# Patient Record
Sex: Male | Born: 1950
Health system: Southern US, Community
[De-identification: ages and names within clinical notes are randomized; demographics above are authoritative.]

## PROBLEM LIST (undated history)

## (undated) DIAGNOSIS — F329 Major depressive disorder, single episode, unspecified: Secondary | ICD-10-CM

## (undated) DIAGNOSIS — F32A Depression, unspecified: Secondary | ICD-10-CM

## (undated) DIAGNOSIS — E785 Hyperlipidemia, unspecified: Secondary | ICD-10-CM

## (undated) DIAGNOSIS — I10 Essential (primary) hypertension: Secondary | ICD-10-CM

## (undated) DIAGNOSIS — C801 Malignant (primary) neoplasm, unspecified: Secondary | ICD-10-CM

## (undated) DIAGNOSIS — E119 Type 2 diabetes mellitus without complications: Secondary | ICD-10-CM

## (undated) HISTORY — DX: Type 2 diabetes mellitus without complications: E11.9

## (undated) HISTORY — DX: Hyperlipidemia, unspecified: E78.5

## (undated) HISTORY — DX: Essential (primary) hypertension: I10

## (undated) HISTORY — DX: Major depressive disorder, single episode, unspecified: F32.9

## (undated) HISTORY — DX: Depression, unspecified: F32.A

## (undated) HISTORY — PX: PROSTATE SURGERY: SHX751

## (undated) HISTORY — DX: Malignant (primary) neoplasm, unspecified: C80.1

---
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
## 2005-05-30 ENCOUNTER — Encounter: Admission: RE | Admit: 2005-05-30 | Discharge: 2005-05-30 | Payer: Self-pay | Admitting: *Deleted

## 2008-01-02 LAB — HM COLONOSCOPY: HM Colonoscopy: NORMAL

## 2008-09-16 ENCOUNTER — Ambulatory Visit: Admission: RE | Admit: 2008-09-16 | Discharge: 2008-11-20 | Payer: Self-pay | Admitting: Radiation Oncology

## 2008-12-11 ENCOUNTER — Encounter (INDEPENDENT_AMBULATORY_CARE_PROVIDER_SITE_OTHER): Payer: Self-pay | Admitting: Urology

## 2008-12-11 ENCOUNTER — Inpatient Hospital Stay (HOSPITAL_COMMUNITY): Admission: RE | Admit: 2008-12-11 | Discharge: 2008-12-12 | Payer: Self-pay | Admitting: Urology

## 2010-01-12 ENCOUNTER — Ambulatory Visit: Payer: Self-pay | Admitting: Internal Medicine

## 2010-01-12 DIAGNOSIS — F329 Major depressive disorder, single episode, unspecified: Secondary | ICD-10-CM

## 2010-01-12 DIAGNOSIS — Z8546 Personal history of malignant neoplasm of prostate: Secondary | ICD-10-CM | POA: Insufficient documentation

## 2010-01-12 DIAGNOSIS — E785 Hyperlipidemia, unspecified: Secondary | ICD-10-CM

## 2010-01-12 DIAGNOSIS — I10 Essential (primary) hypertension: Secondary | ICD-10-CM

## 2010-01-12 DIAGNOSIS — F3289 Other specified depressive episodes: Secondary | ICD-10-CM | POA: Insufficient documentation

## 2010-01-12 LAB — CONVERTED CEMR LAB
Albumin: 3.8 g/dL (ref 3.5–5.2)
BUN: 19 mg/dL (ref 6–23)
Basophils Absolute: 0 10*3/uL (ref 0.0–0.1)
Basophils Relative: 0.6 % (ref 0.0–3.0)
Bilirubin, Direct: 0.1 mg/dL (ref 0.0–0.3)
Calcium: 9.7 mg/dL (ref 8.4–10.5)
Cholesterol: 241 mg/dL — ABNORMAL HIGH (ref 0–200)
Creatinine, Ser: 1 mg/dL (ref 0.4–1.5)
Eosinophils Relative: 2.4 % (ref 0.0–5.0)
GFR calc non Af Amer: 83.13 mL/min (ref >60)
Glucose, Bld: 103 mg/dL — ABNORMAL HIGH (ref 70–99)
HCT: 48.5 % (ref 39.0–52.0)
Lymphocytes Relative: 23.7 % (ref 12.0–46.0)
MCHC: 35.1 g/dL (ref 30.0–36.0)
Neutrophils Relative %: 67.5 % (ref 43.0–77.0)
Platelets: 191 10*3/uL (ref 150.0–400.0)
Total Bilirubin: 1.1 mg/dL (ref 0.3–1.2)
Total CHOL/HDL Ratio: 5
Total Protein: 6.8 g/dL (ref 6.0–8.3)
VLDL: 37.8 mg/dL (ref 0.0–40.0)

## 2010-01-13 ENCOUNTER — Encounter: Payer: Self-pay | Admitting: Internal Medicine

## 2010-04-13 NOTE — Letter (Signed)
Summary: Results Follow-up Letter  [facility]  [address] [place], [place] [address]   Phone: [phone]  Fax: [phone]    01/13/2010  [address] [place], [place]  [address]  Dear Mr. RAN,   The following are the results of your recent test(s):  Test     Result     Blood sugar     very slightly elevated Liver/kidney   normal CBC       normal Thyroid     normal   _________________________________________________________  Please call for an appointment as directed _________________________________________________________ _________________________________________________________ _________________________________________________________  Sincerely,  Sanda Linger MD [facility]

## 2010-04-13 NOTE — Assessment & Plan Note (Signed)
Summary: New / [employer] [facility] / # / cd   Vital Signs:  Patient profile:   60 year old male Height:      70 inches Weight:      202.50 pounds BMI:     29.16 O2 Sat:      97 % on Room air Temp:     98.2 degrees F oral Pulse rate:   80 / minute Pulse rhythm:   regular Resp:     16 per minute BP sitting:   116 / 78  (left arm) Cuff size:   large  Vitals Entered By: Rock Nephew CMA (January 12, 2010 9:17 AM)  Nutrition Counseling: Patient's BMI is greater than 25 and therefore counseled on weight management options.  O2 Flow:  Room air CC: Patient here to establish a PCP, f/up on Htn Is Patient Diabetic? No Pain Assessment Patient in pain? no       Does patient need assistance? Functional Status Self care Ambulation Normal   Primary Care Ilene Witcher:  Etta Grandchild MD  CC:  Patient here to establish a PCP and f/up on Htn.  History of Present Illness:  Follow-Up Visit      This is a 60 year old man who presents for Follow-up visit.  The patient denies chest pain, palpitations, dizziness, syncope, edema, SOB, DOE, PND, and orthopnea.  Since the last visit the patient notes no new problems or concerns.  The patient reports taking meds as prescribed, monitoring BP, and dietary noncompliance.  When questioned about possible medication side effects, the patient notes none.    Preventive Screening-Counseling & Management  Alcohol-Tobacco     Alcohol drinks/day: <1     Alcohol type: beer     >5/day in last 3 mos: no     Alcohol Counseling: not indicated; use of alcohol is not excessive or problematic     Feels need to cut down: no     Feels annoyed by complaints: no     Feels guilty re: drinking: no     Needs 'eye opener' in am: no     Smoking Status: never     Tobacco Counseling: not indicated; no tobacco use  Caffeine-Diet-Exercise     Does Patient Exercise: no     Exercise Counseling: to improve exercise regimen  Hep-HIV-STD-Contraception     Hepatitis Risk: no risk  noted     HIV Risk: no risk noted     STD Risk: no risk noted     Dental Visit-last 6 months yes     Dental Care Counseling: not indicated; dental care within six months     TSE monthly: yes     Testicular SE Education/Counseling to perform regular STE     Sun Exposure-Excessive: no  Safety-Violence-Falls     Seat Belt Use: yes     Helmet Use: n/a     Firearms in the Home: no firearms in the home     Smoke Detectors: yes     Violence in the Home: no risk noted     Sexual Abuse: no      Sexual History:  currently monogamous.        Drug Use:  no.        Blood Transfusions:  no.    Medications Prior to Update: 1)  None  Current Medications (verified): 1)  Diovan Hct 80-12.5 Mg Tabs (Valsartan-Hydrochlorothiazide) .... Take 1 Tablet By Mouth Once A Day  Allergies (verified): No Known Drug Allergies  Past History:  Past Medical History: Prostate cancer, hx of- he saw Dr. Laverle Patter one month ago he says that his exam and PSA were normal. 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  The patient denies anorexia, weight loss, chest pain, syncope, dyspnea on exertion, peripheral edema, prolonged cough, headaches, hemoptysis, abdominal pain, melena, hematochezia, severe indigestion/heartburn, hematuria, suspicious skin lesions, depression, and  testicular masses.    Physical Exam  General:  alert, well-developed, well-nourished, well-hydrated, appropriate dress, normal appearance, healthy-appearing, cooperative to examination, good hygiene, and overweight-appearing.   Head:  normocephalic, atraumatic, no abnormalities observed, no abnormalities palpated, and no alopecia.   Eyes:  vision grossly intact, pupils equal, pupils round, and pupils reactive to light.   Ears:  R ear normal and L ear normal.   Mouth:  Oral mucosa and oropharynx without lesions or exudates.  Teeth in good repair. Neck:  supple, full ROM, no masses, no thyromegaly, no JVD, normal carotid upstroke, no carotid bruits, no cervical lymphadenopathy, and no neck tenderness.   Lungs:  normal respiratory effort, no intercostal retractions, no accessory muscle use, normal breath sounds, no dullness, no fremitus, no crackles, and no wheezes.   Heart:  normal rate, regular rhythm, no murmur, no gallop, and no rub.   Abdomen:  soft, non-tender, normal bowel sounds, no distention, no masses, no guarding, no rigidity, no rebound tenderness, no abdominal hernia, no inguinal hernia, no hepatomegaly, and no splenomegaly.   Rectal:  No external abnormalities noted. Normal sphincter tone. No rectal masses or tenderness. Msk:  No deformity or scoliosis noted of thoracic or lumbar spine.   Pulses:  R and L carotid,radial,femoral,dorsalis pedis and posterior tibial pulses are full and equal bilaterally Extremities:  No clubbing, cyanosis, edema, or deformity noted with normal full range of motion of all joints.   Neurologic:  No cranial nerve deficits noted. Station and gait are normal. Plantar reflexes are down-going bilaterally. DTRs are symmetrical throughout. Sensory, motor and coordinative functions appear intact. Skin:  Intact without suspicious lesions or rashes Cervical Nodes:  no anterior cervical adenopathy and no posterior cervical adenopathy.   Axillary Nodes:  no R axillary  adenopathy and no L axillary adenopathy.   Inguinal Nodes:  no R inguinal adenopathy and no L inguinal adenopathy.   Psych:  Cognition and judgment appear intact. Alert and cooperative with normal attention span and concentration. No apparent delusions, illusions, hallucinations   Impression & Recommendations:  Problem # 1:  HYPERTENSION (ICD-401.9) Assessment Improved  His updated medication list for this problem includes:    Diovan Hct 80-12.5 Mg Tabs (Valsartan-hydrochlorothiazide) .Marland Kitchen... Take 1 tablet by mouth once a day  Orders: Venipuncture ([address]235) TLB-Lipid Panel (80061-LIPID) TLB-BMP (Basic Metabolic Panel-BMET) (80048-METABOL) TLB-CBC Platelet - w/Differential (85025-CBCD) TLB-Hepatic/Liver Function Pnl (80076-HEPATIC) TLB-TSH (Thyroid Stimulating Hormone) (84443-TSH)  BP today: 116/78  Problem # 2:  HYPERLIPIDEMIA (ICD-272.4)  Assessment: Unchanged  Orders: Venipuncture ([address]) TLB-Lipid Panel (80061-LIPID) TLB-BMP (Basic Metabolic Panel-BMET) (80048-METABOL) TLB-CBC Platelet - w/Differential (85025-CBCD) TLB-Hepatic/Liver Function Pnl (80076-HEPATIC) TLB-TSH (Thyroid Stimulating Hormone) (84443-TSH)  Complete Medication List: 1)  Diovan Hct 80-12.5 Mg Tabs (Valsartan-hydrochlorothiazide) .... Take 1 tablet by mouth once a day  Other Orders: Tdap => 74yrs IM (60454) Admin 1st Vaccine (09811)  Colorectal Screening:  Current Recommendations:    Hemoccult: NEG X 1 today  Colonoscopy Results:    Date of Exam: 01/02/2008    Results: Normal  PSA Screening:    Reviewed PSA screening recommendations: patient defers PSA but will reconsider in the future  Immunization & Chemoprophylaxis:    Tetanus vaccine: Tdap  (01/12/2010)  Patient Instructions: 1)  Please schedule a follow-up appointment in 4 months. 2)  It is important that you exercise regularly at least 20 minutes 5 times a week. If you develop chest pain, have severe difficulty breathing, or feel very  tired , stop exercising immediately and seek medical attention. 3)  You need to lose weight. Consider a lower calorie diet and regular exercise.  4)  Check your Blood Pressure regularly. If it is above 140/90: you should make an appointment. Prescriptions: DIOVAN HCT 80-12.5 MG TABS (VALSARTAN-HYDROCHLOROTHIAZIDE) Take 1 tablet by mouth once a day  #90 x 3   Entered and Authorized by:   Etta Grandchild MD   Signed by:   Etta Grandchild MD on 01/12/2010   Method used:   Electronically to        [facility]  [address]  [phone]* (retail)       [address] [place], [place]  [address]       Ph: [phone] or [phone]       Fax: [phone]   RxID:   [id number]    Orders Added: 1)  Venipuncture [38756] 2)  TLB-Lipid Panel [80061-LIPID] 3)  TLB-BMP (Basic Metabolic Panel-BMET) [80048-METABOL] 4)  TLB-CBC Platelet - w/Differential [85025-CBCD] 5)  TLB-Hepatic/Liver Function Pnl [80076-HEPATIC] 6)  TLB-TSH (Thyroid Stimulating Hormone) [84443-TSH] 7)  Tdap => 59yrs IM [90715] 8)  Admin 1st Vaccine [90471] 9)  New Patient Level IV [43329]   Immunizations Administered:  Tetanus Vaccine:    Vaccine Type: Tdap    Site: right deltoid    Mfr: GlaxoSmithKline    Dose: 0.5 ml    Route: IM    Given by: Rock Nephew CMA    Exp. Date: 01/01/2012    Lot #: [id number]    VIS given: 01/30/08 version given January 12, 2010.   Immunizations Administered:  Tetanus Vaccine:    Vaccine Type: Tdap    Site: right deltoid    Mfr: GlaxoSmithKline    Dose: 0.5 ml    Route: IM    Given by: Rock Nephew CMA    Exp. Date: 01/01/2012    Lot #: [id number]    VIS given: 01/30/08 version given January 12, 2010.

## 2010-04-13 NOTE — Letter (Signed)
Summary: Lipid Letter  [facility]  [address] [place], [place] [address]   Phone: [phone]  Fax: [phone]    01/13/2010  [facility] [address] [place], [place]  [address]  Dear Clifford Brown:  We have carefully reviewed your last lipid profile from  and the results are noted below with a summary of recommendations for lipid management.    Cholesterol:       241     Goal: <200   HDL "good" Cholesterol:   32.44     Goal: >40   LDL "bad" Cholesterol:   163     Goal: <130   Triglycerides:       189.0     Goal: <150        TLC Diet (Therapeutic Lifestyle Change): Saturated Fats & Transfatty acids should be kept < 7% of total calories ***Reduce Saturated Fats Polyunstaurated Fat can be up to 10% of total calories Monounsaturated Fat Fat can be up to 20% of total calories Total Fat should be no greater than 25-35% of total calories Carbohydrates should be 50-60% of total calories Protein should be approximately 15% of total calories Fiber should be at least 20-30 grams a day ***Increased fiber may help lower LDL Total Cholesterol should be < 200mg /day Consider adding plant stanol/sterols to diet (example: Benacol spread) ***A higher intake of unsaturated fat may reduce Triglycerides and Increase HDL    Adjunctive Measures (may lower LIPIDS and reduce risk of Heart Attack) include: Aerobic Exercise (20-30 minutes 3-4 times a week) Limit Alcohol Consumption Weight Reduction Aspirin 75-81 mg a day by mouth (if not allergic or contraindicated) Dietary Fiber 20-30 grams a day by mouth     Current Medications: 1)    Diovan Hct 80-12.5 Mg Tabs (Valsartan-hydrochlorothiazide) .... Take 1 tablet by mouth once a day  If you have any questions, please call. We appreciate being able to work with you.   Sincerely,    [facility] Etta Grandchild MD

## 2010-06-17 LAB — HEMOGLOBIN AND HEMATOCRIT, BLOOD
HCT: 35 % — ABNORMAL LOW (ref 39.0–52.0)
Hemoglobin: 11.9 g/dL — ABNORMAL LOW (ref 13.0–17.0)
Hemoglobin: 12.2 g/dL — ABNORMAL LOW (ref 13.0–17.0)

## 2010-06-17 LAB — GLUCOSE, CAPILLARY
Glucose-Capillary: 124 mg/dL — ABNORMAL HIGH (ref 70–99)
Glucose-Capillary: 144 mg/dL — ABNORMAL HIGH (ref 70–99)
Glucose-Capillary: 150 mg/dL — ABNORMAL HIGH (ref 70–99)
Glucose-Capillary: 178 mg/dL — ABNORMAL HIGH (ref 70–99)

## 2010-06-18 LAB — TYPE AND SCREEN
ABO/RH(D): O POS
Antibody Screen: NEGATIVE

## 2010-06-18 LAB — GLUCOSE, CAPILLARY: Glucose-Capillary: 117 mg/dL — ABNORMAL HIGH (ref 70–99)

## 2010-06-18 LAB — CBC
HCT: 44.4 % (ref 39.0–52.0)
MCHC: 34.2 g/dL (ref 30.0–36.0)
RDW: 12.6 % (ref 11.5–15.5)

## 2010-06-18 LAB — BASIC METABOLIC PANEL
CO2: 28 mEq/L (ref 19–32)
Chloride: 104 mEq/L (ref 96–112)
GFR calc Af Amer: >60 mL/min (ref >60)
Glucose, Bld: 119 mg/dL — ABNORMAL HIGH (ref 70–99)
Sodium: 140 mEq/L (ref 135–145)

## 2010-06-18 LAB — ABO/RH: ABO/RH(D): O POS

## 2011-01-25 ENCOUNTER — Other Ambulatory Visit: Payer: Self-pay | Admitting: Internal Medicine

## 2011-01-26 ENCOUNTER — Telehealth: Payer: Self-pay

## 2011-01-26 MED ORDER — VALSARTAN-HYDROCHLOROTHIAZIDE 80-12.5 MG PO TABS: 1.0000 | ORAL_TABLET | Freq: Every day | ORAL | Status: DC

## 2011-01-26 NOTE — Telephone Encounter (Signed)
Refill request

## 2011-01-31 ENCOUNTER — Ambulatory Visit (INDEPENDENT_AMBULATORY_CARE_PROVIDER_SITE_OTHER): Payer: 59 | Admitting: Internal Medicine

## 2011-01-31 ENCOUNTER — Encounter: Payer: Self-pay | Admitting: Internal Medicine

## 2011-01-31 ENCOUNTER — Other Ambulatory Visit (INDEPENDENT_AMBULATORY_CARE_PROVIDER_SITE_OTHER): Payer: 59

## 2011-01-31 ENCOUNTER — Other Ambulatory Visit: Payer: Self-pay | Admitting: Internal Medicine

## 2011-01-31 VITALS — BP 118/80 | HR 77 | Temp 97.7°F | Resp 16 | Ht 70.0 in | Wt 207.4 lb

## 2011-01-31 DIAGNOSIS — E785 Hyperlipidemia, unspecified: Secondary | ICD-10-CM

## 2011-01-31 DIAGNOSIS — Z0000 Encounter for general adult medical examination without abnormal findings: Secondary | ICD-10-CM

## 2011-01-31 DIAGNOSIS — I10 Essential (primary) hypertension: Secondary | ICD-10-CM

## 2011-01-31 LAB — PSA: PSA: 0.01 ng/mL — ABNORMAL LOW (ref 0.10–4.00)

## 2011-01-31 LAB — URINALYSIS, ROUTINE W REFLEX MICROSCOPIC
Bilirubin Urine: NEGATIVE
Ketones, ur: NEGATIVE
Leukocytes, UA: NEGATIVE
Specific Gravity, Urine: 1.02 (ref 1.000–1.030)
Total Protein, Urine: NEGATIVE
Urine Glucose: NEGATIVE
pH: 6.5 (ref 5.0–8.0)

## 2011-01-31 LAB — LDL CHOLESTEROL, DIRECT: Direct LDL: 183.6 mg/dL

## 2011-01-31 LAB — CBC WITH DIFFERENTIAL/PLATELET
Basophils Relative: 0.6 % (ref 0.0–3.0)
Eosinophils Absolute: 0.1 10*3/uL (ref 0.0–0.7)
Eosinophils Relative: 2.6 % (ref 0.0–5.0)
Lymphocytes Relative: 30.1 % (ref 12.0–46.0)
MCHC: 34.9 g/dL (ref 30.0–36.0)
MCV: 89.5 fl (ref 78.0–100.0)
Monocytes Absolute: 0.3 10*3/uL (ref 0.1–1.0)
Monocytes Relative: 5.4 % (ref 3.0–12.0)
Platelets: 187 10*3/uL (ref 150.0–400.0)
RDW: 13.5 % (ref 11.5–14.6)
WBC: 5.2 10*3/uL (ref 4.5–10.5)

## 2011-01-31 LAB — COMPREHENSIVE METABOLIC PANEL
Albumin: 3.9 g/dL (ref 3.5–5.2)
Glucose, Bld: 112 mg/dL — ABNORMAL HIGH (ref 70–99)
Sodium: 140 mEq/L (ref 135–145)

## 2011-01-31 LAB — LIPID PANEL
Total CHOL/HDL Ratio: 5
Triglycerides: 147 mg/dL (ref 0.0–149.0)

## 2011-01-31 LAB — TSH: TSH: 3.16 u[IU]/mL (ref 0.35–5.50)

## 2011-01-31 NOTE — Assessment & Plan Note (Signed)
Exam done, labs ordered, pt ed material was given 

## 2011-01-31 NOTE — Progress Notes (Signed)
  Subjective:    Patient ID: Clifford Brown, male    DOB: 22-Dec-1950, 60 y.o.   MRN: [id number]  HPI He returns for a complete physical and he offers no complaints.   Review of Systems  Constitutional: Negative.   HENT: Negative.   Eyes: Negative.   Respiratory: Negative.   Cardiovascular: Negative.   Gastrointestinal: Negative.   Genitourinary: Negative.   Musculoskeletal: Negative.   Skin: Negative.   Neurological: Negative.   Hematological: Negative.   Psychiatric/Behavioral: Negative.        Objective:   Physical Exam  Vitals reviewed. Constitutional: He is oriented to person, place, and time. He appears well-developed and well-nourished. No distress.  HENT:  Head: Normocephalic and atraumatic.  Mouth/Throat: Oropharynx is clear and moist. No oropharyngeal exudate.  Eyes: Conjunctivae are normal. Right eye exhibits no discharge. Left eye exhibits no discharge. No scleral icterus.  Neck: Normal range of motion. Neck supple. No JVD present. No tracheal deviation present. No thyromegaly present.  Cardiovascular: Normal rate, regular rhythm, normal heart sounds and intact distal pulses.  Exam reveals no gallop and no friction rub.   Pulmonary/Chest: Effort normal and breath sounds normal. No stridor. No respiratory distress. He has no wheezes. He has no rales. He exhibits no tenderness.  Abdominal: Soft. Bowel sounds are normal. He exhibits no distension and no mass. There is no tenderness. There is no rebound and no guarding. Hernia confirmed negative in the right inguinal area and confirmed negative in the left inguinal area.  Genitourinary: Rectum normal, prostate normal, testes normal and penis normal. Rectal exam shows no external hemorrhoid, no internal hemorrhoid, no fissure, no mass, no tenderness and anal tone normal. Guaiac negative stool. Prostate is not enlarged and not tender. Right testis shows no mass, no swelling and no tenderness. Right testis is descended. Left  testis shows no mass, no swelling and no tenderness. Left testis is descended. Uncircumcised. No phimosis, paraphimosis, hypospadias, penile erythema or penile tenderness. No discharge found.  Musculoskeletal: Normal range of motion. He exhibits no edema and no tenderness.  Lymphadenopathy:    He has no cervical adenopathy.       Right: No inguinal adenopathy present.       Left: No inguinal adenopathy present.  Neurological: He is oriented to person, place, and time.  Skin: Skin is warm and dry. No rash noted. He is not diaphoretic. No erythema. No pallor.  Psychiatric: He has a normal mood and affect. His behavior is normal. Judgment and thought content normal.          Assessment & Plan:

## 2011-01-31 NOTE — Patient Instructions (Signed)
Health Maintenance, Males A healthy lifestyle and preventative care can promote health and wellness.  Maintain regular health, dental, and eye exams.   Eat a healthy diet. Foods like vegetables, fruits, whole grains, low-fat dairy products, and lean protein foods contain the nutrients you need without too many calories. Decrease your intake of foods high in solid fats, added sugars, and salt. Get information about a proper diet from your caregiver, if necessary.   Regular physical exercise is one of the most important things you can do for your health. Most adults should get at least 150 minutes of moderate-intensity exercise (any activity that increases your heart rate and causes you to sweat) each week. In addition, most adults need muscle-strengthening exercises on 2 or more days a week.    Maintain a healthy weight. The body mass index (BMI) is a screening tool to identify possible weight problems. It provides an estimate of body fat based on height and weight. Your caregiver can help determine your BMI, and can help you achieve or maintain a healthy weight. For adults 20 years and older:   A BMI below 18.5 is considered underweight.   A BMI of 18.5 to 24.9 is normal.   A BMI of 25 to 29.9 is considered overweight.   A BMI of 30 and above is considered obese.   Maintain normal blood lipids and cholesterol by exercising and minimizing your intake of saturated fat. Eat a balanced diet with plenty of fruits and vegetables. Blood tests for lipids and cholesterol should begin at age 20 and be repeated every 5 years. If your lipid or cholesterol levels are high, you are over 50, or you are a high risk for heart disease, you may need your cholesterol levels checked more frequently.Ongoing high lipid and cholesterol levels should be treated with medicines, if diet and exercise are not effective.   If you smoke, find out from your caregiver how to quit. If you do not use tobacco, do not start.    If you choose to drink alcohol, do not exceed 2 drinks per day. One drink is considered to be 12 ounces (355 mL) of beer, 5 ounces (148 mL) of wine, or 1.5 ounces (44 mL) of liquor.   Avoid use of street drugs. Do not share needles with anyone. Ask for help if you need support or instructions about stopping the use of drugs.   High blood pressure causes heart disease and increases the risk of stroke. Blood pressure should be checked at least every 1 to 2 years. Ongoing high blood pressure should be treated with medicines if weight loss and exercise are not effective.   If you are 45 to 60 years old, ask your caregiver if you should take aspirin to prevent heart disease.   Diabetes screening involves taking a blood sample to check your fasting blood sugar level. This should be done once every 3 years, after age 45, if you are within normal weight and without risk factors for diabetes. Testing should be considered at a younger age or be carried out more frequently if you are overweight and have at least 1 risk factor for diabetes.   Colorectal cancer can be detected and often prevented. Most routine colorectal cancer screening begins at the age of 50 and continues through age 75. However, your caregiver may recommend screening at an earlier age if you have risk factors for colon cancer. On a yearly basis, your caregiver may provide home test kits to check for hidden   blood in the stool. Use of a small camera at the end of a tube, to directly examine the colon (sigmoidoscopy or colonoscopy), can detect the earliest forms of colorectal cancer. Talk to your caregiver about this at age 50, when routine screening begins. Direct examination of the colon should be repeated every 5 to 10 years through age 75, unless early forms of pre-cancerous polyps or small growths are found.   Healthy men should no longer receive prostate-specific antigen (PSA) blood tests as part of routine cancer screening. Consult with  your caregiver about prostate cancer screening.   Practice safe sex. Use condoms and avoid high-risk sexual practices to reduce the spread of sexually transmitted infections (STIs).   Use sunscreen with a sun protection factor (SPF) of 30 or greater. Apply sunscreen liberally and repeatedly throughout the day. You should seek shade when your shadow is shorter than you. Protect yourself by wearing long sleeves, pants, a wide-brimmed hat, and sunglasses year round, whenever you are outdoors.   Notify your caregiver of new moles or changes in moles, especially if there is a change in shape or color. Also notify your caregiver if a mole is larger than the size of a pencil eraser.   A one-time screening for abdominal aortic aneurysm (AAA) and surgical repair of large AAAs by sound wave imaging (ultrasonography) is recommended for ages 65 to 75 years who are current or former smokers.   Stay current with your immunizations.  Document Released: 08/27/2007 Document Revised: 11/10/2010 Document Reviewed: 07/26/2010 [employer] Patient Information 2012 [employer]. 

## 2011-02-01 ENCOUNTER — Encounter: Payer: Self-pay | Admitting: Internal Medicine

## 2011-12-29 ENCOUNTER — Other Ambulatory Visit: Payer: Self-pay | Admitting: Internal Medicine

## 2012-01-26 LAB — HM DIABETES EYE EXAM: HM Diabetic Eye Exam: NORMAL

## 2012-01-26 LAB — HM MAMMOGRAPHY

## 2012-02-02 ENCOUNTER — Other Ambulatory Visit (INDEPENDENT_AMBULATORY_CARE_PROVIDER_SITE_OTHER): Payer: 59

## 2012-02-02 ENCOUNTER — Encounter: Payer: Self-pay | Admitting: Internal Medicine

## 2012-02-02 ENCOUNTER — Ambulatory Visit (INDEPENDENT_AMBULATORY_CARE_PROVIDER_SITE_OTHER): Payer: 59 | Admitting: Internal Medicine

## 2012-02-02 VITALS — BP 104/62 | HR 86 | Temp 97.5°F | Resp 16 | Wt 195.8 lb

## 2012-02-02 DIAGNOSIS — R7309 Other abnormal glucose: Secondary | ICD-10-CM

## 2012-02-02 DIAGNOSIS — E785 Hyperlipidemia, unspecified: Secondary | ICD-10-CM

## 2012-02-02 DIAGNOSIS — E118 Type 2 diabetes mellitus with unspecified complications: Secondary | ICD-10-CM | POA: Insufficient documentation

## 2012-02-02 DIAGNOSIS — Z0000 Encounter for general adult medical examination without abnormal findings: Secondary | ICD-10-CM

## 2012-02-02 DIAGNOSIS — A09 Infectious gastroenteritis and colitis, unspecified: Secondary | ICD-10-CM

## 2012-02-02 DIAGNOSIS — I10 Essential (primary) hypertension: Secondary | ICD-10-CM

## 2012-02-02 DIAGNOSIS — Z8546 Personal history of malignant neoplasm of prostate: Secondary | ICD-10-CM

## 2012-02-02 LAB — CBC WITH DIFFERENTIAL/PLATELET
Eosinophils Absolute: 0.1 10*3/uL (ref 0.0–0.7)
HCT: 46.8 % (ref 39.0–52.0)
Lymphs Abs: 1.3 10*3/uL (ref 0.7–4.0)
MCHC: 33.8 g/dL (ref 30.0–36.0)
MCV: 89.7 fl (ref 78.0–100.0)
Monocytes Absolute: 0.3 10*3/uL (ref 0.1–1.0)
Neutrophils Relative %: 58.8 % (ref 43.0–77.0)
Platelets: 176 10*3/uL (ref 150.0–400.0)

## 2012-02-02 LAB — URINALYSIS, ROUTINE W REFLEX MICROSCOPIC
Bilirubin Urine: NEGATIVE
Hgb urine dipstick: NEGATIVE
Leukocytes, UA: NEGATIVE
Nitrite: NEGATIVE
Urobilinogen, UA: 0.2 (ref 0.0–1.0)
pH: 5.5 (ref 5.0–8.0)

## 2012-02-02 LAB — COMPREHENSIVE METABOLIC PANEL
ALT: 54 U/L — ABNORMAL HIGH (ref 0–53)
AST: 29 U/L (ref 0–37)
Albumin: 3.6 g/dL (ref 3.5–5.2)
Alkaline Phosphatase: 127 U/L — ABNORMAL HIGH (ref 39–117)
Potassium: 4 mEq/L (ref 3.5–5.1)
Sodium: 135 mEq/L (ref 135–145)
Total Bilirubin: 0.6 mg/dL (ref 0.3–1.2)
Total Protein: 6.6 g/dL (ref 6.0–8.3)

## 2012-02-02 LAB — LIPID PANEL
Cholesterol: 184 mg/dL (ref 0–200)
LDL Cholesterol: 119 mg/dL — ABNORMAL HIGH (ref 0–99)
Total CHOL/HDL Ratio: 5

## 2012-02-02 LAB — TSH: TSH: 5.36 u[IU]/mL (ref 0.35–5.50)

## 2012-02-02 LAB — HEMOGLOBIN A1C: Hgb A1c MFr Bld: 9.5 % — ABNORMAL HIGH (ref 4.6–6.5)

## 2012-02-02 MED ORDER — METRONIDAZOLE 500 MG PO TABS: 500.0000 mg | ORAL_TABLET | Freq: Three times a day (TID) | ORAL | Status: DC

## 2012-02-02 NOTE — Assessment & Plan Note (Signed)
Exam done Vaccines were reviewed Labs ordered Pt ed material was given 

## 2012-02-02 NOTE — Assessment & Plan Note (Signed)
He does not look toxic or dehydrated, I will check his CBC today to see if the WBC is elevated, will try flagyl to treat bacterial causes and c diff

## 2012-02-02 NOTE — Patient Instructions (Signed)
Health Maintenance, Males A healthy lifestyle and preventative care can promote health and wellness.  Maintain regular health, dental, and eye exams.  Eat a healthy diet. Foods like vegetables, fruits, whole grains, low-fat dairy products, and lean protein foods contain the nutrients you need without too many calories. Decrease your intake of foods high in solid fats, added sugars, and salt. Get information about a proper diet from your caregiver, if necessary.  Regular physical exercise is one of the most important things you can do for your health. Most adults should get at least 150 minutes of moderate-intensity exercise (any activity that increases your heart rate and causes you to sweat) each week. In addition, most adults need muscle-strengthening exercises on 2 or more days a week.   Maintain a healthy weight. The body mass index (BMI) is a screening tool to identify possible weight problems. It provides an estimate of body fat based on height and weight. Your caregiver can help determine your BMI, and can help you achieve or maintain a healthy weight. For adults 20 years and older:  A BMI below 18.5 is considered underweight.  A BMI of 18.5 to 24.9 is normal.  A BMI of 25 to 29.9 is considered overweight.  A BMI of 30 and above is considered obese.  Maintain normal blood lipids and cholesterol by exercising and minimizing your intake of saturated fat. Eat a balanced diet with plenty of fruits and vegetables. Blood tests for lipids and cholesterol should begin at age 20 and be repeated every 5 years. If your lipid or cholesterol levels are high, you are over 50, or you are a high risk for heart disease, you may need your cholesterol levels checked more frequently.Ongoing high lipid and cholesterol levels should be treated with medicines, if diet and exercise are not effective.  If you smoke, find out from your caregiver how to quit. If you do not use tobacco, do not start.  If you  choose to drink alcohol, do not exceed 2 drinks per day. One drink is considered to be 12 ounces (355 mL) of beer, 5 ounces (148 mL) of wine, or 1.5 ounces (44 mL) of liquor.  Avoid use of street drugs. Do not share needles with anyone. Ask for help if you need support or instructions about stopping the use of drugs.  High blood pressure causes heart disease and increases the risk of stroke. Blood pressure should be checked at least every 1 to 2 years. Ongoing high blood pressure should be treated with medicines if weight loss and exercise are not effective.  If you are 45 to 61 years old, ask your caregiver if you should take aspirin to prevent heart disease.  Diabetes screening involves taking a blood sample to check your fasting blood sugar level. This should be done once every 3 years, after age 45, if you are within normal weight and without risk factors for diabetes. Testing should be considered at a younger age or be carried out more frequently if you are overweight and have at least 1 risk factor for diabetes.  Colorectal cancer can be detected and often prevented. Most routine colorectal cancer screening begins at the age of 50 and continues through age 75. However, your caregiver may recommend screening at an earlier age if you have risk factors for colon cancer. On a yearly basis, your caregiver may provide home test kits to check for hidden blood in the stool. Use of a small camera at the end of a tube,   to directly examine the colon (sigmoidoscopy or colonoscopy), can detect the earliest forms of colorectal cancer. Talk to your caregiver about this at age 7, when routine screening begins. Direct examination of the colon should be repeated every 5 to 10 years through age 60, unless early forms of pre-cancerous polyps or small growths are found.  Hepatitis C blood testing is recommended for all people born from [birth year] through 1965 and any individual with known risks for hepatitis C.  Healthy  men should no longer receive prostate-specific antigen (PSA) blood tests as part of routine cancer screening. Consult with your caregiver about prostate cancer screening.  Testicular cancer screening is not recommended for adolescents or adult males who have no symptoms. Screening includes self-exam, caregiver exam, and other screening tests. Consult with your caregiver about any symptoms you have or any concerns you have about testicular cancer.  Practice safe sex. Use condoms and avoid high-risk sexual practices to reduce the spread of sexually transmitted infections (STIs).  Use sunscreen with a sun protection factor (SPF) of 30 or greater. Apply sunscreen liberally and repeatedly throughout the day. You should seek shade when your shadow is shorter than you. Protect yourself by wearing long sleeves, pants, a wide-brimmed hat, and sunglasses year round, whenever you are outdoors.  Notify your caregiver of new moles or changes in moles, especially if there is a change in shape or color. Also notify your caregiver if a mole is larger than the size of a pencil eraser.  A one-time screening for abdominal aortic aneurysm (AAA) and surgical repair of large AAAs by sound wave imaging (ultrasonography) is recommended for ages 18 to 18 years who are current or former smokers.  Stay current with your immunizations. Document Released: 08/27/2007 Document Revised: 05/23/2011 Document Reviewed: 07/26/2010 [facility] Patient Information 2013 [place], [place]. Diarrhea Infections caused by germs (bacterial) or a virus commonly cause diarrhea. Your caregiver has determined that with time, rest and fluids, the diarrhea should improve. In general, eat normally while drinking more water than usual. Although water may prevent dehydration, it does not contain salt and minerals (electrolytes). Broths, weak tea without caffeine and oral rehydration solutions (ORS) replace fluids and electrolytes. Small amounts of fluids  should be taken frequently. Large amounts at one time may not be tolerated. Plain water may be harmful in infants and the elderly. Oral rehydrating solutions (ORS) are available at pharmacies and grocery stores. ORS replace water and important electrolytes in proper proportions. Sports drinks are not as effective as ORS and may be harmful due to sugars worsening diarrhea.  ORS is especially recommended for use in children with diarrhea. As a general guideline for children, replace any new fluid losses from diarrhea and/or vomiting with ORS as follows:  If your child weighs 22 pounds or under (10 kg or less), give 60-120 mL ( -  cup or 2 - 4 ounces) of ORS for each episode of diarrheal stool or vomiting episode.  If your child weighs more than 22 pounds (more than 10 kgs), give 120-240 mL ( - 1 cup or 4 - 8 ounces) of ORS for each diarrheal stool or episode of vomiting.  While correcting for dehydration, children should eat normally. However, foods high in sugar should be avoided because this may worsen diarrhea. Large amounts of carbonated soft drinks, juice, gelatin desserts and other highly sugared drinks should be avoided.  After correction of dehydration, other liquids that are appealing to the child may be added. Children should drink small amounts  of fluids frequently and fluids should be increased as tolerated. Children should drink enough fluids to keep urine clear or pale yellow.  Adults should eat normally while drinking more fluids than usual. Drink small amounts of fluids frequently and increase as tolerated. Drink enough fluids to keep urine clear or pale yellow. Broths, weak decaffeinated tea, lemon lime soft drinks (allowed to go flat) and ORS replace fluids and electrolytes.  Avoid:  Carbonated drinks.  Juice.  Extremely hot or cold fluids.  Caffeine drinks.  Fatty, greasy foods.  Alcohol.  Tobacco.  Too much intake of anything at one time.  Gelatin  desserts.  Probiotics are active cultures of beneficial bacteria. They may lessen the amount and number of diarrheal stools in adults. Probiotics can be found in yogurt with active cultures and in supplements.  Wash hands well to avoid spreading bacteria and virus.  Anti-diarrheal medications are not recommended for infants and children.  Only take over-the-counter or prescription medicines for pain, discomfort or fever as directed by your caregiver. Do not give aspirin to children because it may cause Reye's Syndrome.  For adults, ask your caregiver if you should continue all prescribed and over-the-counter medicines.  If your caregiver has given you a follow-up appointment, it is very important to keep that appointment. Not keeping the appointment could result in a chronic or permanent injury, and disability. If there is any problem keeping the appointment, you must call back to this facility for assistance. SEEK IMMEDIATE MEDICAL CARE IF:   You or your child is unable to keep fluids down or other symptoms or problems become worse in spite of treatment.  Vomiting or diarrhea develops and becomes persistent.  There is vomiting of blood or bile (green material).  There is blood in the stool or the stools are black and tarry.  There is no urine output in 6-8 hours or there is only a small amount of very dark urine.  Abdominal pain develops, increases or localizes.  You have a fever.  Your baby is older than 3 months with a rectal temperature of 102 F (38.9 C) or higher.  Your baby is 38 months old or younger with a rectal temperature of 100.4 F (38 C) or higher.  You or your child develops excessive weakness, dizziness, fainting or extreme thirst.  You or your child develops a rash, stiff neck, severe headache or become irritable or sleepy and difficult to awaken. MAKE SURE YOU:   Understand these instructions.  Will watch your condition.  Will get help right away if you  are not doing well or get worse. Document Released: 02/18/2002 Document Revised: 05/23/2011 Document Reviewed: 01/05/2009 [facility] Patient Information 2013 [address], [place].

## 2012-02-02 NOTE — Progress Notes (Signed)
Subjective:    Patient ID: Clifford Brown, male    DOB: Feb 08, 1951, 60 y.o.   MRN: [id number]  Diarrhea  This is a new problem. The current episode started in the past 7 days. The problem occurs 2 to 4 times per day. The problem has been unchanged. The stool consistency is described as blood tinged, mucous and watery. Associated symptoms include chills and increased flatus. Pertinent negatives include no abdominal pain, arthralgias, bloating, coughing, fever, headaches, myalgias, sweats, URI, vomiting or weight loss. Nothing aggravates the symptoms. He has tried nothing for the symptoms.      Review of Systems  Constitutional: Positive for chills. Negative for fever, weight loss, diaphoresis, activity change, appetite change, fatigue and unexpected weight change.  HENT: Negative.   Eyes: Negative.   Respiratory: Negative for apnea, cough, choking, chest tightness, shortness of breath, wheezing and stridor.   Cardiovascular: Negative for chest pain, palpitations and leg swelling.  Gastrointestinal: Positive for nausea, diarrhea and flatus. Negative for vomiting, abdominal pain, constipation, blood in stool, abdominal distention, anal bleeding, rectal pain and bloating.  Genitourinary: Negative.   Musculoskeletal: Negative for myalgias, back pain, joint swelling, arthralgias and gait problem.  Skin: Negative for color change, pallor, rash and wound.  Neurological: Negative for dizziness, tremors, seizures, syncope, facial asymmetry, speech difficulty, weakness, light-headedness, numbness and headaches.  Hematological: Negative for adenopathy. Does not bruise/bleed easily.  Psychiatric/Behavioral: Negative.        Objective:   Physical Exam  Vitals reviewed. Constitutional: He is oriented to person, place, and time. He appears well-developed and well-nourished.  Non-toxic appearance. He does not have a sickly appearance. He does not appear ill. No distress.  HENT:  Head: Normocephalic and  atraumatic.  Mouth/Throat: Oropharynx is clear and moist. No oropharyngeal exudate.  Eyes: Conjunctivae normal are normal. Right eye exhibits no discharge. Left eye exhibits no discharge. No scleral icterus.  Neck: Normal range of motion. Neck supple. No JVD present. No tracheal deviation present. No thyromegaly present.  Cardiovascular: Normal rate, regular rhythm, normal heart sounds and intact distal pulses.  Exam reveals no gallop and no friction rub.   No murmur heard. Pulmonary/Chest: Effort normal and breath sounds normal. No stridor. No respiratory distress. He has no wheezes. He has no rales. He exhibits no tenderness.  Abdominal: Soft. Bowel sounds are normal. He exhibits no distension and no mass. There is no tenderness. There is no rebound and no guarding.  Musculoskeletal: Normal range of motion. He exhibits no edema and no tenderness.  Lymphadenopathy:    He has no cervical adenopathy.  Neurological: He is oriented to person, place, and time.  Skin: Skin is warm and dry. No rash noted. He is not diaphoretic. No erythema. No pallor.  Psychiatric: He has a normal mood and affect. His behavior is normal. Judgment and thought content normal.     Lab Results  Component Value Date   WBC 5.2 01/31/2011   HGB 16.4 01/31/2011   HCT 47.2 01/31/2011   PLT 187.0 01/31/2011   GLUCOSE 112* 01/31/2011   CHOL 245* 01/31/2011   TRIG 147.0 01/31/2011   HDL 51.70 01/31/2011   LDLDIRECT 183.6 01/31/2011   ALT 47 01/31/2011   AST 23 01/31/2011   NA 140 01/31/2011   K 4.2 01/31/2011   CL 104 01/31/2011   CREATININE 1.1 01/31/2011   BUN 12 01/31/2011   CO2 27 01/31/2011   TSH 3.16 01/31/2011   PSA 0.01* 01/31/2011       Assessment &  Plan:

## 2012-02-02 NOTE — Assessment & Plan Note (Signed)
FLP today 

## 2012-02-02 NOTE — Assessment & Plan Note (Signed)
His BP is well controlled, I will check his lytes and renal function 

## 2012-02-02 NOTE — Assessment & Plan Note (Signed)
I will check his a1c to see if he has developed DM II 

## 2012-02-02 NOTE — Assessment & Plan Note (Signed)
I will check his PSA today 

## 2012-02-03 ENCOUNTER — Encounter: Payer: Self-pay | Admitting: Internal Medicine

## 2012-02-03 ENCOUNTER — Ambulatory Visit (INDEPENDENT_AMBULATORY_CARE_PROVIDER_SITE_OTHER): Payer: 59 | Admitting: Internal Medicine

## 2012-02-03 ENCOUNTER — Other Ambulatory Visit (INDEPENDENT_AMBULATORY_CARE_PROVIDER_SITE_OTHER): Payer: 59

## 2012-02-03 VITALS — BP 100/64 | HR 85 | Temp 97.6°F | Resp 16 | Wt 196.0 lb

## 2012-02-03 DIAGNOSIS — I10 Essential (primary) hypertension: Secondary | ICD-10-CM

## 2012-02-03 DIAGNOSIS — IMO0001 Reserved for inherently not codable concepts without codable children: Secondary | ICD-10-CM

## 2012-02-03 DIAGNOSIS — E785 Hyperlipidemia, unspecified: Secondary | ICD-10-CM

## 2012-02-03 DIAGNOSIS — A09 Infectious gastroenteritis and colitis, unspecified: Secondary | ICD-10-CM

## 2012-02-03 LAB — BASIC METABOLIC PANEL
Calcium: 9.3 mg/dL (ref 8.4–10.5)
Creatinine, Ser: 1 mg/dL (ref 0.4–1.5)
GFR: 78.83 mL/min (ref >60.00)
Sodium: 135 mEq/L (ref 135–145)

## 2012-02-03 MED ORDER — ROSUVASTATIN CALCIUM 10 MG PO TABS: 10.0000 mg | ORAL_TABLET | Freq: Every day | ORAL | Status: DC

## 2012-02-03 MED ORDER — SITAGLIP PHOS-METFORMIN HCL ER 100-1000 MG PO TB24: 1.0000 | ORAL_TABLET | Freq: Every day | ORAL | Status: DC

## 2012-02-03 NOTE — Assessment & Plan Note (Signed)
Start Crestor ?

## 2012-02-03 NOTE — Assessment & Plan Note (Signed)
This has resolved.

## 2012-02-03 NOTE — Assessment & Plan Note (Signed)
His BP is well controlled 

## 2012-02-03 NOTE — Assessment & Plan Note (Signed)
Start Janumet-XR Check a c-peptide today to see if he produces insulin (ie. Does he need to be treated with insulin) Refer for diabetic education

## 2012-02-03 NOTE — Progress Notes (Signed)
Subjective:    Patient ID: Clifford Brown, male    DOB: 1950-12-05, 61 y.o.   MRN: [id number]  Diabetes He presents for his initial diabetic visit. He has type 2 diabetes mellitus. His disease course has been worsening. There are no hypoglycemic associated symptoms. Pertinent negatives for hypoglycemia include no dizziness. Associated symptoms include polydipsia, polyphagia and polyuria. Pertinent negatives for diabetes include no blurred vision, no chest pain, no fatigue, no foot paresthesias, no foot ulcerations, no visual change, no weakness and no weight loss. There are no hypoglycemic complications. Symptoms are worsening. Symptoms have been present for 3 months. There are no diabetic complications. When asked about current treatments, none were reported. He is compliant with treatment none of the time. His weight is stable. He is following a generally unhealthy diet. When asked about meal planning, he reported none. He has not had a previous visit with a dietician. He participates in exercise intermittently. His home blood glucose trend is increasing steadily. An ACE inhibitor/angiotensin II receptor blocker is being taken. He does not see a podiatrist.Eye exam is current.  Hyperlipidemia This is a new problem. This is a new diagnosis. The problem is uncontrolled. Recent lipid tests were reviewed and are variable. Exacerbating diseases include diabetes and obesity. He has no history of chronic renal disease, hypothyroidism, liver disease or nephrotic syndrome. Factors aggravating his hyperlipidemia include thiazides and fatty foods. Pertinent negatives include no chest pain, focal sensory loss, focal weakness, leg pain, myalgias or shortness of breath. He is currently on no antihyperlipidemic treatment. The current treatment provides no improvement of lipids. Compliance problems include adherence to diet and adherence to exercise.  Risk factors for coronary artery disease include diabetes mellitus,  hypertension, male sex, obesity and stress.      Review of Systems  Constitutional: Negative for fever, chills, weight loss, diaphoresis, activity change, appetite change, fatigue and unexpected weight change.  HENT: Negative.   Eyes: Negative.  Negative for blurred vision.  Respiratory: Negative for cough, chest tightness, shortness of breath, wheezing and stridor.   Cardiovascular: Negative for chest pain, palpitations and leg swelling.  Gastrointestinal: Negative for nausea, vomiting, abdominal pain, diarrhea and constipation.  Genitourinary: Positive for polyuria.  Musculoskeletal: Negative.  Negative for myalgias.  Skin: Negative.   Neurological: Negative.  Negative for dizziness, focal weakness, syncope, weakness and light-headedness.  Hematological: Positive for polydipsia and polyphagia. Negative for adenopathy. Does not bruise/bleed easily.  Psychiatric/Behavioral: Negative.        Objective:   Physical Exam  Vitals reviewed. Constitutional: He is oriented to person, place, and time. He appears well-developed and well-nourished.  Non-toxic appearance. He does not have a sickly appearance. He does not appear ill. No distress.  HENT:  Head: Normocephalic and atraumatic.  Mouth/Throat: Oropharynx is clear and moist. No oropharyngeal exudate.  Eyes: Conjunctivae normal are normal. Right eye exhibits no discharge. Left eye exhibits no discharge. No scleral icterus.  Neck: Normal range of motion. Neck supple. No JVD present. No tracheal deviation present. No thyromegaly present.  Cardiovascular: Normal rate, regular rhythm, normal heart sounds and intact distal pulses.  Exam reveals no gallop and no friction rub.   No murmur heard. Pulmonary/Chest: Effort normal and breath sounds normal. No stridor. No respiratory distress. He has no wheezes. He has no rales. He exhibits no tenderness.  Abdominal: Soft. Bowel sounds are normal. He exhibits no distension and no mass. There is no  tenderness. There is no rebound and no guarding.  Musculoskeletal: Normal range of  motion. He exhibits no edema and no tenderness.  Lymphadenopathy:    He has no cervical adenopathy.  Neurological: He is oriented to person, place, and time.  Skin: Skin is warm and dry. No rash noted. He is not diaphoretic. No erythema. No pallor.  Psychiatric: He has a normal mood and affect. His behavior is normal. Judgment and thought content normal.      Lab Results  Component Value Date   WBC 4.1* 02/02/2012   HGB 15.8 02/02/2012   HCT 46.8 02/02/2012   PLT 176.0 02/02/2012   GLUCOSE 257* 02/02/2012   CHOL 184 02/02/2012   TRIG 130.0 02/02/2012   HDL 39.30 02/02/2012   LDLDIRECT 183.6 01/31/2011   LDLCALC 119* 02/02/2012   ALT 54* 02/02/2012   AST 29 02/02/2012   NA 135 02/02/2012   K 4.0 02/02/2012   CL 100 02/02/2012   CREATININE 1.0 02/02/2012   BUN 17 02/02/2012   CO2 27 02/02/2012   TSH 5.36 02/02/2012   PSA 0.01* 02/02/2012   HGBA1C 9.5* 02/02/2012      Assessment & Plan:

## 2012-02-03 NOTE — Patient Instructions (Signed)

## 2012-02-05 ENCOUNTER — Encounter: Payer: Self-pay | Admitting: Internal Medicine

## 2012-02-06 ENCOUNTER — Telehealth: Payer: Self-pay

## 2012-02-06 NOTE — Telephone Encounter (Signed)
ok 

## 2012-02-06 NOTE — Telephone Encounter (Signed)
Patient called Clifford Brown stating that he needs date for back to work letter corrected. Original date states 02/13/12 but request that this is changed to 02/15/12. Please advise if ok Thanks

## 2012-02-06 NOTE — Telephone Encounter (Signed)
Letter updated , pt notified and will pick up

## 2012-02-07 ENCOUNTER — Ambulatory Visit: Payer: 59 | Admitting: Internal Medicine

## 2012-02-07 ENCOUNTER — Telehealth: Payer: Self-pay

## 2012-02-07 NOTE — Telephone Encounter (Signed)
Patient called back stating that he is on a rotating swing shift and misread his calender for back to work. He now states that he is to return to work 02/16/12.

## 2012-02-10 ENCOUNTER — Other Ambulatory Visit: Payer: Self-pay | Admitting: Internal Medicine

## 2012-02-12 ENCOUNTER — Other Ambulatory Visit: Payer: Self-pay | Admitting: Internal Medicine

## 2012-03-01 ENCOUNTER — Encounter: Payer: Self-pay | Admitting: *Deleted

## 2012-03-01 ENCOUNTER — Encounter: Payer: 59 | Attending: Internal Medicine | Admitting: *Deleted

## 2012-03-01 VITALS — Ht 70.0 in | Wt 191.9 lb

## 2012-03-01 DIAGNOSIS — Z713 Dietary counseling and surveillance: Secondary | ICD-10-CM | POA: Insufficient documentation

## 2012-03-01 DIAGNOSIS — E119 Type 2 diabetes mellitus without complications: Secondary | ICD-10-CM | POA: Insufficient documentation

## 2012-03-01 NOTE — Progress Notes (Signed)
  Medical Nutrition Therapy:  Appt start time: 0730 end time:  0830.  Assessment:  Primary concerns today: newly diagnosed with DM 2 about 2 weeks ago. He lives with his wife and her 61 yo son, she does any of the meal prep  Although they eat out often. He enjoys walking his dogs and enjoys traveling. He works 12 hour swing shifts at [employer], which makes a regular schedule impossible. He has started checking his BG twice daily with range of 89-244, current average is now in the low 100's. MEDICATIONS: see list   DIETARY INTAKE:  Usual eating pattern includes 3 meals and 0-2 snacks per day.  Everyday foods include fair variety of all food groups.  Avoided foods include regular sodas and sweet tea.    24-hr recall:  B ( AM): yogurt at work when working days, OR oatmeal with butter and Splenda or brown sugar, 8 oz  whole milk  Snk ( AM): none  L ( PM): bring from home; left overs OR sandwich or soup, flavored water Snk ( PM): none D ( PM): eat out often- pizza OR chinese OR casseroles OR meat and starches Snk ( PM): occasionally cheese and crackers after work, then no dinner Beverages: flavored water, whole milk  Usual physical activity: has been walking dogs more lately, takes steps quite a bit at home too  Estimated energy needs: 1600 calories 180 g carbohydrates 120 g protein 44 g fat  Progress Towards Goal(s):  In progress.   Nutritional Diagnosis:  NB-1.1 Food and nutrition-related knowledge deficit As related to new diagnosis of diabetes.  As evidenced by A1c of 9.5%.    Intervention:  Nutrition counseling and diabetes education initiated. Discussed basic physiology of diabetes, SMBG and rationale of checking BG at alternate times of day, A1c, Carb Counting and reading food labels, and benefits of increased activity.   Plan: Aim for 3-4 Carbs per meal (45-60 grams)  Aim for 0-2 Carbs per snack if hungry Consider reading food labels for nutrition resource Continue  with walking dog regularly Continue checking BG daily as directed by MD, continue checking at alternate times of day  Handouts given during visit include: Living Well with Diabetes Carb Counting and Food Label handouts Meal Plan Card  Diabetes Medication List  Monitoring/Evaluation:  Dietary intake, exercise, reading food labels, and body weight prn.

## 2012-03-01 NOTE — Patient Instructions (Addendum)
Plan: Aim for 3-4 Carbs per meal (45-60 grams)  Aim for 0-2 Carbs per snack if hungry Consider reading food labels for nutrition resource Continue with walking dog regularly Continue checking BG daily as directed by MD, continue checking at alternate times of day

## 2012-03-14 ENCOUNTER — Other Ambulatory Visit: Payer: Self-pay | Admitting: Internal Medicine

## 2012-03-23 ENCOUNTER — Telehealth: Payer: Self-pay | Admitting: Internal Medicine

## 2012-03-23 NOTE — Telephone Encounter (Signed)
Patient left message on triage stating he needs a letter written by the doctor explaining his diagnosis, treatment plan, and why he was taken out of work for the illness, please Fax to [phone]

## 2012-03-23 NOTE — Telephone Encounter (Signed)
I wrote him a work note because he asked for one

## 2012-03-26 NOTE — Telephone Encounter (Signed)
Pt informed, scheduled appointment for follow up with Dr. Yetta Barre

## 2012-03-30 ENCOUNTER — Encounter: Payer: Self-pay | Admitting: Internal Medicine

## 2012-03-30 ENCOUNTER — Ambulatory Visit (INDEPENDENT_AMBULATORY_CARE_PROVIDER_SITE_OTHER): Payer: 59 | Admitting: Internal Medicine

## 2012-03-30 VITALS — BP 96/60 | HR 87 | Temp 97.5°F | Resp 16 | Wt 189.0 lb

## 2012-03-30 DIAGNOSIS — M533 Sacrococcygeal disorders, not elsewhere classified: Secondary | ICD-10-CM

## 2012-03-30 DIAGNOSIS — IMO0001 Reserved for inherently not codable concepts without codable children: Secondary | ICD-10-CM

## 2012-03-30 DIAGNOSIS — M545 Low back pain, unspecified: Secondary | ICD-10-CM

## 2012-03-30 DIAGNOSIS — I10 Essential (primary) hypertension: Secondary | ICD-10-CM

## 2012-03-30 DIAGNOSIS — E785 Hyperlipidemia, unspecified: Secondary | ICD-10-CM

## 2012-03-30 NOTE — Progress Notes (Signed)
Subjective:    Patient ID: Clifford Brown, male    DOB: 10-Jun-1950, 62 y.o.   MRN: [id number]  Back Pain This is a new problem. The current episode started 1 to 4 weeks ago. The problem occurs intermittently. The problem has been gradually improving since onset. The pain is present in the lumbar spine. The quality of the pain is described as aching ("tightness"). The pain does not radiate. The pain is at a severity of 2/10. The pain is mild. The pain is worse during the day. The symptoms are aggravated by position and bending. Stiffness is present in the morning. Pertinent negatives include no abdominal pain, bladder incontinence, bowel incontinence, chest pain, dysuria, fever, headaches, leg pain, numbness, paresis, paresthesias, pelvic pain, perianal numbness, tingling, weakness or weight loss. Risk factors include obesity. He has tried NSAIDs for the symptoms. The treatment provided moderate relief.  Hypertension This is a chronic problem. The current episode started more than 1 year ago. The problem has been rapidly improving since onset. The problem is controlled. Pertinent negatives include no anxiety, blurred vision, chest pain, headaches, malaise/fatigue, neck pain, orthopnea, palpitations, peripheral edema, PND, shortness of breath or sweats. Agents associated with hypertension include NSAIDs. Past treatments include angiotensin blockers and diuretics. Compliance problems include exercise and diet.       Review of Systems  Constitutional: Negative for fever, chills, weight loss, malaise/fatigue, diaphoresis, activity change, appetite change, fatigue and unexpected weight change.  HENT: Negative.  Negative for neck pain.   Eyes: Negative.  Negative for blurred vision.  Respiratory: Negative for choking, chest tightness, shortness of breath, wheezing and stridor.   Cardiovascular: Negative for chest pain, palpitations, orthopnea, leg swelling and PND.  Gastrointestinal: Negative for nausea,  vomiting, abdominal pain, diarrhea, constipation and bowel incontinence.  Genitourinary: Negative.  Negative for bladder incontinence, dysuria and pelvic pain.  Musculoskeletal: Positive for back pain. Negative for myalgias, joint swelling, arthralgias and gait problem.  Skin: Negative.   Neurological: Positive for dizziness and light-headedness. Negative for tingling, tremors, seizures, syncope, facial asymmetry, speech difficulty, weakness, numbness, headaches and paresthesias.  Hematological: Negative for adenopathy. Does not bruise/bleed easily.  Psychiatric/Behavioral: Negative.        Objective:   Physical Exam  Vitals reviewed. Constitutional: He is oriented to person, place, and time. He appears well-developed and well-nourished. No distress.  HENT:  Head: Normocephalic and atraumatic.  Mouth/Throat: Oropharynx is clear and moist. No oropharyngeal exudate.  Eyes: Conjunctivae normal are normal. Right eye exhibits no discharge. Left eye exhibits no discharge. No scleral icterus.  Neck: Normal range of motion. Neck supple. No JVD present. No tracheal deviation present. No thyromegaly present.  Cardiovascular: Normal rate, regular rhythm, normal heart sounds and intact distal pulses.  Exam reveals no gallop and no friction rub.   No murmur heard. Pulmonary/Chest: Effort normal and breath sounds normal. No stridor. No respiratory distress. He has no wheezes. He has no rales. He exhibits no tenderness.  Abdominal: Soft. Bowel sounds are normal. He exhibits no distension and no mass. There is no tenderness. There is no rebound and no guarding.  Musculoskeletal: Normal range of motion. He exhibits no edema and no tenderness.  Lymphadenopathy:    He has no cervical adenopathy.  Neurological: He is oriented to person, place, and time.  Skin: Skin is warm and dry. No rash noted. He is not diaphoretic. No erythema. No pallor.  Psychiatric: He has a normal mood and affect. His behavior is  normal. Judgment and thought content  normal.     Lab Results  Component Value Date   WBC 4.1* 02/02/2012   HGB 15.8 02/02/2012   HCT 46.8 02/02/2012   PLT 176.0 02/02/2012   GLUCOSE 213* 02/03/2012   CHOL 184 02/02/2012   TRIG 130.0 02/02/2012   HDL 39.30 02/02/2012   LDLDIRECT 183.6 01/31/2011   LDLCALC 119* 02/02/2012   ALT 54* 02/02/2012   AST 29 02/02/2012   NA 135 02/03/2012   K 3.7 02/03/2012   CL 100 02/03/2012   CREATININE 1.0 02/03/2012   BUN 17 02/03/2012   CO2 28 02/03/2012   TSH 5.36 02/02/2012   PSA 0.01* 02/02/2012   HGBA1C 9.5* 02/02/2012       Assessment & Plan:

## 2012-03-30 NOTE — Patient Instructions (Signed)
Back Pain, Adult Low back pain is very common. About 1 in 5 people have back pain.The cause of low back pain is rarely dangerous. The pain often gets better over time.About half of people with a sudden onset of back pain feel better in just 2 weeks. About 8 in 10 people feel better by 6 weeks.  CAUSES Some common causes of back pain include:  Strain of the muscles or ligaments supporting the spine.  Wear and tear (degeneration) of the spinal discs.  Arthritis.  Direct injury to the back. DIAGNOSIS Most of the time, the direct cause of low back pain is not known.However, back pain can be treated effectively even when the exact cause of the pain is unknown.Answering your caregiver's questions about your overall health and symptoms is one of the most accurate ways to make sure the cause of your pain is not dangerous. If your caregiver needs more information, he or she may order lab work or imaging tests (X-rays or MRIs).However, even if imaging tests show changes in your back, this usually does not require surgery. HOME CARE INSTRUCTIONS For many people, back pain returns.Since low back pain is rarely dangerous, it is often a condition that people can learn to manageon their own.   Remain active. It is stressful on the back to sit or stand in one place. Do not sit, drive, or stand in one place for more than 30 minutes at a time. Take short walks on level surfaces as soon as pain allows.Try to increase the length of time you walk each day.  Do not stay in bed.Resting more than 1 or 2 days can delay your recovery.  Do not avoid exercise or work.Your body is made to move.It is not dangerous to be active, even though your back may hurt.Your back will likely heal faster if you return to being active before your pain is gone.  Pay attention to your body when you bend and lift. Many people have less discomfortwhen lifting if they bend their knees, keep the load close to their bodies,and  avoid twisting. Often, the most comfortable positions are those that put less stress on your recovering back.  Find a comfortable position to sleep. Use a firm mattress and lie on your side with your knees slightly bent. If you lie on your back, put a pillow under your knees.  Only take over-the-counter or prescription medicines as directed by your caregiver. Over-the-counter medicines to reduce pain and inflammation are often the most helpful.Your caregiver may prescribe muscle relaxant drugs.These medicines help dull your pain so you can more quickly return to your normal activities and healthy exercise.  Put ice on the injured area.  Put ice in a plastic bag.  Place a towel between your skin and the bag.  Leave the ice on for 15 to 20 minutes, 3 to 4 times a day for the first 2 to 3 days. After that, ice and heat may be alternated to reduce pain and spasms.  Ask your caregiver about trying back exercises and gentle massage. This may be of some benefit.  Avoid feeling anxious or stressed.Stress increases muscle tension and can worsen back pain.It is important to recognize when you are anxious or stressed and learn ways to manage it.Exercise is a great option. SEEK MEDICAL CARE IF:  You have pain that is not relieved with rest or medicine.  You have pain that does not improve in 1 week.  You have new symptoms.  You are generally   not feeling well. SEEK IMMEDIATE MEDICAL CARE IF:   You have pain that radiates from your back into your legs.  You develop new bowel or bladder control problems.  You have unusual weakness or numbness in your arms or legs.  You develop nausea or vomiting.  You develop abdominal pain.  You feel faint. Document Released: 02/28/2005 Document Revised: 08/30/2011 Document Reviewed: 07/19/2010 [employer] Patient Information 2013 [employer].  

## 2012-04-01 ENCOUNTER — Encounter: Payer: Self-pay | Admitting: Internal Medicine

## 2012-04-01 DIAGNOSIS — M545 Low back pain: Secondary | ICD-10-CM | POA: Insufficient documentation

## 2012-04-01 NOTE — Assessment & Plan Note (Signed)
He has no warning s/s and is getting better on nsaids No further eval or treatment is needed at this time

## 2012-04-01 NOTE — Assessment & Plan Note (Signed)
He is doing well on crestor 

## 2012-04-01 NOTE — Assessment & Plan Note (Signed)
He is doing well on the 2 meds

## 2012-04-01 NOTE — Assessment & Plan Note (Signed)
His BP is too low and he is having symptoms so I have asked him to stop the diovan-hct for now

## 2012-04-11 ENCOUNTER — Ambulatory Visit: Payer: 59 | Admitting: Internal Medicine

## 2012-05-02 ENCOUNTER — Other Ambulatory Visit: Payer: Self-pay | Admitting: Internal Medicine

## 2012-06-22 ENCOUNTER — Ambulatory Visit (INDEPENDENT_AMBULATORY_CARE_PROVIDER_SITE_OTHER): Payer: 59 | Admitting: Internal Medicine

## 2012-06-22 ENCOUNTER — Encounter: Payer: Self-pay | Admitting: Internal Medicine

## 2012-06-22 ENCOUNTER — Other Ambulatory Visit (INDEPENDENT_AMBULATORY_CARE_PROVIDER_SITE_OTHER): Payer: 59

## 2012-06-22 VITALS — BP 128/84 | HR 70 | Temp 97.8°F | Resp 16 | Ht 70.0 in | Wt 189.8 lb

## 2012-06-22 DIAGNOSIS — IMO0001 Reserved for inherently not codable concepts without codable children: Secondary | ICD-10-CM

## 2012-06-22 DIAGNOSIS — E785 Hyperlipidemia, unspecified: Secondary | ICD-10-CM

## 2012-06-22 DIAGNOSIS — I10 Essential (primary) hypertension: Secondary | ICD-10-CM

## 2012-06-22 LAB — LIPID PANEL
HDL: 57.8 mg/dL (ref >39.00)
Total CHOL/HDL Ratio: 3
VLDL: 13.4 mg/dL (ref 0.0–40.0)

## 2012-06-22 LAB — HEMOGLOBIN A1C: Hgb A1c MFr Bld: 6.5 % (ref 4.6–6.5)

## 2012-06-22 LAB — BASIC METABOLIC PANEL
Calcium: 9.4 mg/dL (ref 8.4–10.5)
Creatinine, Ser: 1 mg/dL (ref 0.4–1.5)
GFR: 79.63 mL/min (ref >60.00)
Sodium: 136 mEq/L (ref 135–145)

## 2012-06-22 NOTE — Progress Notes (Signed)
Subjective:    Patient ID: Clifford Brown, male    DOB: [birth year], 62 y.o.   MRN: [id number]  Diabetes He presents for his follow-up diabetic visit. He has type 2 diabetes mellitus. His disease course has been stable. There are no hypoglycemic associated symptoms. Pertinent negatives for hypoglycemia include no dizziness, pallor or tremors. Pertinent negatives for diabetes include no blurred vision, no chest pain, no fatigue, no foot paresthesias, no foot ulcerations, no polydipsia, no polyphagia, no polyuria, no visual change, no weakness and no weight loss. There are no hypoglycemic complications. There are no diabetic complications. Current diabetic treatment includes oral agent (dual therapy). He is compliant with treatment all of the time. His weight is stable. He is following a generally unhealthy diet. When asked about meal planning, he reported none. He participates in exercise intermittently. His home blood glucose trend is decreasing steadily. His breakfast blood glucose range is generally 110-130 mg/dl. His lunch blood glucose range is generally 130-140 mg/dl. His dinner blood glucose range is generally 130-140 mg/dl. His highest blood glucose is 140-180 mg/dl. His overall blood glucose range is 130-140 mg/dl. An ACE inhibitor/angiotensin II receptor blocker is not being taken. He does not see a podiatrist.Eye exam is current.      Review of Systems  Constitutional: Negative.  Negative for fever, chills, weight loss, diaphoresis, activity change, appetite change, fatigue and unexpected weight change.  HENT: Negative.   Eyes: Negative.  Negative for blurred vision.  Respiratory: Negative.  Negative for cough, chest tightness, shortness of breath, wheezing and stridor.   Cardiovascular: Negative.  Negative for chest pain, palpitations and leg swelling.  Gastrointestinal: Negative.  Negative for nausea, vomiting, abdominal pain, diarrhea, constipation and blood in stool.  Endocrine:  Negative.  Negative for polydipsia, polyphagia and polyuria.  Genitourinary: Negative.   Musculoskeletal: Negative.  Negative for myalgias, back pain, joint swelling, arthralgias and gait problem.  Skin: Negative.  Negative for color change, pallor, rash and wound.  Allergic/Immunologic: Negative.   Neurological: Negative.  Negative for dizziness, tremors, weakness and light-headedness.  Hematological: Negative.  Negative for adenopathy. Does not bruise/bleed easily.  Psychiatric/Behavioral: Negative.        Objective:   Physical Exam  Vitals reviewed. Constitutional: He is oriented to person, place, and time. He appears well-developed and well-nourished. No distress.  HENT:  Head: Normocephalic and atraumatic.  Mouth/Throat: Oropharynx is clear and moist. No oropharyngeal exudate.  Eyes: Conjunctivae are normal. Right eye exhibits no discharge. Left eye exhibits no discharge. No scleral icterus.  Neck: Normal range of motion. Neck supple. No JVD present. No tracheal deviation present. No thyromegaly present.  Cardiovascular: Normal rate, regular rhythm, normal heart sounds and intact distal pulses.  Exam reveals no gallop and no friction rub.   No murmur heard. Pulmonary/Chest: Effort normal and breath sounds normal. No stridor. No respiratory distress. He has no wheezes. He has no rales. He exhibits no tenderness.  Abdominal: Soft. Bowel sounds are normal. He exhibits no distension and no mass. There is no tenderness. There is no rebound and no guarding.  Musculoskeletal: Normal range of motion. He exhibits no edema and no tenderness.  Lymphadenopathy:    He has no cervical adenopathy.  Neurological: He is oriented to person, place, and time.  Skin: Skin is warm and dry. No rash noted. He is not diaphoretic. No erythema. No pallor.  Psychiatric: He has a normal mood and affect. His behavior is normal. Judgment and thought content normal.  Lab Results  Component Value Date    WBC 4.1* 02/02/2012   HGB 15.8 02/02/2012   HCT 46.8 02/02/2012   PLT 176.0 02/02/2012   GLUCOSE 213* 02/03/2012   CHOL 184 02/02/2012   TRIG 130.0 02/02/2012   HDL 39.30 02/02/2012   LDLDIRECT 183.6 01/31/2011   LDLCALC 119* 02/02/2012   ALT 54* 02/02/2012   AST 29 02/02/2012   NA 135 02/03/2012   K 3.7 02/03/2012   CL 100 02/03/2012   CREATININE 1.0 02/03/2012   BUN 17 02/03/2012   CO2 28 02/03/2012   TSH 5.36 02/02/2012   PSA 0.01* 02/02/2012   HGBA1C 9.5* 02/02/2012      Assessment & Plan:

## 2012-06-22 NOTE — Patient Instructions (Signed)

## 2012-06-22 NOTE — Assessment & Plan Note (Signed)
His BP is well controlled 

## 2012-06-22 NOTE — Assessment & Plan Note (Signed)
I will recheck his A1C and will adjust his meds if needed I will also monitor his renal function

## 2012-06-22 NOTE — Assessment & Plan Note (Addendum)
He is doing well on crestor I will recheck his FLP today and will address if needed

## 2012-07-13 ENCOUNTER — Other Ambulatory Visit: Payer: Self-pay | Admitting: Internal Medicine

## 2012-11-07 ENCOUNTER — Other Ambulatory Visit: Payer: Self-pay | Admitting: Internal Medicine

## 2012-12-18 ENCOUNTER — Other Ambulatory Visit: Payer: Self-pay | Admitting: Internal Medicine

## 2013-01-02 ENCOUNTER — Other Ambulatory Visit: Payer: Self-pay | Admitting: Internal Medicine

## 2013-01-08 ENCOUNTER — Encounter: Payer: Self-pay | Admitting: Internal Medicine

## 2013-01-08 ENCOUNTER — Other Ambulatory Visit (INDEPENDENT_AMBULATORY_CARE_PROVIDER_SITE_OTHER): Payer: 59

## 2013-01-08 ENCOUNTER — Ambulatory Visit (INDEPENDENT_AMBULATORY_CARE_PROVIDER_SITE_OTHER): Payer: 59 | Admitting: Internal Medicine

## 2013-01-08 VITALS — BP 126/82 | HR 78 | Temp 97.8°F | Resp 16 | Ht 70.0 in | Wt 187.0 lb

## 2013-01-08 DIAGNOSIS — Z23 Encounter for immunization: Secondary | ICD-10-CM

## 2013-01-08 DIAGNOSIS — I10 Essential (primary) hypertension: Secondary | ICD-10-CM

## 2013-01-08 DIAGNOSIS — IMO0001 Reserved for inherently not codable concepts without codable children: Secondary | ICD-10-CM

## 2013-01-08 DIAGNOSIS — E785 Hyperlipidemia, unspecified: Secondary | ICD-10-CM

## 2013-01-08 LAB — MICROALBUMIN / CREATININE URINE RATIO
Creatinine,U: 120.8 mg/dL
Microalb Creat Ratio: 1.3 mg/g (ref 0.0–30.0)
Microalb, Ur: 1.6 mg/dL (ref 0.0–1.9)

## 2013-01-08 LAB — URINALYSIS, ROUTINE W REFLEX MICROSCOPIC
Bilirubin Urine: NEGATIVE
Ketones, ur: NEGATIVE
Leukocytes, UA: NEGATIVE
Urine Glucose: NEGATIVE
Urobilinogen, UA: 0.2 (ref 0.0–1.0)

## 2013-01-08 LAB — BASIC METABOLIC PANEL
BUN: 21 mg/dL (ref 6–23)
Chloride: 106 mEq/L (ref 96–112)
GFR: 87.43 mL/min (ref >60.00)
Glucose, Bld: 114 mg/dL — ABNORMAL HIGH (ref 70–99)
Potassium: 4.3 mEq/L (ref 3.5–5.1)
Sodium: 139 mEq/L (ref 135–145)

## 2013-01-08 MED ORDER — SITAGLIP PHOS-METFORMIN HCL ER 100-1000 MG PO TB24: 1.0000 | ORAL_TABLET | Freq: Every day | ORAL | Status: DC

## 2013-01-08 MED ORDER — ROSUVASTATIN CALCIUM 10 MG PO TABS: ORAL_TABLET | ORAL | Status: DC

## 2013-01-08 NOTE — Assessment & Plan Note (Signed)
He is doing well on crestor 

## 2013-01-08 NOTE — Progress Notes (Signed)
Subjective:    Patient ID: Clifford Brown, male    DOB: 10-29-1950, 62 y.o.   MRN: [id number]  Diabetes He presents for his follow-up diabetic visit. He has type 2 diabetes mellitus. His disease course has been stable. There are no hypoglycemic associated symptoms. Pertinent negatives for hypoglycemia include no dizziness. Pertinent negatives for diabetes include no blurred vision, no chest pain, no fatigue, no foot paresthesias, no foot ulcerations, no polydipsia, no polyphagia, no polyuria, no visual change, no weakness and no weight loss. There are no hypoglycemic complications. There are no diabetic complications. Current diabetic treatment includes oral agent (dual therapy). He is compliant with treatment all of the time. His weight is stable. He is following a generally healthy diet. Meal planning includes avoidance of concentrated sweets. He has not had a previous visit with a dietician. He never participates in exercise. There is no change in his home blood glucose trend. An ACE inhibitor/angiotensin II receptor blocker is not being taken. He does not see a podiatrist.Eye exam is current.      Review of Systems  Constitutional: Negative.  Negative for fever, chills, weight loss, diaphoresis, appetite change and fatigue.  HENT: Negative.   Eyes: Negative.  Negative for blurred vision.  Respiratory: Negative.  Negative for cough, chest tightness, shortness of breath, wheezing and stridor.   Cardiovascular: Negative.  Negative for chest pain, palpitations and leg swelling.  Gastrointestinal: Negative.  Negative for nausea, vomiting, abdominal pain, diarrhea, constipation and blood in stool.  Endocrine: Negative.  Negative for polydipsia, polyphagia and polyuria.  Genitourinary: Negative.   Musculoskeletal: Negative.  Negative for arthralgias, back pain, gait problem, joint swelling, myalgias, neck pain and neck stiffness.  Skin: Negative.   Allergic/Immunologic: Negative.   Neurological:  Negative.  Negative for dizziness, weakness and light-headedness.  Hematological: Negative.  Negative for adenopathy. Does not bruise/bleed easily.  Psychiatric/Behavioral: Negative.        Objective:   Physical Exam  Vitals reviewed. Constitutional: He is oriented to person, place, and time. He appears well-developed and well-nourished. No distress.  HENT:  Head: Normocephalic and atraumatic.  Mouth/Throat: Oropharynx is clear and moist. No oropharyngeal exudate.  Eyes: Conjunctivae are normal. Right eye exhibits no discharge. Left eye exhibits no discharge. No scleral icterus.  Neck: Normal range of motion. Neck supple. No JVD present. No tracheal deviation present. No thyromegaly present.  Cardiovascular: Normal rate, regular rhythm, normal heart sounds and intact distal pulses.  Exam reveals no gallop and no friction rub.   No murmur heard. Pulmonary/Chest: Effort normal and breath sounds normal. No stridor. No respiratory distress. He has no wheezes. He has no rales. He exhibits no tenderness.  Abdominal: Soft. Bowel sounds are normal. He exhibits no distension and no mass. There is no tenderness. There is no rebound and no guarding.  Musculoskeletal: Normal range of motion. He exhibits no edema and no tenderness.  Lymphadenopathy:    He has no cervical adenopathy.  Neurological: He is oriented to person, place, and time.  Skin: Skin is warm and dry. No rash noted. He is not diaphoretic. No erythema. No pallor.  Psychiatric: He has a normal mood and affect. His behavior is normal. Judgment and thought content normal.     Lab Results  Component Value Date   WBC 4.1* 02/02/2012   HGB 15.8 02/02/2012   HCT 46.8 02/02/2012   PLT 176.0 02/02/2012   GLUCOSE 96 06/22/2012   CHOL 158 06/22/2012   TRIG 67.0 06/22/2012   HDL 57.80  06/22/2012   LDLDIRECT 183.6 01/31/2011   LDLCALC 87 06/22/2012   ALT 54* 02/02/2012   AST 29 02/02/2012   NA 136 06/22/2012   K 4.2 06/22/2012   CL 102  06/22/2012   CREATININE 1.0 06/22/2012   BUN 15 06/22/2012   CO2 26 06/22/2012   TSH 5.36 02/02/2012   PSA 0.01* 02/02/2012   HGBA1C 6.5 06/22/2012       Assessment & Plan:

## 2013-01-08 NOTE — Assessment & Plan Note (Signed)
I will check his A1C and will monitor his renal function 

## 2013-03-15 ENCOUNTER — Other Ambulatory Visit: Payer: Self-pay | Admitting: Internal Medicine

## 2013-04-03 LAB — HM DIABETES EYE EXAM

## 2013-06-27 ENCOUNTER — Ambulatory Visit (INDEPENDENT_AMBULATORY_CARE_PROVIDER_SITE_OTHER): Payer: 59 | Admitting: Internal Medicine

## 2013-06-27 ENCOUNTER — Encounter: Payer: Self-pay | Admitting: Internal Medicine

## 2013-06-27 ENCOUNTER — Other Ambulatory Visit (INDEPENDENT_AMBULATORY_CARE_PROVIDER_SITE_OTHER): Payer: 59

## 2013-06-27 VITALS — BP 150/100 | HR 73 | Temp 98.3°F | Resp 16 | Ht 70.0 in | Wt 192.0 lb

## 2013-06-27 DIAGNOSIS — E1165 Type 2 diabetes mellitus with hyperglycemia: Secondary | ICD-10-CM

## 2013-06-27 DIAGNOSIS — IMO0001 Reserved for inherently not codable concepts without codable children: Secondary | ICD-10-CM

## 2013-06-27 DIAGNOSIS — E785 Hyperlipidemia, unspecified: Secondary | ICD-10-CM

## 2013-06-27 DIAGNOSIS — I10 Essential (primary) hypertension: Secondary | ICD-10-CM

## 2013-06-27 DIAGNOSIS — G4700 Insomnia, unspecified: Secondary | ICD-10-CM

## 2013-06-27 LAB — URINALYSIS, ROUTINE W REFLEX MICROSCOPIC
BILIRUBIN URINE: NEGATIVE
Hgb urine dipstick: NEGATIVE
Ketones, ur: NEGATIVE
Leukocytes, UA: NEGATIVE
Nitrite: NEGATIVE
PH: 7 (ref 5.0–8.0)
RBC / HPF: NONE SEEN (ref 0–?)
Specific Gravity, Urine: 1.02 (ref 1.000–1.030)
TOTAL PROTEIN, URINE-UPE24: NEGATIVE
Urine Glucose: NEGATIVE
Urobilinogen, UA: 0.2 (ref 0.0–1.0)
WBC UA: NONE SEEN (ref 0–?)

## 2013-06-27 LAB — LIPID PANEL
Cholesterol: 164 mg/dL (ref 0–200)
HDL: 65 mg/dL (ref >39.00)
LDL Cholesterol: 86 mg/dL (ref 0–99)
TRIGLYCERIDES: 65 mg/dL (ref 0.0–149.0)
Total CHOL/HDL Ratio: 3
VLDL: 13 mg/dL (ref 0.0–40.0)

## 2013-06-27 LAB — BASIC METABOLIC PANEL
BUN: 14 mg/dL (ref 6–23)
CHLORIDE: 102 meq/L (ref 96–112)
CO2: 32 meq/L (ref 19–32)
CREATININE: 0.9 mg/dL (ref 0.4–1.5)
Calcium: 9.5 mg/dL (ref 8.4–10.5)
GFR: 90.66 mL/min (ref >60.00)
GLUCOSE: 103 mg/dL — AB (ref 70–99)
POTASSIUM: 4.2 meq/L (ref 3.5–5.1)
Sodium: 139 mEq/L (ref 135–145)

## 2013-06-27 LAB — TSH: TSH: 3.89 u[IU]/mL (ref 0.35–5.50)

## 2013-06-27 LAB — HEMOGLOBIN A1C: HEMOGLOBIN A1C: 6.5 % (ref 4.6–6.5)

## 2013-06-27 MED ORDER — DOXEPIN HCL 6 MG PO TABS: 1.0000 | ORAL_TABLET | Freq: Every evening | ORAL | Status: DC | PRN

## 2013-06-27 MED ORDER — OLMESARTAN MEDOXOMIL 40 MG PO TABS: 40.0000 mg | ORAL_TABLET | Freq: Every day | ORAL | Status: DC

## 2013-06-27 NOTE — Assessment & Plan Note (Signed)
He is doing well on crestor FLP today

## 2013-06-27 NOTE — Progress Notes (Signed)
Subjective:    Patient ID: Clifford Brown, male    DOB: 09-14-1950, 63 y.o.   MRN: [id number]  Hypertension This is a recurrent problem. The current episode started more than 1 month ago. The problem has been gradually worsening since onset. The problem is uncontrolled. Pertinent negatives include no anxiety, blurred vision, chest pain, headaches, malaise/fatigue, neck pain, orthopnea, palpitations, peripheral edema, PND, shortness of breath or sweats. Agents associated with hypertension include NSAIDs and decongestants. Past treatments include nothing. The current treatment provides no improvement. Compliance problems include exercise and diet.       Review of Systems  Constitutional: Positive for unexpected weight change (some weight gain). Negative for fever, chills, malaise/fatigue, diaphoresis, appetite change and fatigue.  HENT: Negative.   Eyes: Negative.  Negative for blurred vision.  Respiratory: Negative.  Negative for apnea, cough, choking, chest tightness, shortness of breath, wheezing and stridor.   Cardiovascular: Negative.  Negative for chest pain, palpitations, orthopnea and PND.  Gastrointestinal: Negative.  Negative for nausea, vomiting, abdominal pain, diarrhea, constipation and blood in stool.  Endocrine: Negative.   Genitourinary: Negative.  Negative for dysuria, urgency, frequency, hematuria, flank pain, decreased urine volume and difficulty urinating.  Musculoskeletal: Negative.  Negative for arthralgias, back pain, gait problem, joint swelling, myalgias, neck pain and neck stiffness.  Skin: Negative.   Allergic/Immunologic: Negative.   Neurological: Negative.  Negative for dizziness, tremors, syncope, weakness, light-headedness, numbness and headaches.  Hematological: Negative.  Negative for adenopathy. Does not bruise/bleed easily.  Psychiatric/Behavioral: Positive for sleep disturbance (FA and EMA). Negative for suicidal ideas, hallucinations, behavioral problems,  confusion, self-injury, dysphoric mood, decreased concentration and agitation. The patient is not nervous/anxious and is not hyperactive.        Objective:   Physical Exam  Vitals reviewed. Constitutional: He is oriented to person, place, and time. He appears well-developed and well-nourished. No distress.  HENT:  Head: Normocephalic and atraumatic.  Mouth/Throat: Oropharynx is clear and moist. No oropharyngeal exudate.  Eyes: Conjunctivae are normal. Right eye exhibits no discharge. Left eye exhibits no discharge. No scleral icterus.  Neck: Normal range of motion. Neck supple. No JVD present. No tracheal deviation present. No thyromegaly present.  Cardiovascular: Normal rate, regular rhythm, normal heart sounds and intact distal pulses.  Exam reveals no gallop and no friction rub.   No murmur heard. Pulmonary/Chest: Effort normal and breath sounds normal. No stridor. No respiratory distress. He has no wheezes. He has no rales. He exhibits no tenderness.  Abdominal: Soft. Bowel sounds are normal. He exhibits no distension and no mass. There is no tenderness. There is no rebound and no guarding.  Musculoskeletal: Normal range of motion. He exhibits no edema and no tenderness.  Lymphadenopathy:    He has no cervical adenopathy.  Neurological: He is oriented to person, place, and time.  Skin: Skin is warm and dry. No rash noted. He is not diaphoretic. No erythema. No pallor.  Psychiatric: He has a normal mood and affect. His behavior is normal. Judgment and thought content normal.     Lab Results  Component Value Date   WBC 4.1* 02/02/2012   HGB 15.8 02/02/2012   HCT 46.8 02/02/2012   PLT 176.0 02/02/2012   GLUCOSE 114* 01/08/2013   CHOL 158 06/22/2012   TRIG 67.0 06/22/2012   HDL 57.80 06/22/2012   LDLDIRECT 183.6 01/31/2011   LDLCALC 87 06/22/2012   ALT 54* 02/02/2012   AST 29 02/02/2012   NA 139 01/08/2013   K 4.3 01/08/2013  CL 106 01/08/2013   CREATININE 0.9 01/08/2013    BUN 21 01/08/2013   CO2 24 01/08/2013   TSH 5.36 02/02/2012   PSA 0.01* 02/02/2012   HGBA1C 6.6* 01/08/2013   MICROALBUR 1.6 01/08/2013       Assessment & Plan:

## 2013-06-27 NOTE — Progress Notes (Signed)
Pre visit review using our clinic review tool, if applicable. No additional management support is needed unless otherwise documented below in the visit note. 

## 2013-06-27 NOTE — Patient Instructions (Signed)

## 2013-06-27 NOTE — Assessment & Plan Note (Signed)
He will try silenor for this 

## 2013-06-27 NOTE — Assessment & Plan Note (Signed)
I will check his A1C and will monitor his renal function 

## 2013-06-27 NOTE — Assessment & Plan Note (Signed)
His BP is not well controlled He will stop the nsaids and decongestants I will check his labs today to look for end organ damage and secondary causes of HTN I have asked him to start treating with benicar

## 2013-06-28 ENCOUNTER — Encounter: Payer: Self-pay | Admitting: Internal Medicine

## 2013-09-26 ENCOUNTER — Telehealth: Payer: Self-pay

## 2013-09-26 NOTE — Telephone Encounter (Signed)
LVM for pt to call and schedule CPE with PCP

## 2013-10-23 ENCOUNTER — Encounter: Payer: Self-pay | Admitting: Internal Medicine

## 2013-10-23 ENCOUNTER — Other Ambulatory Visit (INDEPENDENT_AMBULATORY_CARE_PROVIDER_SITE_OTHER): Payer: 59

## 2013-10-23 ENCOUNTER — Ambulatory Visit (INDEPENDENT_AMBULATORY_CARE_PROVIDER_SITE_OTHER): Payer: 59 | Admitting: Internal Medicine

## 2013-10-23 VITALS — BP 130/80 | HR 49 | Temp 98.2°F | Resp 16 | Ht 70.0 in | Wt 191.2 lb

## 2013-10-23 DIAGNOSIS — IMO0001 Reserved for inherently not codable concepts without codable children: Secondary | ICD-10-CM

## 2013-10-23 DIAGNOSIS — M533 Sacrococcygeal disorders, not elsewhere classified: Secondary | ICD-10-CM

## 2013-10-23 DIAGNOSIS — Z0000 Encounter for general adult medical examination without abnormal findings: Secondary | ICD-10-CM

## 2013-10-23 DIAGNOSIS — E785 Hyperlipidemia, unspecified: Secondary | ICD-10-CM

## 2013-10-23 DIAGNOSIS — F3289 Other specified depressive episodes: Secondary | ICD-10-CM

## 2013-10-23 DIAGNOSIS — M545 Low back pain, unspecified: Secondary | ICD-10-CM

## 2013-10-23 DIAGNOSIS — Z8546 Personal history of malignant neoplasm of prostate: Secondary | ICD-10-CM

## 2013-10-23 DIAGNOSIS — E1165 Type 2 diabetes mellitus with hyperglycemia: Secondary | ICD-10-CM

## 2013-10-23 DIAGNOSIS — F329 Major depressive disorder, single episode, unspecified: Secondary | ICD-10-CM

## 2013-10-23 DIAGNOSIS — I10 Essential (primary) hypertension: Secondary | ICD-10-CM

## 2013-10-23 LAB — LIPID PANEL
CHOLESTEROL: 154 mg/dL (ref 0–200)
HDL: 60.3 mg/dL (ref >39.00)
LDL CALC: 72 mg/dL (ref 0–99)
NonHDL: 93.7
Total CHOL/HDL Ratio: 3
Triglycerides: 110 mg/dL (ref 0.0–149.0)
VLDL: 22 mg/dL (ref 0.0–40.0)

## 2013-10-23 LAB — CBC WITH DIFFERENTIAL/PLATELET
BASOS PCT: 0.9 % (ref 0.0–3.0)
Basophils Absolute: 0 10*3/uL (ref 0.0–0.1)
EOS PCT: 1.7 % (ref 0.0–5.0)
Eosinophils Absolute: 0.1 10*3/uL (ref 0.0–0.7)
HCT: 44.7 % (ref 39.0–52.0)
Hemoglobin: 15.4 g/dL (ref 13.0–17.0)
LYMPHS PCT: 22.4 % (ref 12.0–46.0)
Lymphs Abs: 1.3 10*3/uL (ref 0.7–4.0)
MCHC: 34.4 g/dL (ref 30.0–36.0)
MCV: 89.2 fl (ref 78.0–100.0)
MONOS PCT: 7 % (ref 3.0–12.0)
Monocytes Absolute: 0.4 10*3/uL (ref 0.1–1.0)
NEUTROS PCT: 68 % (ref 43.0–77.0)
Neutro Abs: 3.8 10*3/uL (ref 1.4–7.7)
Platelets: 197 10*3/uL (ref 150.0–400.0)
RBC: 5.01 Mil/uL (ref 4.22–5.81)
RDW: 13 % (ref 11.5–15.5)
WBC: 5.6 10*3/uL (ref 4.0–10.5)

## 2013-10-23 LAB — COMPREHENSIVE METABOLIC PANEL
ALBUMIN: 4.1 g/dL (ref 3.5–5.2)
ALT: 28 U/L (ref 0–53)
AST: 22 U/L (ref 0–37)
Alkaline Phosphatase: 72 U/L (ref 39–117)
BUN: 15 mg/dL (ref 6–23)
CALCIUM: 9.9 mg/dL (ref 8.4–10.5)
CHLORIDE: 102 meq/L (ref 96–112)
CO2: 27 mEq/L (ref 19–32)
Creatinine, Ser: 1.2 mg/dL (ref 0.4–1.5)
GFR: 64.36 mL/min (ref >60.00)
Glucose, Bld: 128 mg/dL — ABNORMAL HIGH (ref 70–99)
POTASSIUM: 4.1 meq/L (ref 3.5–5.1)
Sodium: 137 mEq/L (ref 135–145)
Total Bilirubin: 1.1 mg/dL (ref 0.2–1.2)
Total Protein: 6.9 g/dL (ref 6.0–8.3)

## 2013-10-23 LAB — PSA: PSA: 0.01 ng/mL — ABNORMAL LOW (ref 0.10–4.00)

## 2013-10-23 LAB — TSH: TSH: 3.39 u[IU]/mL (ref 0.35–4.50)

## 2013-10-23 LAB — HEMOGLOBIN A1C: Hgb A1c MFr Bld: 6.9 % — ABNORMAL HIGH (ref 4.6–6.5)

## 2013-10-23 NOTE — Patient Instructions (Signed)
Health Maintenance A healthy lifestyle and preventative care can promote health and wellness.  Maintain regular health, dental, and eye exams.  Eat a healthy diet. Foods like vegetables, fruits, whole grains, low-fat dairy products, and lean protein foods contain the nutrients you need and are low in calories. Decrease your intake of foods high in solid fats, added sugars, and salt. Get information about a proper diet from your health care provider, if necessary.  Regular physical exercise is one of the most important things you can do for your health. Most adults should get at least 150 minutes of moderate-intensity exercise (any activity that increases your heart rate and causes you to sweat) each week. In addition, most adults need muscle-strengthening exercises on 2 or more days a week.   Maintain a healthy weight. The body mass index (BMI) is a screening tool to identify possible weight problems. It provides an estimate of body fat based on height and weight. Your health care provider can find your BMI and can help you achieve or maintain a healthy weight. For males 20 years and older:  A BMI below 18.5 is considered underweight.  A BMI of 18.5 to 24.9 is normal.  A BMI of 25 to 29.9 is considered overweight.  A BMI of 30 and above is considered obese.  Maintain normal blood lipids and cholesterol by exercising and minimizing your intake of saturated fat. Eat a balanced diet with plenty of fruits and vegetables. Blood tests for lipids and cholesterol should begin at age 20 and be repeated every 5 years. If your lipid or cholesterol levels are high, you are over age 50, or you are at high risk for heart disease, you may need your cholesterol levels checked more frequently.Ongoing high lipid and cholesterol levels should be treated with medicines if diet and exercise are not working.  If you smoke, find out from your health care provider how to quit. If you do not use tobacco, do not  start.  Lung cancer screening is recommended for adults aged 55-80 years who are at high risk for developing lung cancer because of a history of smoking. A yearly low-dose CT scan of the lungs is recommended for people who have at least a 30-pack-year history of smoking and are current smokers or have quit within the past 15 years. A pack year of smoking is smoking an average of 1 pack of cigarettes a day for 1 year (for example, a 30-pack-year history of smoking could mean smoking 1 pack a day for 30 years or 2 packs a day for 15 years). Yearly screening should continue until the smoker has stopped smoking for at least 15 years. Yearly screening should be stopped for people who develop a health problem that would prevent them from having lung cancer treatment.  If you choose to drink alcohol, do not have more than 2 drinks per day. One drink is considered to be 12 oz (360 mL) of beer, 5 oz (150 mL) of wine, or 1.5 oz (45 mL) of liquor.  Avoid the use of street drugs. Do not share needles with anyone. Ask for help if you need support or instructions about stopping the use of drugs.  High blood pressure causes heart disease and increases the risk of stroke. Blood pressure should be checked at least every 1-2 years. Ongoing high blood pressure should be treated with medicines if weight loss and exercise are not effective.  If you are 45-79 years old, ask your health care provider if   you should take aspirin to prevent heart disease.  Diabetes screening involves taking a blood sample to check your fasting blood sugar level. This should be done once every 3 years after age 45 if you are at a normal weight and without risk factors for diabetes. Testing should be considered at a younger age or be carried out more frequently if you are overweight and have at least 1 risk factor for diabetes.  Colorectal cancer can be detected and often prevented. Most routine colorectal cancer screening begins at the age of 50  and continues through age 75. However, your health care provider may recommend screening at an earlier age if you have risk factors for colon cancer. On a yearly basis, your health care provider may provide home test kits to check for hidden blood in the stool. A small camera at the end of a tube may be used to directly examine the colon (sigmoidoscopy or colonoscopy) to detect the earliest forms of colorectal cancer. Talk to your health care provider about this at age 50 when routine screening begins. A direct exam of the colon should be repeated every 5-10 years through age 75, unless early forms of precancerous polyps or small growths are found.  People who are at an increased risk for hepatitis B should be screened for this virus. You are considered at high risk for hepatitis B if:  You were born in a country where hepatitis B occurs often. Talk with your health care provider about which countries are considered high risk.  Your parents were born in a high-risk country and you have not received a shot to protect against hepatitis B (hepatitis B vaccine).  You have HIV or AIDS.  You use needles to inject street drugs.  You live with, or have sex with, someone who has hepatitis B.  You are a man who has sex with other men (MSM).  You get hemodialysis treatment.  You take certain medicines for conditions like cancer, organ transplantation, and autoimmune conditions.  Hepatitis C blood testing is recommended for all people born from 1945 through 1965 and any individual with known risk factors for hepatitis C.  Healthy men should no longer receive prostate-specific antigen (PSA) blood tests as part of routine cancer screening. Talk to your health care provider about prostate cancer screening.  Testicular cancer screening is not recommended for adolescents or adult males who have no symptoms. Screening includes self-exam, a health care provider exam, and other screening tests. Consult with your  health care provider about any symptoms you have or any concerns you have about testicular cancer.  Practice safe sex. Use condoms and avoid high-risk sexual practices to reduce the spread of sexually transmitted infections (STIs).  You should be screened for STIs, including gonorrhea and chlamydia if:  You are sexually active and are younger than 24 years.  You are older than 24 years, and your health care provider tells you that you are at risk for this type of infection.  Your sexual activity has changed since you were last screened, and you are at an increased risk for chlamydia or gonorrhea. Ask your health care provider if you are at risk.  If you are at risk of being infected with HIV, it is recommended that you take a prescription medicine daily to prevent HIV infection. This is called pre-exposure prophylaxis (PrEP). You are considered at risk if:  You are a man who has sex with other men (MSM).  You are a heterosexual man who   is sexually active with multiple partners.  You take drugs by injection.  You are sexually active with a partner who has HIV.  Talk with your health care provider about whether you are at high risk of being infected with HIV. If you choose to begin PrEP, you should first be tested for HIV. You should then be tested every 3 months for as long as you are taking PrEP.  Use sunscreen. Apply sunscreen liberally and repeatedly throughout the day. You should seek shade when your shadow is shorter than you. Protect yourself by wearing long sleeves, pants, a wide-brimmed hat, and sunglasses year round whenever you are outdoors.  Tell your health care provider of new moles or changes in moles, especially if there is a change in shape or color. Also, tell your health care provider if a mole is larger than the size of a pencil eraser.  A one-time screening for abdominal aortic aneurysm (AAA) and surgical repair of large AAAs by ultrasound is recommended for men aged  65-75 years who are current or former smokers.  Stay current with your vaccines (immunizations). Document Released: 08/27/2007 Document Revised: 03/05/2013 Document Reviewed: 07/26/2010 [employer] Patient Information 2015 [employer]. This information is not intended to replace advice given to you by your health care provider. Make sure you discuss any questions you have with your health care provider. Type 2 Diabetes Mellitus Type 2 diabetes mellitus, often simply referred to as type 2 diabetes, is a long-lasting (chronic) disease. In type 2 diabetes, the pancreas does not make enough insulin (a hormone), the cells are less responsive to the insulin that is made (insulin resistance), or both. Normally, insulin moves sugars from food into the tissue cells. The tissue cells use the sugars for energy. The lack of insulin or the lack of normal response to insulin causes excess sugars to build up in the blood instead of going into the tissue cells. As a result, high blood sugar (hyperglycemia) develops. The effect of high sugar (glucose) levels can cause many complications. Type 2 diabetes was also previously called adult-onset diabetes, but it can occur at any age.  RISK FACTORS  A person is predisposed to developing type 2 diabetes if someone in the family has the disease and also has one or more of the following primary risk factors:  Overweight.  An inactive lifestyle.  A history of consistently eating high-calorie foods. Maintaining a normal weight and regular physical activity can reduce the chance of developing type 2 diabetes. SYMPTOMS  A person with type 2 diabetes may not show symptoms initially. The symptoms of type 2 diabetes appear slowly. The symptoms include:  Increased thirst (polydipsia).  Increased urination (polyuria).  Increased urination during the night (nocturia).  Weight loss. This weight loss may be rapid.  Frequent, recurring infections.  Tiredness  (fatigue).  Weakness.  Vision changes, such as blurred vision.  Fruity smell to your breath.  Abdominal pain.  Nausea or vomiting.  Cuts or bruises which are slow to heal.  Tingling or numbness in the hands or feet. DIAGNOSIS Type 2 diabetes is frequently not diagnosed until complications of diabetes are present. Type 2 diabetes is diagnosed when symptoms or complications are present and when blood glucose levels are increased. Your blood glucose level may be checked by one or more of the following blood tests:  A fasting blood glucose test. You will not be allowed to eat for at least 8 hours before a blood sample is taken.  A random blood   glucose test. Your blood glucose is checked at any time of the day regardless of when you ate.  A hemoglobin A1c blood glucose test. A hemoglobin A1c test provides information about blood glucose control over the previous 3 months.  An oral glucose tolerance test (OGTT). Your blood glucose is measured after you have not eaten (fasted) for 2 hours and then after you drink a glucose-containing beverage. TREATMENT   You may need to take insulin or diabetes medicine daily to keep blood glucose levels in the desired range.  If you use insulin, you may need to adjust the dosage depending on the carbohydrates that you eat with each meal or snack. The treatment goal is to maintain the before meal blood sugar (preprandial glucose) level at 70-130 mg/dL. HOME CARE INSTRUCTIONS   Have your hemoglobin A1c level checked twice a year.  Perform daily blood glucose monitoring as directed by your health care provider.  Monitor urine ketones when you are ill and as directed by your health care provider.  Take your diabetes medicine or insulin as directed by your health care provider to maintain your blood glucose levels in the desired range.  Never run out of diabetes medicine or insulin. It is needed every day.  If you are using insulin, you may need to  adjust the amount of insulin given based on your intake of carbohydrates. Carbohydrates can raise blood glucose levels but need to be included in your diet. Carbohydrates provide vitamins, minerals, and fiber which are an essential part of a healthy diet. Carbohydrates are found in fruits, vegetables, whole grains, dairy products, legumes, and foods containing added sugars.  Eat healthy foods. You should make an appointment to see a registered dietitian to help you create an eating plan that is right for you.  Lose weight if you are overweight.  Carry a medical alert card or wear your medical alert jewelry.  Carry a 15-gram carbohydrate snack with you at all times to treat low blood glucose (hypoglycemia). Some examples of 15-gram carbohydrate snacks include:  Glucose tablets, 3 or 4.  Glucose gel, 15-gram tube.  Raisins, 2 tablespoons (24 grams).  Jelly beans, 6.  Animal crackers, 8.  Regular pop, 4 ounces (120 mL).  Gummy treats, 9.  Recognize hypoglycemia. Hypoglycemia occurs with blood glucose levels of 70 mg/dL and below. The risk for hypoglycemia increases when fasting or skipping meals, during or after intense exercise, and during sleep. Hypoglycemia symptoms can include:  Tremors or shakes.  Decreased ability to concentrate.  Sweating.  Increased heart rate.  Headache.  Dry mouth.  Hunger.  Irritability.  Anxiety.  Restless sleep.  Altered speech or coordination.  Confusion.  Treat hypoglycemia promptly. If you are alert and able to safely swallow, follow the 15:15 rule:  Take 15-20 grams of rapid-acting glucose or carbohydrate. Rapid-acting options include glucose gel, glucose tablets, or 4 ounces (120 mL) of fruit juice, regular soda, or low-fat milk.  Check your blood glucose level 15 minutes after taking the glucose.  Take 15-20 grams more of glucose if the repeat blood glucose level is still 70 mg/dL or below.  Eat a meal or snack within 1 hour  once blood glucose levels return to normal.  Be alert to feeling very thirsty and urinating more frequently than usual, which are early signs of hyperglycemia. An early awareness of hyperglycemia allows for prompt treatment. Treat hyperglycemia as directed by your health care provider.  Engage in at least 150 minutes of moderate-intensity physical activity   a week, spread over at least 3 days of the week or as directed by your health care provider. In addition, you should engage in resistance exercise at least 2 times a week or as directed by your health care provider. Try to spend no more than 90 minutes at one time inactive.  Adjust your medicine and food intake as needed if you start a new exercise or sport.  Follow your sick-day plan anytime you are unable to eat or drink as usual.  Do not use any tobacco products including cigarettes, chewing tobacco, or electronic cigarettes. If you need help quitting, ask your health care provider.  Limit alcohol intake to no more than 1 drink per day for nonpregnant women and 2 drinks per day for men. You should drink alcohol only when you are also eating food. Talk with your health care provider whether alcohol is safe for you. Tell your health care provider if you drink alcohol several times a week.  Keep all follow-up visits as directed by your health care provider. This is important.  Schedule an eye exam soon after the diagnosis of type 2 diabetes and then annually.  Perform daily skin and foot care. Examine your skin and feet daily for cuts, bruises, redness, nail problems, bleeding, blisters, or sores. A foot exam by a health care provider should be done annually.  Brush your teeth and gums at least twice a day and floss at least once a day. Follow up with your dentist regularly.  Share your diabetes management plan with your workplace or school.  Stay up-to-date with immunizations. It is recommended that people with diabetes who are over 65  years old get the pneumonia vaccine. In some cases, two separate shots may be given. Ask your health care provider if your pneumonia vaccination is up-to-date.  Learn to manage stress.  Obtain ongoing diabetes education and support as needed.  Participate in or seek rehabilitation as needed to maintain or improve independence and quality of life. Request a physical or occupational therapy referral if you are having foot or hand numbness, or difficulties with grooming, dressing, eating, or physical activity. SEEK MEDICAL CARE IF:   You are unable to eat food or drink fluids for more than 6 hours.  You have nausea and vomiting for more than 6 hours.  Your blood glucose level is over 240 mg/dL.  There is a change in mental status.  You develop an additional serious illness.  You have diarrhea for more than 6 hours.  You have been sick or have had a fever for a couple of days and are not getting better.  You have pain during any physical activity.  SEEK IMMEDIATE MEDICAL CARE IF:  You have difficulty breathing.  You have moderate to large ketone levels. MAKE SURE YOU:  Understand these instructions.  Will watch your condition.  Will get help right away if you are not doing well or get worse. Document Released: 02/28/2005 Document Revised: 07/15/2013 Document Reviewed: 09/27/2011 [employer] Patient Information 2015 [employer]. This information is not intended to replace advice given to you by your health care provider. Make sure you discuss any questions you have with your health care provider.  

## 2013-10-23 NOTE — Progress Notes (Signed)
Pre visit review using our clinic review tool, if applicable. No additional management support is needed unless otherwise documented below in the visit note. 

## 2013-10-23 NOTE — Progress Notes (Signed)
Subjective:    Patient ID: Clifford Brown, male    DOB: [birth year], 63 y.o.   MRN: [id number]  Diabetes He presents for his follow-up diabetic visit. He has type 2 diabetes mellitus. His disease course has been stable. There are no hypoglycemic associated symptoms. There are no diabetic associated symptoms. Pertinent negatives for diabetes include no blurred vision, no chest pain, no fatigue, no foot paresthesias, no foot ulcerations, no polydipsia, no polyphagia, no polyuria, no visual change, no weakness and no weight loss. There are no hypoglycemic complications. There are no diabetic complications. Current diabetic treatment includes oral agent (dual therapy). He is compliant with treatment all of the time. He is following a generally healthy diet. Meal planning includes avoidance of concentrated sweets. He has not had a previous visit with a dietician. He participates in exercise intermittently. There is no change in his home blood glucose trend. An ACE inhibitor/angiotensin II receptor blocker is being taken. He does not see a podiatrist.Eye exam is current.      Review of Systems  Constitutional: Negative.  Negative for fever, chills, weight loss, diaphoresis, appetite change and fatigue.  HENT: Negative.   Eyes: Negative.  Negative for blurred vision.  Respiratory: Negative.  Negative for cough, choking, chest tightness, shortness of breath and stridor.   Cardiovascular: Negative.  Negative for chest pain, palpitations and leg swelling.  Gastrointestinal: Negative.  Negative for vomiting, abdominal pain, diarrhea, constipation and blood in stool.  Endocrine: Negative.  Negative for polydipsia, polyphagia and polyuria.  Genitourinary: Negative.   Musculoskeletal: Positive for back pain. Negative for arthralgias, gait problem, joint swelling, myalgias, neck pain and neck stiffness.  Skin: Negative.  Negative for rash.  Allergic/Immunologic: Negative.   Neurological: Negative.  Negative  for weakness.  Hematological: Negative.  Negative for adenopathy. Does not bruise/bleed easily.  Psychiatric/Behavioral: Negative.        Objective:   Physical Exam  Vitals reviewed. Constitutional: He is oriented to person, place, and time. He appears well-developed and well-nourished. No distress.  HENT:  Head: Normocephalic and atraumatic.  Mouth/Throat: Oropharynx is clear and moist. No oropharyngeal exudate.  Eyes: Conjunctivae are normal. Right eye exhibits no discharge. Left eye exhibits no discharge. No scleral icterus.  Neck: Normal range of motion. Neck supple. No JVD present. No tracheal deviation present. No thyromegaly present.  Cardiovascular: Normal rate, regular rhythm, normal heart sounds and intact distal pulses.  Exam reveals no gallop and no friction rub.   No murmur heard. Pulmonary/Chest: Effort normal and breath sounds normal. No stridor. No respiratory distress. He has no wheezes. He has no rales. He exhibits no tenderness.  Abdominal: Soft. Bowel sounds are normal. He exhibits no distension and no mass. There is no tenderness. There is no rebound and no guarding. Hernia confirmed negative in the right inguinal area and confirmed negative in the left inguinal area.  Genitourinary: Rectum normal, prostate normal, testes normal and penis normal. Rectal exam shows no external hemorrhoid, no internal hemorrhoid, no fissure, no mass, no tenderness and anal tone normal. Guaiac negative stool. Prostate is not enlarged and not tender. Right testis shows no mass, no swelling and no tenderness. Right testis is descended. Left testis shows no swelling and no tenderness. Left testis is descended. Uncircumcised. No phimosis, paraphimosis, hypospadias, penile erythema or penile tenderness. No discharge found.  Musculoskeletal: Normal range of motion. He exhibits no edema and no tenderness.  Lymphadenopathy:    He has no cervical adenopathy.  Right: No inguinal adenopathy  present.       Left: No inguinal adenopathy present.  Neurological: He is alert and oriented to person, place, and time. He has normal reflexes. He displays normal reflexes. No cranial nerve deficit. He exhibits normal muscle tone. Coordination normal.  Skin: Skin is warm and dry. No rash noted. He is not diaphoretic. No erythema. No pallor.  Psychiatric: He has a normal mood and affect. His behavior is normal. Judgment and thought content normal.     Lab Results  Component Value Date   WBC 4.1* 02/02/2012   HGB 15.8 02/02/2012   HCT 46.8 02/02/2012   PLT 176.0 02/02/2012   GLUCOSE 103* 06/27/2013   CHOL 164 06/27/2013   TRIG 65.0 06/27/2013   HDL 65.00 06/27/2013   LDLDIRECT 183.6 01/31/2011   LDLCALC 86 06/27/2013   ALT 54* 02/02/2012   AST 29 02/02/2012   NA 139 06/27/2013   K 4.2 06/27/2013   CL 102 06/27/2013   CREATININE 0.9 06/27/2013   BUN 14 06/27/2013   CO2 32 06/27/2013   TSH 3.89 06/27/2013   PSA 0.01* 02/02/2012   HGBA1C 6.5 06/27/2013   MICROALBUR 1.6 01/08/2013       Assessment & Plan:

## 2013-10-25 NOTE — Assessment & Plan Note (Signed)
His blood sugars are well controlled His renal function is stable 

## 2013-10-25 NOTE — Assessment & Plan Note (Signed)
His BP is well controlled Lytes and renal function are stable 

## 2013-10-25 NOTE — Assessment & Plan Note (Signed)
He has achieved his LDL goal 

## 2013-10-25 NOTE — Assessment & Plan Note (Signed)
There are no concerning s/s related to this

## 2013-10-25 NOTE — Assessment & Plan Note (Signed)
Exam done Vaccines were reviewed Labs ordered Pt ed material was given 

## 2014-02-03 ENCOUNTER — Other Ambulatory Visit: Payer: Self-pay | Admitting: Internal Medicine

## 2014-06-20 ENCOUNTER — Other Ambulatory Visit: Payer: Self-pay | Admitting: Internal Medicine

## 2014-07-15 ENCOUNTER — Other Ambulatory Visit: Payer: Self-pay | Admitting: Internal Medicine

## 2014-08-01 ENCOUNTER — Other Ambulatory Visit: Payer: Self-pay

## 2014-08-01 MED ORDER — SITAGLIP PHOS-METFORMIN HCL ER 100-1000 MG PO TB24: 1.0000 | ORAL_TABLET | Freq: Every day | ORAL | Status: DC

## 2014-08-01 MED ORDER — ROSUVASTATIN CALCIUM 10 MG PO TABS: 10.0000 mg | ORAL_TABLET | Freq: Every day | ORAL | Status: DC

## 2014-08-01 NOTE — Addendum Note (Signed)
Addended by: Earnstine Regal on: 08/01/2014 12:22 PM   Modules accepted: Orders

## 2014-08-21 ENCOUNTER — Ambulatory Visit (INDEPENDENT_AMBULATORY_CARE_PROVIDER_SITE_OTHER): Payer: 59 | Admitting: Internal Medicine

## 2014-08-21 ENCOUNTER — Encounter: Payer: Self-pay | Admitting: Internal Medicine

## 2014-08-21 ENCOUNTER — Other Ambulatory Visit (INDEPENDENT_AMBULATORY_CARE_PROVIDER_SITE_OTHER): Payer: 59

## 2014-08-21 VITALS — BP 122/80 | HR 85 | Temp 97.6°F | Resp 16 | Ht 70.0 in | Wt 190.0 lb

## 2014-08-21 DIAGNOSIS — Z23 Encounter for immunization: Secondary | ICD-10-CM

## 2014-08-21 DIAGNOSIS — Z0000 Encounter for general adult medical examination without abnormal findings: Secondary | ICD-10-CM

## 2014-08-21 DIAGNOSIS — E118 Type 2 diabetes mellitus with unspecified complications: Secondary | ICD-10-CM | POA: Diagnosis not present

## 2014-08-21 LAB — LIPID PANEL
CHOLESTEROL: 162 mg/dL (ref 0–200)
HDL: 53.3 mg/dL (ref >39.00)
LDL CALC: 89 mg/dL (ref 0–99)
NonHDL: 108.7
Total CHOL/HDL Ratio: 3
Triglycerides: 98 mg/dL (ref 0.0–149.0)
VLDL: 19.6 mg/dL (ref 0.0–40.0)

## 2014-08-21 LAB — MICROALBUMIN / CREATININE URINE RATIO
CREATININE, U: 210.4 mg/dL
Microalb Creat Ratio: 1.4 mg/g (ref 0.0–30.0)
Microalb, Ur: 2.9 mg/dL — ABNORMAL HIGH (ref 0.0–1.9)

## 2014-08-21 LAB — COMPREHENSIVE METABOLIC PANEL
ALBUMIN: 4 g/dL (ref 3.5–5.2)
ALT: 24 U/L (ref 0–53)
AST: 16 U/L (ref 0–37)
Alkaline Phosphatase: 75 U/L (ref 39–117)
BILIRUBIN TOTAL: 0.6 mg/dL (ref 0.2–1.2)
BUN: 16 mg/dL (ref 6–23)
CHLORIDE: 105 meq/L (ref 96–112)
CO2: 26 mEq/L (ref 19–32)
Calcium: 9.3 mg/dL (ref 8.4–10.5)
Creatinine, Ser: 0.99 mg/dL (ref 0.40–1.50)
GFR: 80.92 mL/min (ref >60.00)
GLUCOSE: 159 mg/dL — AB (ref 70–99)
Potassium: 4.2 mEq/L (ref 3.5–5.1)
Sodium: 137 mEq/L (ref 135–145)
Total Protein: 6.4 g/dL (ref 6.0–8.3)

## 2014-08-21 LAB — CBC WITH DIFFERENTIAL/PLATELET
BASOS PCT: 0.7 % (ref 0.0–3.0)
Basophils Absolute: 0 10*3/uL (ref 0.0–0.1)
EOS ABS: 0.2 10*3/uL (ref 0.0–0.7)
Eosinophils Relative: 3.5 % (ref 0.0–5.0)
HCT: 44.3 % (ref 39.0–52.0)
Hemoglobin: 15 g/dL (ref 13.0–17.0)
Lymphocytes Relative: 28 % (ref 12.0–46.0)
Lymphs Abs: 1.2 10*3/uL (ref 0.7–4.0)
MCHC: 34 g/dL (ref 30.0–36.0)
MCV: 88.1 fl (ref 78.0–100.0)
MONOS PCT: 7.4 % (ref 3.0–12.0)
Monocytes Absolute: 0.3 10*3/uL (ref 0.1–1.0)
Neutro Abs: 2.7 10*3/uL (ref 1.4–7.7)
Neutrophils Relative %: 60.4 % (ref 43.0–77.0)
PLATELETS: 177 10*3/uL (ref 150.0–400.0)
RBC: 5.02 Mil/uL (ref 4.22–5.81)
RDW: 12.9 % (ref 11.5–15.5)
WBC: 4.4 10*3/uL (ref 4.0–10.5)

## 2014-08-21 LAB — URINALYSIS, ROUTINE W REFLEX MICROSCOPIC
BILIRUBIN URINE: NEGATIVE
HGB URINE DIPSTICK: NEGATIVE
Ketones, ur: NEGATIVE
Leukocytes, UA: NEGATIVE
Nitrite: NEGATIVE
RBC / HPF: NONE SEEN (ref 0–?)
URINE GLUCOSE: 250 — AB
UROBILINOGEN UA: 0.2 (ref 0.0–1.0)
pH: 5.5 (ref 5.0–8.0)

## 2014-08-21 LAB — HEMOGLOBIN A1C: Hgb A1c MFr Bld: 7.8 % — ABNORMAL HIGH (ref 4.6–6.5)

## 2014-08-21 LAB — TSH: TSH: 5.25 u[IU]/mL — ABNORMAL HIGH (ref 0.35–4.50)

## 2014-08-21 LAB — FECAL OCCULT BLOOD, GUAIAC: Fecal Occult Blood: NEGATIVE

## 2014-08-21 LAB — PSA: PSA: 0 ng/mL — AB (ref 0.10–4.00)

## 2014-08-21 MED ORDER — INSULIN GLARGINE 300 UNIT/ML ~~LOC~~ SOPN: 20.0000 [IU] | PEN_INJECTOR | Freq: Every day | SUBCUTANEOUS | Status: DC

## 2014-08-21 NOTE — Progress Notes (Signed)
Subjective:  Patient ID: Clifford Brown, male    DOB: 1950-07-30  Age: 64 y.o. MRN: [id number]  CC: Annual Exam and Diabetes   HPI Jesiel Garate presents for a CPX and management of HTN and DM2, he feels well and offers no complaints.  Outpatient Prescriptions Prior to Visit  Medication Sig Dispense Refill  . BENICAR 40 MG tablet TAKE 1 TABLET BY MOUTH EVERY DAY 90 tablet 3  . FREESTYLE LITE test strip USE AS DIRECTED 3 TIMES A DAY 100 each 11  . rosuvastatin (CRESTOR) 10 MG tablet Take 1 tablet (10 mg total) by mouth daily. 90 tablet 1  . SitaGLIPtin-MetFORMIN HCl (JANUMET XR) [phone] MG TB24 Take 1 tablet by mouth daily. 90 tablet 1   No facility-administered medications prior to visit.    ROS Review of Systems  Constitutional: Negative.  Negative for fever, chills, diaphoresis, appetite change and fatigue.  HENT: Negative.   Eyes: Negative.   Respiratory: Negative.  Negative for apnea, cough, choking, chest tightness, shortness of breath, wheezing and stridor.   Cardiovascular: Negative.  Negative for chest pain, palpitations and leg swelling.  Gastrointestinal: Negative.  Negative for nausea, vomiting, abdominal pain, diarrhea, constipation and blood in stool.  Endocrine: Negative.  Negative for polydipsia, polyphagia and polyuria.  Genitourinary: Negative.  Negative for difficulty urinating.  Musculoskeletal: Negative.  Negative for myalgias, back pain, joint swelling, arthralgias and neck pain.  Skin: Negative.   Allergic/Immunologic: Negative.   Neurological: Negative.   Hematological: Negative.  Negative for adenopathy. Does not bruise/bleed easily.  Psychiatric/Behavioral: Negative.     Objective:  BP 122/80 mmHg  Pulse 85  Temp(Src) 97.6 F (36.4 C) (Oral)  Ht 5\' 10"  (1.778 m)  Wt 190 lb (86.183 kg)  BMI 27.26 kg/m2  SpO2 96%  BP Readings from Last 3 Encounters:  08/21/14 122/80  10/23/13 130/80  06/27/13 150/100    Wt Readings from Last 3 Encounters:    08/21/14 190 lb (86.183 kg)  10/23/13 191 lb 3.2 oz (86.728 kg)  06/27/13 192 lb (87.091 kg)    Physical Exam  Constitutional: He is oriented to person, place, and time. He appears well-developed and well-nourished. No distress.  HENT:  Head: Normocephalic and atraumatic.  Mouth/Throat: Oropharynx is clear and moist. No oropharyngeal exudate.  Eyes: Conjunctivae are normal. Right eye exhibits no discharge. Left eye exhibits no discharge. No scleral icterus.  Neck: Normal range of motion. Neck supple. No JVD present. No tracheal deviation present. No thyromegaly present.  Cardiovascular: Normal rate, regular rhythm, normal heart sounds and intact distal pulses.  Exam reveals no gallop and no friction rub.   No murmur heard. Pulmonary/Chest: Effort normal and breath sounds normal. No stridor. No respiratory distress. He has no wheezes. He has no rales. He exhibits no tenderness.  Abdominal: Soft. Bowel sounds are normal. He exhibits no distension and no mass. There is no tenderness. There is no rebound and no guarding. Hernia confirmed negative in the right inguinal area and confirmed negative in the left inguinal area.  Genitourinary: Rectum normal, prostate normal, testes normal and penis normal. Rectal exam shows no external hemorrhoid, no internal hemorrhoid, no fissure, no mass, no tenderness and anal tone normal. Guaiac negative stool. Prostate is not enlarged and not tender. Right testis shows no mass, no swelling and no tenderness. Right testis is descended. Left testis shows no mass, no swelling and no tenderness. Left testis is descended. Uncircumcised. No phimosis, paraphimosis, hypospadias, penile erythema or penile tenderness. No discharge  found.  Musculoskeletal: Normal range of motion. He exhibits no edema or tenderness.  Lymphadenopathy:    He has no cervical adenopathy.       Right: No inguinal adenopathy present.       Left: No inguinal adenopathy present.  Neurological: He is  oriented to person, place, and time.  Skin: Skin is warm and dry. No rash noted. He is not diaphoretic. No erythema. No pallor.  Psychiatric: He has a normal mood and affect. His behavior is normal. Judgment and thought content normal.  Vitals reviewed.   Lab Results  Component Value Date   WBC 4.4 08/21/2014   HGB 15.0 08/21/2014   HCT 44.3 08/21/2014   PLT 177.0 08/21/2014   GLUCOSE 159* 08/21/2014   CHOL 162 08/21/2014   TRIG 98.0 08/21/2014   HDL 53.30 08/21/2014   LDLDIRECT 183.6 01/31/2011   LDLCALC 89 08/21/2014   ALT 24 08/21/2014   AST 16 08/21/2014   NA 137 08/21/2014   K 4.2 08/21/2014   CL 105 08/21/2014   CREATININE 0.99 08/21/2014   BUN 16 08/21/2014   CO2 26 08/21/2014   TSH 5.25* 08/21/2014   PSA 0.00* 08/21/2014   HGBA1C 7.8* 08/21/2014   MICROALBUR 2.9* 08/21/2014    No results found.  Assessment & Plan:   Benjimin was seen today for annual exam and diabetes.  Diagnoses and all orders for this visit:  Type II diabetes mellitus with manifestations - his A1C is up to 7.8%, will add toujeo to his regimen and have asked him to go for diabetic education Orders: -     Microalbumin / creatinine urine ratio; Future -     Hemoglobin A1c; Future -     Ambulatory referral to Ophthalmology  Routine general medical examination at a health care facility - exam done, labs reviewed, vaccines were reviewed and updated, pt ed material was given  Orders: -     Lipid panel; Future -     Comprehensive metabolic panel; Future -     CBC with Differential/Platelet; Future -     PSA; Future -     TSH; Future -     Urinalysis, Routine w reflex microscopic (not at [facility]); Future -     Hepatitis C antibody; Future  Need for prophylactic vaccination against Streptococcus pneumoniae (pneumococcus) Orders: -     Pneumococcal conjugate vaccine 13-valent IM  Need for prophylactic vaccination and inoculation against unspecified single disease Orders: -     Varicella-zoster  vaccine subcutaneous   I am having Mr. Tutson maintain his FREESTYLE LITE, BENICAR, rosuvastatin, and SitaGLIPtin-MetFORMIN HCl.  No orders of the defined types were placed in this encounter.   See AVS for instructions about healthy living and anticipatory guidance.  Follow-up: Return in about 4 months (around 12/21/2014).  [name], MD

## 2014-08-21 NOTE — Patient Instructions (Signed)

## 2014-08-22 ENCOUNTER — Telehealth: Payer: Self-pay | Admitting: Internal Medicine

## 2014-08-22 LAB — HEPATITIS C ANTIBODY: HCV Ab: NEGATIVE

## 2014-08-22 NOTE — Telephone Encounter (Signed)
Please call patient. He has questions about Insulin Glargine (TOUJEO SOLOSTAR) 300 UNIT/ML SOPN [[id number]] that Dr. [name] prescribed

## 2014-08-25 NOTE — Telephone Encounter (Signed)
LVM for pt to call back as soon as possible.   

## 2014-08-25 NOTE — Addendum Note (Signed)
Addended by: [name] on: 08/25/2014 08:08 AM   Modules accepted: Miquel Dunn

## 2014-09-29 ENCOUNTER — Encounter: Payer: Self-pay | Admitting: Dietician

## 2014-09-29 ENCOUNTER — Encounter: Payer: 59 | Attending: Internal Medicine | Admitting: Dietician

## 2014-09-29 VITALS — Ht 70.0 in | Wt 192.0 lb

## 2014-09-29 DIAGNOSIS — Z713 Dietary counseling and surveillance: Secondary | ICD-10-CM | POA: Insufficient documentation

## 2014-09-29 DIAGNOSIS — Z6827 Body mass index (BMI) 27.0-27.9, adult: Secondary | ICD-10-CM | POA: Diagnosis not present

## 2014-09-29 DIAGNOSIS — E118 Type 2 diabetes mellitus with unspecified complications: Secondary | ICD-10-CM | POA: Diagnosis not present

## 2014-09-29 NOTE — Patient Instructions (Signed)
Keep up the exercise.  At least 30 minutes most days.  Walking and biking are great! Read the labels and watch the portion sizes. Aim for 3 Carb Choices per meal (45 grams) +/- 1 either way  Aim for 0-1 Carbs per snack if hungry  Include protein in moderation with your meals and snacks Check your blood sugar as the doctor recommends. Consider an experiment.  Check your blood sugar in the morning before breakfast then 2 yours after your first bite.  What is the difference?  Should you have more protein or less carbohydrates?

## 2014-09-29 NOTE — Progress Notes (Signed)
Diabetes Self-Management Education  Visit Type: First/Initial  Appt. Start Time: 0915 Appt. End Time: 1030  09/29/2014  [name], identified by name and date of birth, is a 64 y.o. male with a diagnosis of Diabetes: Type 2.  Other people present during visit:  Patient   Patient retired Friday.  He worked for [employer] rotating 12 hours shifts night/day.  He is hoping that decreased stress and better routine will improve glycemic control.  He wishes to decrease his medication intake.  He last took the diabetes classes here in 2013.  HgbA1C has been increasing over the past year.  He lives with his wife who is an Therapist, sports on the [place] [facility] at [facility].  Patient states that they often eat out and that he tends to overeat more when his wife cooks.  He overeats despite not being hungry.    ASSESSMENT  Height 5\' 10"  (1.778 m), weight 192 lb (87.091 kg). Body mass index is 27.55 kg/(m^2).  Initial Visit Information:  Are you currently following a meal plan?: Yes What type of meal plan do you follow?: decreased carbs Are you taking your medications as prescribed?: Yes Are you checking your feet?: Yes How many days per week are you checking your feet?: 5 How often do you need to have someone help you when you read instructions, pamphlets, or other written materials from your doctor or pharmacy?: 1 - Never What is the last grade level you completed in school?: HS education  Psychosocial:     Patient Belief/Attitude about Diabetes: Motivated to manage diabetes Self-care barriers: None Self-management support: Doctor's office, Family Other persons present: Patient Patient Concerns: Healthy Lifestyle, Nutrition/Meal planning, Other (comment), Weight Control (With improvements of my exercise, continuing to eat healtthy and lose weight can I get off of some of my medications?  "this is my hope") Special Needs: None Preferred Learning Style: No preference indicated Learning  Readiness: Ready  Complications:   Last HgB A1C per patient/outside source: 7.8 mg/dL (08/21/14 increased from 6.9 10/23/13) How often do you check your blood sugar?: 1-2 times/day Fasting Blood glucose range (mg/dL): 70-129 Number of hypoglycemic episodes per month: 0 Number of hyperglycemic episodes per week: 0 Have you had a dilated eye exam in the past 12 months?: Yes Have you had a dental exam in the past 12 months?: Yes  Diet Intake:  Breakfast: sugar crisp cereal or raisin bran crunch and 2% milk OR white toast with jam OR eggs and toast once per week (6:00 am) Lunch: Out to eat [place] (Pad [place]- noodle dish) OR Hops hamburgers OR 1/2 sandwhich and soup (11:30-12:00) Dinner: Homeade tomato soup  or other soup OR spaghetti (5-6) Snack (evening): cookie or real cheese and crackers Beverage(s): 2% milk, craft beer, sparkling water, rare diet gingerale  Exercise:  Exercise: Light (walking / raking leaves) (Walks dogs 30-40 minutes per day.  He is going to start riding bikes) Light Exercise amount of time (min / week): 150  Individualized Plan for Diabetes Self-Management Training:   Learning Objective:  Patient will have a greater understanding of diabetes self-management.  Patient education plan per assessed needs and concerns is to attend individual sessions for     Education Topics Reviewed with Patient Today:  Explored patient's options for treatment of their diabetes, Definition of diabetes, type 1 and 2, and the diagnosis of diabetes Role of diet in the treatment of diabetes and the relationship between the three main macronutrients and blood glucose level,  Food label reading, portion sizes and measuring food., Carbohydrate counting, Reviewed blood glucose goals for pre and post meals and how to evaluate the patients' food intake on their blood glucose level., Meal options for control of blood glucose level and chronic complications. Role of exercise on diabetes management,  blood pressure control and cardiac health.   Identified appropriate SMBG and/or A1C goals., Daily foot exams, Yearly dilated eye exam, Taught/discussed recording of test results and interpretation of SMBG.   Relationship between chronic complications and blood glucose control, Assessed and discussed foot care and prevention of foot problems, Dental care Role of stress on diabetes   Lifestyle issues that need to be addressed for better diabetes care  PATIENTS GOALS/Plan (Developed by the patient):  Nutrition: Follow meal plan discussed, General guidelines for healthy choices and portions discussed Physical Activity: Exercise 5-7 days per week, 45 minutes per day Monitoring : test my blood glucose as discussed (note x per day with comment) Reducing Risk: examine blood glucose patterns Health Coping: Not Applicable  Plan:   Patient Instructions  Keep up the exercise.  At least 30 minutes most days.  Walking and biking are great! Read the labels and watch the portion sizes. Aim for 3 Carb Choices per meal (45 grams) +/- 1 either way  Aim for 0-1 Carbs per snack if hungry  Include protein in moderation with your meals and snacks Check your blood sugar as the doctor recommends. Consider an experiment.  Check your blood sugar in the morning before breakfast then 2 yours after your first bite.  What is the difference?  Should you have more protein or less carbohydrates?      Expected Outcomes:  Demonstrated interest in learning. Expect positive outcomes  Education material provided: Living Well with Diabetes, Food label handouts, A1C conversion sheet, Meal plan card and My Plate Breakfast  If problems or questions, patient to contact team via:  Phone and Email  Future DSME appointment: PRN

## 2014-10-02 ENCOUNTER — Other Ambulatory Visit: Payer: Self-pay | Admitting: Geriatric Medicine

## 2014-10-02 LAB — HM DIABETES EYE EXAM

## 2014-10-02 MED ORDER — GLUCOSE BLOOD VI STRP: ORAL_STRIP | Status: DC

## 2014-10-08 ENCOUNTER — Encounter: Payer: Self-pay | Admitting: Internal Medicine

## 2014-10-13 NOTE — Addendum Note (Signed)
Addended by: [name] on: 10/13/2014 08:09 AM   Modules accepted: Miquel Dunn

## 2014-11-25 ENCOUNTER — Other Ambulatory Visit (INDEPENDENT_AMBULATORY_CARE_PROVIDER_SITE_OTHER): Payer: 59

## 2014-11-25 ENCOUNTER — Ambulatory Visit (INDEPENDENT_AMBULATORY_CARE_PROVIDER_SITE_OTHER): Payer: 59 | Admitting: Internal Medicine

## 2014-11-25 ENCOUNTER — Encounter: Payer: Self-pay | Admitting: Internal Medicine

## 2014-11-25 VITALS — BP 120/74 | HR 91 | Temp 98.1°F | Resp 16 | Ht 70.0 in | Wt 198.0 lb

## 2014-11-25 DIAGNOSIS — Z23 Encounter for immunization: Secondary | ICD-10-CM | POA: Diagnosis not present

## 2014-11-25 DIAGNOSIS — E118 Type 2 diabetes mellitus with unspecified complications: Secondary | ICD-10-CM

## 2014-11-25 DIAGNOSIS — I10 Essential (primary) hypertension: Secondary | ICD-10-CM

## 2014-11-25 LAB — BASIC METABOLIC PANEL
BUN: 17 mg/dL (ref 6–23)
CHLORIDE: 104 meq/L (ref 96–112)
CO2: 27 mEq/L (ref 19–32)
Calcium: 9.7 mg/dL (ref 8.4–10.5)
Creatinine, Ser: 1.06 mg/dL (ref 0.40–1.50)
GFR: 74.72 mL/min (ref >60.00)
Glucose, Bld: 111 mg/dL — ABNORMAL HIGH (ref 70–99)
POTASSIUM: 4.3 meq/L (ref 3.5–5.1)
SODIUM: 139 meq/L (ref 135–145)

## 2014-11-25 LAB — HEMOGLOBIN A1C: HEMOGLOBIN A1C: 6.5 % (ref 4.6–6.5)

## 2014-11-25 NOTE — Progress Notes (Signed)
Subjective:  Patient ID: Clifford Brown, male    DOB: October 30, 1950  Age: 64 y.o. MRN: [id number]  CC: Hypertension and Diabetes   HPI Clifford Brown presents for follow-up on hypertension and diabetes. He feels well and offers no complaints.  Outpatient Prescriptions Prior to Visit  Medication Sig Dispense Refill  . BENICAR 40 MG tablet TAKE 1 TABLET BY MOUTH EVERY DAY 90 tablet 3  . glucose blood (FREESTYLE LITE) test strip USE AS DIRECTED 3 TIMES A DAY 100 each 11  . Insulin Glargine (TOUJEO SOLOSTAR) 300 UNIT/ML SOPN Inject 20 Units into the skin daily. 1.5 mL 11  . rosuvastatin (CRESTOR) 10 MG tablet Take 1 tablet (10 mg total) by mouth daily. 90 tablet 1  . SitaGLIPtin-MetFORMIN HCl (JANUMET XR) [phone] MG TB24 Take 1 tablet by mouth daily. 90 tablet 1   No facility-administered medications prior to visit.    ROS Review of Systems  Constitutional: Negative.  Negative for fever, chills, diaphoresis, appetite change and fatigue.  HENT: Negative.   Eyes: Negative.   Respiratory: Negative.  Negative for cough, choking, chest tightness, shortness of breath and stridor.   Cardiovascular: Negative.  Negative for chest pain, palpitations and leg swelling.  Gastrointestinal: Negative.  Negative for nausea, vomiting, abdominal pain, diarrhea, constipation and blood in stool.  Endocrine: Negative.  Negative for polydipsia, polyphagia and polyuria.  Genitourinary: Negative.   Musculoskeletal: Negative.  Negative for myalgias, back pain and arthralgias.  Skin: Negative.  Negative for rash.  Allergic/Immunologic: Negative.   Neurological: Negative.  Negative for dizziness, syncope, speech difficulty, weakness and light-headedness.  Hematological: Negative.   Psychiatric/Behavioral: Negative.     Objective:  BP 120/74 mmHg  Pulse 91  Temp(Src) 98.1 F (36.7 C) (Oral)  Resp 16  Ht 5\' 10"  (1.778 m)  Wt 198 lb (89.812 kg)  BMI 28.41 kg/m2  SpO2 96%  BP Readings from Last 3  Encounters:  11/25/14 120/74  08/21/14 122/80  10/23/13 130/80    Wt Readings from Last 3 Encounters:  11/25/14 198 lb (89.812 kg)  09/29/14 192 lb (87.091 kg)  08/21/14 190 lb (86.183 kg)    Physical Exam  Constitutional: He is oriented to person, place, and time. No distress.  HENT:  Mouth/Throat: Oropharynx is clear and moist. No oropharyngeal exudate.  Eyes: Conjunctivae are normal. Right eye exhibits no discharge. Left eye exhibits no discharge. No scleral icterus.  Neck: Normal range of motion. Neck supple. No JVD present. No tracheal deviation present. No thyromegaly present.  Cardiovascular: Normal rate, regular rhythm, normal heart sounds and intact distal pulses.  Exam reveals no gallop and no friction rub.   No murmur heard. Pulmonary/Chest: Effort normal and breath sounds normal. No stridor. No respiratory distress. He has no wheezes. He has no rales. He exhibits no tenderness.  Abdominal: Soft. Bowel sounds are normal. He exhibits no distension and no mass. There is no tenderness. There is no rebound and no guarding.  Musculoskeletal: Normal range of motion. He exhibits no edema or tenderness.  Lymphadenopathy:    He has no cervical adenopathy.  Neurological: He is oriented to person, place, and time.  Skin: Skin is warm and dry. No rash noted. He is not diaphoretic. No erythema. No pallor.  Psychiatric: He has a normal mood and affect. His behavior is normal. Judgment and thought content normal.  Vitals reviewed.   Lab Results  Component Value Date   WBC 4.4 08/21/2014   HGB 15.0 08/21/2014   HCT 44.3 08/21/2014  PLT 177.0 08/21/2014   GLUCOSE 111* 11/25/2014   CHOL 162 08/21/2014   TRIG 98.0 08/21/2014   HDL 53.30 08/21/2014   LDLDIRECT 183.6 01/31/2011   LDLCALC 89 08/21/2014   ALT 24 08/21/2014   AST 16 08/21/2014   NA 139 11/25/2014   K 4.3 11/25/2014   CL 104 11/25/2014   CREATININE 1.06 11/25/2014   BUN 17 11/25/2014   CO2 27 11/25/2014   TSH  5.25* 08/21/2014   PSA 0.00* 08/21/2014   HGBA1C 6.5 11/25/2014   MICROALBUR 2.9* 08/21/2014    No results found.  Assessment & Plan:   Zalan was seen today for hypertension and diabetes.  Diagnoses and all orders for this visit:  Type II diabetes mellitus with manifestations- his blood sugars are well-controlled, his renal function is stable. -     Basic metabolic panel; Future -     Hemoglobin A1c; Future  Essential hypertension, benign- his blood pressure is well-controlled, his electrolytes and renal function are stable. -     Basic metabolic panel; Future  Need for influenza vaccination -     Flu Vaccine QUAD 36+ mos IM   I am having Clifford Brown maintain his BENICAR, rosuvastatin, SitaGLIPtin-MetFORMIN HCl, Insulin Glargine, glucose blood, and B-D ULTRAFINE III SHORT PEN.  Meds ordered this encounter  Medications  . B-D ULTRAFINE III SHORT PEN 31G X 8 MM MISC    Sig: USE DAILY WITH TOUJEO    Refill:  4     Follow-up: Return in about 4 months (around 03/27/2015).  Clifford Calico, MD

## 2014-11-25 NOTE — Progress Notes (Signed)
Pre visit review using our clinic review tool, if applicable. No additional management support is needed unless otherwise documented below in the visit note. 

## 2014-11-25 NOTE — Patient Instructions (Signed)

## 2015-01-07 ENCOUNTER — Other Ambulatory Visit: Payer: Self-pay

## 2015-01-07 MED ORDER — ROSUVASTATIN CALCIUM 10 MG PO TABS: 10.0000 mg | ORAL_TABLET | Freq: Every day | ORAL | Status: DC

## 2015-03-30 ENCOUNTER — Encounter: Payer: Self-pay | Admitting: Internal Medicine

## 2015-03-30 ENCOUNTER — Other Ambulatory Visit (INDEPENDENT_AMBULATORY_CARE_PROVIDER_SITE_OTHER): Payer: 59

## 2015-03-30 ENCOUNTER — Ambulatory Visit (INDEPENDENT_AMBULATORY_CARE_PROVIDER_SITE_OTHER): Payer: 59 | Admitting: Internal Medicine

## 2015-03-30 VITALS — BP 128/88 | HR 79 | Temp 97.9°F | Resp 16 | Ht 70.0 in | Wt 208.0 lb

## 2015-03-30 DIAGNOSIS — R7989 Other specified abnormal findings of blood chemistry: Secondary | ICD-10-CM

## 2015-03-30 DIAGNOSIS — I10 Essential (primary) hypertension: Secondary | ICD-10-CM

## 2015-03-30 DIAGNOSIS — R946 Abnormal results of thyroid function studies: Secondary | ICD-10-CM

## 2015-03-30 DIAGNOSIS — E118 Type 2 diabetes mellitus with unspecified complications: Secondary | ICD-10-CM

## 2015-03-30 DIAGNOSIS — E039 Hypothyroidism, unspecified: Secondary | ICD-10-CM | POA: Insufficient documentation

## 2015-03-30 LAB — BASIC METABOLIC PANEL
BUN: 16 mg/dL (ref 6–23)
CHLORIDE: 106 meq/L (ref 96–112)
CO2: 22 mEq/L (ref 19–32)
CREATININE: 1.08 mg/dL (ref 0.40–1.50)
Calcium: 9.9 mg/dL (ref 8.4–10.5)
GFR: 73.05 mL/min (ref >60.00)
Glucose, Bld: 107 mg/dL — ABNORMAL HIGH (ref 70–99)
Potassium: 4.7 mEq/L (ref 3.5–5.1)
SODIUM: 142 meq/L (ref 135–145)

## 2015-03-30 LAB — TSH: TSH: 6.63 u[IU]/mL — ABNORMAL HIGH (ref 0.35–4.50)

## 2015-03-30 LAB — T4, FREE: Free T4: 0.69 ng/dL (ref 0.60–1.60)

## 2015-03-30 LAB — HEMOGLOBIN A1C: HEMOGLOBIN A1C: 6.8 % — AB (ref 4.6–6.5)

## 2015-03-30 MED ORDER — ASPIRIN EC 81 MG PO TBEC: 81.0000 mg | DELAYED_RELEASE_TABLET | Freq: Every day | ORAL | Status: DC

## 2015-03-30 MED ORDER — OLMESARTAN MEDOXOMIL 40 MG PO TABS
40.0000 mg | ORAL_TABLET | Freq: Every day | ORAL | Status: DC
Start: 2015-03-30 — End: 2016-03-30

## 2015-03-30 NOTE — Progress Notes (Signed)
Pre visit review using our clinic review tool, if applicable. No additional management support is needed unless otherwise documented below in the visit note. 

## 2015-03-30 NOTE — Progress Notes (Signed)
Subjective:  Patient ID: Clifford Brown, male    DOB: 08-18-1950  Age: 65 y.o. MRN: [id number]  CC: Hypothyroidism and Diabetes   HPI Clifford Brown presents for follow-up on possible hypothyroidism and diabetes. He complains of weight gain and polyuria. He offers no other complaints.  Outpatient Prescriptions Prior to Visit  Medication Sig Dispense Refill  . B-D ULTRAFINE III SHORT PEN 31G X 8 MM MISC USE DAILY WITH TOUJEO  4  . glucose blood (FREESTYLE LITE) test strip USE AS DIRECTED 3 TIMES A DAY 100 each 11  . Insulin Glargine (TOUJEO SOLOSTAR) 300 UNIT/ML SOPN Inject 20 Units into the skin daily. 1.5 mL 11  . rosuvastatin (CRESTOR) 10 MG tablet Take 1 tablet (10 mg total) by mouth daily. 90 tablet 3  . SitaGLIPtin-MetFORMIN HCl (JANUMET XR) [phone] MG TB24 Take 1 tablet by mouth daily. 90 tablet 1  . BENICAR 40 MG tablet TAKE 1 TABLET BY MOUTH EVERY DAY 90 tablet 3   No facility-administered medications prior to visit.    ROS Review of Systems  Constitutional: Positive for unexpected weight change. Negative for fever, chills, diaphoresis, appetite change and fatigue.  HENT: Negative.   Eyes: Negative.  Negative for visual disturbance.  Respiratory: Negative.  Negative for cough, choking, chest tightness, shortness of breath and stridor.   Cardiovascular: Negative.  Negative for chest pain, palpitations and leg swelling.  Gastrointestinal: Negative.  Negative for nausea, vomiting, abdominal pain, diarrhea, constipation and blood in stool.  Endocrine: Positive for polyuria. Negative for polydipsia and polyphagia.  Genitourinary: Negative.  Negative for difficulty urinating.  Musculoskeletal: Positive for neck pain. Negative for myalgias, back pain and arthralgias.  Skin: Negative.  Negative for color change, pallor and rash.  Allergic/Immunologic: Negative.   Neurological: Negative.  Negative for dizziness, speech difficulty, weakness and light-headedness.  Hematological:  Negative.  Negative for adenopathy. Does not bruise/bleed easily.  Psychiatric/Behavioral: Negative.     Objective:  BP 128/88 mmHg  Pulse 79  Temp(Src) 97.9 F (36.6 C) (Oral)  Resp 16  Ht 5\' 10"  (1.778 m)  Wt 208 lb (94.348 kg)  BMI 29.84 kg/m2  SpO2 96%  BP Readings from Last 3 Encounters:  03/30/15 128/88  11/25/14 120/74  08/21/14 122/80    Wt Readings from Last 3 Encounters:  03/30/15 208 lb (94.348 kg)  11/25/14 198 lb (89.812 kg)  09/29/14 192 lb (87.091 kg)    Physical Exam  Constitutional: He is oriented to person, place, and time. He appears well-developed and well-nourished. No distress.  HENT:  Head: Normocephalic and atraumatic.  Mouth/Throat: Oropharynx is clear and moist. No oropharyngeal exudate.  Eyes: Conjunctivae are normal. Right eye exhibits no discharge. Left eye exhibits no discharge. No scleral icterus.  Neck: Normal range of motion. No JVD present. No tracheal deviation present. No thyromegaly present.  Cardiovascular: Normal rate, regular rhythm, normal heart sounds and intact distal pulses.  Exam reveals no gallop and no friction rub.   No murmur heard. Pulmonary/Chest: Effort normal and breath sounds normal. No stridor. No respiratory distress. He has no wheezes. He has no rales. He exhibits no tenderness.  Abdominal: Soft. Bowel sounds are normal. He exhibits no distension and no mass. There is no tenderness. There is no rebound and no guarding.  Musculoskeletal: Normal range of motion. He exhibits no edema.  Lymphadenopathy:    He has no cervical adenopathy.  Neurological: He is oriented to person, place, and time.  Skin: Skin is warm and dry. No rash  noted. He is not diaphoretic. No erythema. No pallor.  Psychiatric: He has a normal mood and affect. His behavior is normal. Judgment and thought content normal.  Vitals reviewed.   Lab Results  Component Value Date   WBC 4.4 08/21/2014   HGB 15.0 08/21/2014   HCT 44.3 08/21/2014   PLT  177.0 08/21/2014   GLUCOSE 107* 03/30/2015   CHOL 162 08/21/2014   TRIG 98.0 08/21/2014   HDL 53.30 08/21/2014   LDLDIRECT 183.6 01/31/2011   LDLCALC 89 08/21/2014   ALT 24 08/21/2014   AST 16 08/21/2014   NA 142 03/30/2015   K 4.7 03/30/2015   CL 106 03/30/2015   CREATININE 1.08 03/30/2015   BUN 16 03/30/2015   CO2 22 03/30/2015   TSH 6.63* 03/30/2015   PSA 0.00* 08/21/2014   HGBA1C 6.8* 03/30/2015   MICROALBUR 2.9* 08/21/2014    No results found.  Assessment & Plan:   Clifford Brown was seen today for hypothyroidism and diabetes.  Diagnoses and all orders for this visit:  TSH elevation-  His TPO antibody is negative, his free T4 is borderline low and his TSH is increasing, I think he has developed hypothyroidism and will treat him with low-dose thyroid replacement therapy. -     T4, free; Future -     TSH; Future -     Thyroid peroxidase antibody; Future -     levothyroxine (SYNTHROID, LEVOTHROID) 50 MCG tablet; Take 1 tablet (50 mcg total) by mouth daily.  Type 2 diabetes mellitus with complication, without long-term current use of insulin (Clifford Brown)-  His blood sugars are well-controlled -     Basic metabolic panel; Future -     Hemoglobin A1c; Future -     olmesartan (BENICAR) 40 MG tablet; Take 1 tablet (40 mg total) by mouth daily. -     aspirin EC 81 MG tablet; Take 1 tablet (81 mg total) by mouth daily.  Essential hypertension, benign-  His blood pressure is well-controlled, lites and renal function are stable. -     Basic metabolic panel; Future -     olmesartan (BENICAR) 40 MG tablet; Take 1 tablet (40 mg total) by mouth daily.   I have changed Clifford Brown's BENICAR to olmesartan. I am also having him start on aspirin EC and levothyroxine. Additionally, I am having him maintain his SitaGLIPtin-MetFORMIN HCl, Insulin Glargine, glucose blood, B-D ULTRAFINE III SHORT PEN, and rosuvastatin.  Meds ordered this encounter  Medications  . olmesartan (BENICAR) 40 MG tablet     Sig: Take 1 tablet (40 mg total) by mouth daily.    Dispense:  90 tablet    Refill:  3  . aspirin EC 81 MG tablet    Sig: Take 1 tablet (81 mg total) by mouth daily.    Dispense:  90 tablet    Refill:  3  . levothyroxine (SYNTHROID, LEVOTHROID) 50 MCG tablet    Sig: Take 1 tablet (50 mcg total) by mouth daily.    Dispense:  90 tablet    Refill:  1     Follow-up: Return in about 4 months (around 07/28/2015).  [name], MD

## 2015-03-30 NOTE — Patient Instructions (Signed)

## 2015-03-31 ENCOUNTER — Encounter: Payer: Self-pay | Admitting: Internal Medicine

## 2015-03-31 LAB — THYROID PEROXIDASE ANTIBODY: Thyroperoxidase Ab SerPl-aCnc: <1 IU/mL (ref <9)

## 2015-03-31 MED ORDER — LEVOTHYROXINE SODIUM 50 MCG PO TABS: 50.0000 ug | ORAL_TABLET | Freq: Every day | ORAL | Status: DC

## 2015-04-17 ENCOUNTER — Telehealth: Payer: Self-pay | Admitting: Internal Medicine

## 2015-04-17 DIAGNOSIS — E118 Type 2 diabetes mellitus with unspecified complications: Secondary | ICD-10-CM

## 2015-04-17 NOTE — Telephone Encounter (Signed)
Pt is wanting to know if there is a generic of SitaGLIPtin-MetFORMIN HCl (JANUMET XR) [id number] MG TB24 [id number]  The price has gone up. Pharmacy is [facility] [address]

## 2015-04-19 NOTE — Telephone Encounter (Signed)
There is a generic option but it's made by different company. Does he want to change to the different brand?

## 2015-04-20 MED ORDER — ALOGLIPTIN-METFORMIN HCL 12.5-500 MG PO TABS: 1.0000 | ORAL_TABLET | Freq: Two times a day (BID) | ORAL | Status: DC

## 2015-04-20 NOTE — Telephone Encounter (Signed)
changed

## 2015-04-20 NOTE — Telephone Encounter (Signed)
Patient advised.

## 2015-04-20 NOTE — Telephone Encounter (Signed)
Yes, patient would like to try new generic---[facility] to [facility] [address]

## 2015-07-28 ENCOUNTER — Other Ambulatory Visit (INDEPENDENT_AMBULATORY_CARE_PROVIDER_SITE_OTHER): Payer: 59

## 2015-07-28 ENCOUNTER — Ambulatory Visit (INDEPENDENT_AMBULATORY_CARE_PROVIDER_SITE_OTHER): Payer: 59 | Admitting: Internal Medicine

## 2015-07-28 ENCOUNTER — Encounter: Payer: Self-pay | Admitting: Internal Medicine

## 2015-07-28 VITALS — BP 144/90 | HR 82 | Temp 98.1°F | Resp 16 | Ht 70.0 in | Wt 205.0 lb

## 2015-07-28 DIAGNOSIS — R946 Abnormal results of thyroid function studies: Secondary | ICD-10-CM | POA: Diagnosis not present

## 2015-07-28 DIAGNOSIS — I10 Essential (primary) hypertension: Secondary | ICD-10-CM

## 2015-07-28 DIAGNOSIS — J301 Allergic rhinitis due to pollen: Secondary | ICD-10-CM | POA: Diagnosis not present

## 2015-07-28 DIAGNOSIS — R7989 Other specified abnormal findings of blood chemistry: Secondary | ICD-10-CM

## 2015-07-28 DIAGNOSIS — E118 Type 2 diabetes mellitus with unspecified complications: Secondary | ICD-10-CM

## 2015-07-28 LAB — CBC WITH DIFFERENTIAL/PLATELET
BASOS PCT: 0.7 % (ref 0.0–3.0)
Basophils Absolute: 0 10*3/uL (ref 0.0–0.1)
EOS ABS: 0.1 10*3/uL (ref 0.0–0.7)
Eosinophils Relative: 1.8 % (ref 0.0–5.0)
HEMATOCRIT: 45.3 % (ref 39.0–52.0)
Hemoglobin: 15.7 g/dL (ref 13.0–17.0)
LYMPHS PCT: 22.5 % (ref 12.0–46.0)
Lymphs Abs: 1.4 10*3/uL (ref 0.7–4.0)
MCHC: 34.5 g/dL (ref 30.0–36.0)
MCV: 88 fl (ref 78.0–100.0)
MONOS PCT: 5.5 % (ref 3.0–12.0)
Monocytes Absolute: 0.3 10*3/uL (ref 0.1–1.0)
NEUTROS ABS: 4.2 10*3/uL (ref 1.4–7.7)
Neutrophils Relative %: 69.5 % (ref 43.0–77.0)
PLATELETS: 188 10*3/uL (ref 150.0–400.0)
RBC: 5.15 Mil/uL (ref 4.22–5.81)
RDW: 13.2 % (ref 11.5–15.5)
WBC: 6 10*3/uL (ref 4.0–10.5)

## 2015-07-28 LAB — URINALYSIS, ROUTINE W REFLEX MICROSCOPIC
Bilirubin Urine: NEGATIVE
HGB URINE DIPSTICK: NEGATIVE
KETONES UR: NEGATIVE
LEUKOCYTES UA: NEGATIVE
Nitrite: NEGATIVE
Specific Gravity, Urine: 1.01 (ref 1.000–1.030)
TOTAL PROTEIN, URINE-UPE24: NEGATIVE
URINE GLUCOSE: NEGATIVE
UROBILINOGEN UA: 0.2 (ref 0.0–1.0)
WBC, UA: NONE SEEN — AB (ref 0–?)
pH: 5.5 (ref 5.0–8.0)

## 2015-07-28 LAB — TSH: TSH: 4.73 u[IU]/mL — ABNORMAL HIGH (ref 0.35–4.50)

## 2015-07-28 LAB — MICROALBUMIN / CREATININE URINE RATIO
CREATININE, U: 73.2 mg/dL
MICROALB/CREAT RATIO: 1.1 mg/g (ref 0.0–30.0)
Microalb, Ur: 0.8 mg/dL (ref 0.0–1.9)

## 2015-07-28 LAB — HEMOGLOBIN A1C: Hgb A1c MFr Bld: 7.2 % — ABNORMAL HIGH (ref 4.6–6.5)

## 2015-07-28 LAB — T4, FREE: Free T4: 0.77 ng/dL (ref 0.60–1.60)

## 2015-07-28 LAB — BASIC METABOLIC PANEL
BUN: 13 mg/dL (ref 6–23)
CHLORIDE: 104 meq/L (ref 96–112)
CO2: 25 meq/L (ref 19–32)
CREATININE: 1.04 mg/dL (ref 0.40–1.50)
Calcium: 9.4 mg/dL (ref 8.4–10.5)
GFR: 76.22 mL/min (ref >60.00)
Glucose, Bld: 132 mg/dL — ABNORMAL HIGH (ref 70–99)
Potassium: 4.4 mEq/L (ref 3.5–5.1)
Sodium: 138 mEq/L (ref 135–145)

## 2015-07-28 MED ORDER — AZELASTINE-FLUTICASONE 137-50 MCG/ACT NA SUSP: 2.0000 | Freq: Two times a day (BID) | NASAL | Status: DC

## 2015-07-28 NOTE — Patient Instructions (Signed)
Hypertension Hypertension, commonly called high blood pressure, is when the force of blood pumping through your arteries is too strong. Your arteries are the blood vessels that carry blood from your heart throughout your body. A blood pressure reading consists of a higher number over a lower number, such as 110/72. The higher number (systolic) is the pressure inside your arteries when your heart pumps. The lower number (diastolic) is the pressure inside your arteries when your heart relaxes. Ideally you want your blood pressure below 120/80. Hypertension forces your heart to work harder to pump blood. Your arteries may become narrow or stiff. Having untreated or uncontrolled hypertension can cause heart attack, stroke, kidney disease, and other problems. RISK FACTORS Some risk factors for high blood pressure are controllable. Others are not.  Risk factors you cannot control include:   Race. You may be at higher risk if you are African American.  Age. Risk increases with age.  Gender. Men are at higher risk than women before age 45 years. After age 65, women are at higher risk than men. Risk factors you can control include:  Not getting enough exercise or physical activity.  Being overweight.  Getting too much fat, sugar, calories, or salt in your diet.  Drinking too much alcohol. SIGNS AND SYMPTOMS Hypertension does not usually cause signs or symptoms. Extremely high blood pressure (hypertensive crisis) may cause headache, anxiety, shortness of breath, and nosebleed. DIAGNOSIS To check if you have hypertension, your health care provider will measure your blood pressure while you are seated, with your arm held at the level of your heart. It should be measured at least twice using the same arm. Certain conditions can cause a difference in blood pressure between your right and left arms. A blood pressure reading that is higher than normal on one occasion does not mean that you need treatment. If  it is not clear whether you have high blood pressure, you may be asked to return on a different day to have your blood pressure checked again. Or, you may be asked to monitor your blood pressure at home for 1 or more weeks. TREATMENT Treating high blood pressure includes making lifestyle changes and possibly taking medicine. Living a healthy lifestyle can help lower high blood pressure. You may need to change some of your habits. Lifestyle changes may include:  Following the DASH diet. This diet is high in fruits, vegetables, and whole grains. It is low in salt, red meat, and added sugars.  Keep your sodium intake below 2,300 mg per day.  Getting at least 30-45 minutes of aerobic exercise at least 4 times per week.  Losing weight if necessary.  Not smoking.  Limiting alcoholic beverages.  Learning ways to reduce stress. Your health care provider may prescribe medicine if lifestyle changes are not enough to get your blood pressure under control, and if one of the following is true:  You are 18-59 years of age and your systolic blood pressure is above 140.  You are 60 years of age or older, and your systolic blood pressure is above 150.  Your diastolic blood pressure is above 90.  You have diabetes, and your systolic blood pressure is over 140 or your diastolic blood pressure is over 90.  You have kidney disease and your blood pressure is above 140/90.  You have heart disease and your blood pressure is above 140/90. Your personal target blood pressure may vary depending on your medical conditions, your age, and other factors. HOME CARE INSTRUCTIONS    Have your blood pressure rechecked as directed by your health care provider.   Take medicines only as directed by your health care provider. Follow the directions carefully. Blood pressure medicines must be taken as prescribed. The medicine does not work as well when you skip doses. Skipping doses also puts you at risk for  problems.  Do not smoke.   Monitor your blood pressure at home as directed by your health care provider. SEEK MEDICAL CARE IF:   You think you are having a reaction to medicines taken.  You have recurrent headaches or feel dizzy.  You have swelling in your ankles.  You have trouble with your vision. SEEK IMMEDIATE MEDICAL CARE IF:  You develop a severe headache or confusion.  You have unusual weakness, numbness, or feel faint.  You have severe chest or abdominal pain.  You vomit repeatedly.  You have trouble breathing. MAKE SURE YOU:   Understand these instructions.  Will watch your condition.  Will get help right away if you are not doing well or get worse.   This information is not intended to replace advice given to you by your health care provider. Make sure you discuss any questions you have with your health care provider.   Document Released: 02/28/2005 Document Revised: 07/15/2014 Document Reviewed: 12/21/2012 Elsevier Interactive Patient Education 2016 [employer]  

## 2015-07-28 NOTE — Progress Notes (Signed)
Subjective:  Patient ID: Clifford Brown, male    DOB: 1950-12-22  Age: 65 y.o. MRN: [id number]  CC: Diabetes; Hypertension; Hypothyroidism; and Hyperlipidemia   HPI [name] presents for f/up.  He complains of recurrent episodes of runny nose, nasal congestion, and postnasal drip. He has been taking an over-the-counter dose of antihistamines and decongestants which has helped but the decongestants have raise his blood pressure.  His blood pressure is not been adequately well controlled but he denies headache, chest pain, blurred vision, shortness of breath, or edema.  A few months ago he had an elevated TSH levels so I started low-dose levothyroxine, he has not tolerated it well and claims it has caused a few episodes of diarrhea. He has decided to stop taking the levothyroxine and see how he does.  Outpatient Prescriptions Prior to Visit  Medication Sig Dispense Refill  . Alogliptin-Metformin HCl 12.5-500 MG TABS Take 1 tablet by mouth 2 (two) times daily. 180 tablet 1  . aspirin EC 81 MG tablet Take 1 tablet (81 mg total) by mouth daily. 90 tablet 3  . B-D ULTRAFINE III SHORT PEN 31G X 8 MM MISC USE DAILY WITH TOUJEO  4  . glucose blood (FREESTYLE LITE) test strip USE AS DIRECTED 3 TIMES A DAY 100 each 11  . Insulin Glargine (TOUJEO SOLOSTAR) 300 UNIT/ML SOPN Inject 20 Units into the skin daily. 1.5 mL 11  . levothyroxine (SYNTHROID, LEVOTHROID) 50 MCG tablet Take 1 tablet (50 mcg total) by mouth daily. 90 tablet 1  . olmesartan (BENICAR) 40 MG tablet Take 1 tablet (40 mg total) by mouth daily. 90 tablet 3  . rosuvastatin (CRESTOR) 10 MG tablet Take 1 tablet (10 mg total) by mouth daily. 90 tablet 3   No facility-administered medications prior to visit.    ROS Review of Systems  Constitutional: Negative.  Negative for fever, chills, diaphoresis, appetite change and fatigue.  HENT: Positive for congestion, postnasal drip, rhinorrhea and sneezing. Negative for nosebleeds, sinus  pressure, sore throat and trouble swallowing.   Eyes: Negative.   Respiratory: Negative.  Negative for cough, choking, chest tightness, shortness of breath and stridor.   Cardiovascular: Negative.  Negative for chest pain, palpitations and leg swelling.  Gastrointestinal: Positive for diarrhea. Negative for nausea, vomiting, abdominal pain and blood in stool.  Endocrine: Negative.  Negative for polydipsia, polyphagia and polyuria.  Genitourinary: Negative.  Negative for difficulty urinating.  Musculoskeletal: Negative.  Negative for myalgias and arthralgias.  Skin: Negative.  Negative for color change, pallor and rash.  Allergic/Immunologic: Negative.   Neurological: Negative.  Negative for dizziness, tremors, weakness, light-headedness and numbness.  Hematological: Negative.  Negative for adenopathy. Does not bruise/bleed easily.  Psychiatric/Behavioral: Negative.     Objective:  BP 144/90 mmHg  Pulse 82  Temp(Src) 98.1 F (36.7 C) (Oral)  Resp 16  Ht 5\' 10"  (1.778 m)  Wt 205 lb (92.987 kg)  BMI 29.41 kg/m2  SpO2 96%  BP Readings from Last 3 Encounters:  07/28/15 144/90  03/30/15 128/88  11/25/14 120/74    Wt Readings from Last 3 Encounters:  07/28/15 205 lb (92.987 kg)  03/30/15 208 lb (94.348 kg)  11/25/14 198 lb (89.812 kg)    Physical Exam  Constitutional: He is oriented to person, place, and time. No distress.  HENT:  Nose: Mucosal edema and rhinorrhea present. No sinus tenderness. Right sinus exhibits no maxillary sinus tenderness and no frontal sinus tenderness. Left sinus exhibits no maxillary sinus tenderness and no frontal  sinus tenderness.  Mouth/Throat: Oropharynx is clear and moist and mucous membranes are normal. Mucous membranes are not pale, not dry and not cyanotic. No oral lesions. No trismus in the jaw. No uvula swelling. No oropharyngeal exudate, posterior oropharyngeal edema, posterior oropharyngeal erythema or tonsillar abscesses.  Eyes: Conjunctivae  are normal. Right eye exhibits no discharge. Left eye exhibits no discharge. No scleral icterus.  Neck: Normal range of motion. Neck supple. No JVD present. No tracheal deviation present. No thyromegaly present.  Cardiovascular: Normal rate, regular rhythm, normal heart sounds and intact distal pulses.  Exam reveals no gallop and no friction rub.   No murmur heard. Pulmonary/Chest: Effort normal and breath sounds normal. No stridor. No respiratory distress. He has no wheezes. He has no rales. He exhibits no tenderness.  Abdominal: Soft. Bowel sounds are normal. He exhibits no distension and no mass. There is no tenderness. There is no rebound and no guarding.  Musculoskeletal: Normal range of motion. He exhibits no edema or tenderness.  Lymphadenopathy:    He has no cervical adenopathy.  Neurological: He is oriented to person, place, and time.  Skin: Skin is warm and dry. No rash noted. He is not diaphoretic. No erythema. No pallor.  Vitals reviewed.   Lab Results  Component Value Date   WBC 6.0 07/28/2015   HGB 15.7 07/28/2015   HCT 45.3 07/28/2015   PLT 188.0 07/28/2015   GLUCOSE 132* 07/28/2015   CHOL 162 08/21/2014   TRIG 98.0 08/21/2014   HDL 53.30 08/21/2014   LDLDIRECT 183.6 01/31/2011   LDLCALC 89 08/21/2014   ALT 24 08/21/2014   AST 16 08/21/2014   NA 138 07/28/2015   K 4.4 07/28/2015   CL 104 07/28/2015   CREATININE 1.04 07/28/2015   BUN 13 07/28/2015   CO2 25 07/28/2015   TSH 4.73* 07/28/2015   PSA 0.00* 08/21/2014   HGBA1C 7.2* 07/28/2015   MICROALBUR 0.8 07/28/2015    No results found.  Assessment & Plan:   Issiah was seen today for diabetes, hypertension, hypothyroidism and hyperlipidemia.  Diagnoses and all orders for this visit:  Essential hypertension, benign- his blood pressure is not well controlled due to his recent use of decongestants, he agrees to discontinue the decongestants and will recheck his blood pressure during his next visit. His labs  today indicate no secondary causes for hypertension or end organ damage. -     Basic metabolic panel; Future -     CBC with Differential/Platelet; Future -     Urinalysis, Routine w reflex microscopic (not at [facility]); Future  Type 2 diabetes mellitus with complication, without long-term current use of insulin (HCC)- his A1c is up to 7.2%, this is higher than before, for now will continue the current medications and he agrees to work on his lifestyle modifications with diet/exercise/weight loss. -     Basic metabolic panel; Future -     Hemoglobin A1c; Future -     Microalbumin / creatinine urine ratio; Future  TSH elevation- his TSH is improved but is still slightly elevated, for now will observe him off of levothyroxine at his request. -     TSH; Future -     T4, free; Future -     T3; Future -     Cancel: Thyroid peroxidase antibody; Future  Allergic rhinitis due to pollen- I've asked him to discontinue using decongestants and will start Dymista nasal spray for symptom relief. -     Azelastine-Fluticasone (DYMISTA) 137-50 MCG/ACT SUSP;  Place 2 puffs into the nose 2 (two) times daily.  I am having Mr. Luhman start on Azelastine-Fluticasone. I am also having him maintain his Insulin Glargine, glucose blood, B-D ULTRAFINE III SHORT PEN, rosuvastatin, olmesartan, aspirin EC, levothyroxine, and Alogliptin-Metformin HCl.  Meds ordered this encounter  Medications  . Azelastine-Fluticasone (DYMISTA) 137-50 MCG/ACT SUSP    Sig: Place 2 puffs into the nose 2 (two) times daily.    Dispense:  23 g    Refill:  11     Follow-up: Return in about 4 months (around 11/28/2015).  [name], MD

## 2015-07-28 NOTE — Progress Notes (Signed)
Pre visit review using our clinic review tool, if applicable. No additional management support is needed unless otherwise documented below in the visit note. 

## 2015-07-29 ENCOUNTER — Encounter: Payer: Self-pay | Admitting: Internal Medicine

## 2015-07-29 LAB — T3: T3, Total: 87 ng/dL (ref 76–181)

## 2015-08-31 ENCOUNTER — Other Ambulatory Visit: Payer: Self-pay | Admitting: Internal Medicine

## 2015-09-23 ENCOUNTER — Other Ambulatory Visit: Payer: Self-pay | Admitting: Internal Medicine

## 2015-09-23 NOTE — Telephone Encounter (Signed)
Recent message to pt via mychart stated  Your thyroid level is still mildly abnormal, we will see how you do off of the thyroid medication over the next few months and I will recheck your level next time I see you.          Rx for levothyroxin refused.

## 2015-10-07 ENCOUNTER — Telehealth: Payer: Self-pay | Admitting: Emergency Medicine

## 2015-10-07 MED ORDER — INSULIN GLARGINE 300 UNIT/ML ~~LOC~~ SOPN: 20.0000 [IU] | PEN_INJECTOR | Freq: Every day | SUBCUTANEOUS | 5 refills | Status: DC

## 2015-10-07 NOTE — Telephone Encounter (Signed)
erx sent in

## 2015-10-07 NOTE — Telephone Encounter (Signed)
Pt wants to know if he can get a refill on Insulin Glargine (TOUJEO SOLOSTAR) 300 UNIT/ML SOPN. [facility]acy is [facility] [address]. Please follow up thanks.

## 2015-10-14 ENCOUNTER — Ambulatory Visit (INDEPENDENT_AMBULATORY_CARE_PROVIDER_SITE_OTHER): Payer: Medicare Other | Admitting: Internal Medicine

## 2015-10-14 ENCOUNTER — Other Ambulatory Visit (INDEPENDENT_AMBULATORY_CARE_PROVIDER_SITE_OTHER): Payer: Medicare Other

## 2015-10-14 ENCOUNTER — Encounter: Payer: Self-pay | Admitting: Internal Medicine

## 2015-10-14 VITALS — BP 112/70 | HR 98 | Temp 97.9°F | Resp 16 | Wt 204.0 lb

## 2015-10-14 DIAGNOSIS — R7989 Other specified abnormal findings of blood chemistry: Secondary | ICD-10-CM

## 2015-10-14 DIAGNOSIS — R946 Abnormal results of thyroid function studies: Secondary | ICD-10-CM | POA: Diagnosis not present

## 2015-10-14 DIAGNOSIS — E118 Type 2 diabetes mellitus with unspecified complications: Secondary | ICD-10-CM

## 2015-10-14 DIAGNOSIS — E039 Hypothyroidism, unspecified: Secondary | ICD-10-CM

## 2015-10-14 DIAGNOSIS — I10 Essential (primary) hypertension: Secondary | ICD-10-CM | POA: Diagnosis not present

## 2015-10-14 DIAGNOSIS — Z794 Long term (current) use of insulin: Secondary | ICD-10-CM

## 2015-10-14 DIAGNOSIS — E784 Other hyperlipidemia: Secondary | ICD-10-CM | POA: Diagnosis not present

## 2015-10-14 DIAGNOSIS — E785 Hyperlipidemia, unspecified: Secondary | ICD-10-CM

## 2015-10-14 LAB — BASIC METABOLIC PANEL
BUN: 12 mg/dL (ref 6–23)
CALCIUM: 9.8 mg/dL (ref 8.4–10.5)
CHLORIDE: 108 meq/L (ref 96–112)
CO2: 23 meq/L (ref 19–32)
CREATININE: 1.04 mg/dL (ref 0.40–1.50)
GFR: 76.17 mL/min (ref >60.00)
GLUCOSE: 124 mg/dL — AB (ref 70–99)
Potassium: 4.5 mEq/L (ref 3.5–5.1)
Sodium: 141 mEq/L (ref 135–145)

## 2015-10-14 LAB — LIPID PANEL
CHOL/HDL RATIO: 2
Cholesterol: 147 mg/dL (ref 0–200)
HDL: 61.7 mg/dL (ref >39.00)
LDL CALC: 65 mg/dL (ref 0–99)
NONHDL: 85.43
TRIGLYCERIDES: 102 mg/dL (ref 0.0–149.0)
VLDL: 20.4 mg/dL (ref 0.0–40.0)

## 2015-10-14 LAB — TSH: TSH: 3.07 u[IU]/mL (ref 0.35–4.50)

## 2015-10-14 LAB — HEMOGLOBIN A1C: Hgb A1c MFr Bld: 6.9 % — ABNORMAL HIGH (ref 4.6–6.5)

## 2015-10-14 NOTE — Patient Instructions (Signed)

## 2015-10-14 NOTE — Progress Notes (Signed)
Subjective:  Patient ID: Clifford Brown, male    DOB: 1950-08-05  Age: 65 y.o. MRN: [id number]  CC: Follow-up; Hypertension; Diabetes; and Hypothyroidism   HPI Clifford Brown presents for follow-up on the above medical problems. He feels well today and offers no complaints. He tells me his blood sugars have been well controlled. His blood pressure has been well controlled on an ARB. He feels better with the addition of levothyroxine in the sense that he has more energy but unfortunately he has not been able to lose weight.  Outpatient Medications Prior to Visit  Medication Sig Dispense Refill  . aspirin EC 81 MG tablet Take 1 tablet (81 mg total) by mouth daily. 90 tablet 3  . Azelastine-Fluticasone (DYMISTA) 137-50 MCG/ACT SUSP Place 2 puffs into the nose 2 (two) times daily. 23 g 11  . B-D ULTRAFINE III SHORT PEN 31G X 8 MM MISC USE DAILY WITH TOUJEO 100 each 3  . glucose blood (FREESTYLE LITE) test strip USE AS DIRECTED 3 TIMES A DAY 100 each 11  . Insulin Glargine (TOUJEO SOLOSTAR) 300 UNIT/ML SOPN Inject 20 Units into the skin daily. 1.5 mL 5  . olmesartan (BENICAR) 40 MG tablet Take 1 tablet (40 mg total) by mouth daily. 90 tablet 3  . rosuvastatin (CRESTOR) 10 MG tablet Take 1 tablet (10 mg total) by mouth daily. 90 tablet 3  . Alogliptin-Metformin HCl 12.5-500 MG TABS Take 1 tablet by mouth 2 (two) times daily. 180 tablet 1  . levothyroxine (SYNTHROID, LEVOTHROID) 50 MCG tablet Take 1 tablet (50 mcg total) by mouth daily. (Patient not taking: Reported on 10/14/2015) 90 tablet 1   No facility-administered medications prior to visit.     ROS Review of Systems  Constitutional: Negative.  Negative for activity change, appetite change, diaphoresis, fatigue and fever.  HENT: Negative.   Eyes: Negative.  Negative for visual disturbance.  Respiratory: Negative for cough, choking, chest tightness, shortness of breath and stridor.   Cardiovascular: Negative.  Negative for chest pain,  palpitations and leg swelling.  Gastrointestinal: Negative.  Negative for abdominal pain, constipation, diarrhea, nausea and vomiting.  Endocrine: Negative.  Negative for polydipsia, polyphagia and polyuria.  Genitourinary: Negative.   Musculoskeletal: Negative.  Negative for back pain, myalgias and neck pain.  Skin: Negative.   Allergic/Immunologic: Negative.   Neurological: Negative.  Negative for dizziness, weakness, numbness and headaches.  Hematological: Negative.  Negative for adenopathy. Does not bruise/bleed easily.  Psychiatric/Behavioral: Negative.     Objective:  BP 112/70   Pulse 98   Temp 97.9 F (36.6 C) (Oral)   Resp 16   Wt 204 lb (92.5 kg)   SpO2 96%   BMI 29.27 kg/m   BP Readings from Last 3 Encounters:  10/14/15 112/70  07/28/15 (!) 144/90  03/30/15 128/88    Wt Readings from Last 3 Encounters:  10/14/15 204 lb (92.5 kg)  07/28/15 205 lb (93 kg)  03/30/15 208 lb (94.3 kg)    Physical Exam  Constitutional: He is oriented to person, place, and time. No distress.  HENT:  Mouth/Throat: Oropharynx is clear and moist. No oropharyngeal exudate.  Eyes: Conjunctivae are normal. Right eye exhibits no discharge. Left eye exhibits no discharge. No scleral icterus.  Neck: Normal range of motion. Neck supple. No JVD present. No tracheal deviation present. No thyromegaly present.  Cardiovascular: Normal rate, regular rhythm, normal heart sounds and intact distal pulses.  Exam reveals no gallop and no friction rub.   No murmur heard.  Pulmonary/Chest: Effort normal and breath sounds normal. No stridor. No respiratory distress. He has no wheezes. He has no rales. He exhibits no tenderness.  Abdominal: Soft. Bowel sounds are normal. He exhibits no distension and no mass. There is no tenderness. There is no rebound and no guarding.  Musculoskeletal: Normal range of motion. He exhibits no edema, tenderness or deformity.  Lymphadenopathy:    He has no cervical adenopathy.    Neurological: He is oriented to person, place, and time.  Skin: Skin is warm and dry. No rash noted. He is not diaphoretic. No erythema. No pallor.  Vitals reviewed.   Lab Results  Component Value Date   WBC 6.0 07/28/2015   HGB 15.7 07/28/2015   HCT 45.3 07/28/2015   PLT 188.0 07/28/2015   GLUCOSE 124 (H) 10/14/2015   CHOL 147 10/14/2015   TRIG 102.0 10/14/2015   HDL 61.70 10/14/2015   LDLDIRECT 183.6 01/31/2011   LDLCALC 65 10/14/2015   ALT 24 08/21/2014   AST 16 08/21/2014   NA 141 10/14/2015   K 4.5 10/14/2015   CL 108 10/14/2015   CREATININE 1.04 10/14/2015   BUN 12 10/14/2015   CO2 23 10/14/2015   TSH 3.07 10/14/2015   PSA 0.00 (L) 08/21/2014   HGBA1C 6.9 (H) 10/14/2015   MICROALBUR 0.8 07/28/2015    No results found.  Assessment & Plan:   Clifford Brown was seen today for follow-up, hypertension, diabetes and hypothyroidism.  Diagnoses and all orders for this visit:  Essential hypertension, benign- his blood pressure is well-controlled, electrolytes and renal function are stable. -     Basic metabolic panel; Future  TSH elevation -     Cancel: Thyroid Panel With TSH; Future  Dyslipidemia (high LDL; low HDL)- he is achieved his LDL goal is doing well on the statin. -     Lipid panel; Future  Hypothyroidism, unspecified hypothyroidism type- his TSH is in the normal range, he will remain on the current dose of levothyroxine. -     TSH; Future  Type 2 diabetes mellitus with complication, with long-term current use of insulin (Clifford Brown)- his A1c is down to 6.9%, will continue basal insulin as well as metformin with the DPP 4 inhibitor. -     Hemoglobin A1c; Future   I have discontinued Clifford Brown's levothyroxine. I am also having him maintain his glucose blood, rosuvastatin, olmesartan, aspirin EC, Azelastine-Fluticasone, B-D ULTRAFINE III SHORT PEN, and Insulin Glargine.  No orders of the defined types were placed in this encounter.    Follow-up: Return in about 6  months (around 04/15/2016).  Clifford Calico, MD

## 2015-10-15 ENCOUNTER — Encounter: Payer: Self-pay | Admitting: Internal Medicine

## 2015-10-15 ENCOUNTER — Other Ambulatory Visit: Payer: Self-pay | Admitting: Internal Medicine

## 2015-10-15 DIAGNOSIS — E118 Type 2 diabetes mellitus with unspecified complications: Secondary | ICD-10-CM

## 2015-10-20 ENCOUNTER — Other Ambulatory Visit: Payer: Self-pay | Admitting: Internal Medicine

## 2015-10-20 ENCOUNTER — Telehealth: Payer: Self-pay | Admitting: *Deleted

## 2015-10-20 DIAGNOSIS — E118 Type 2 diabetes mellitus with unspecified complications: Secondary | ICD-10-CM

## 2015-10-20 MED ORDER — METFORMIN HCL ER 750 MG PO TB24: 1500.0000 mg | ORAL_TABLET | Freq: Every day | ORAL | 1 refills | Status: DC

## 2015-10-20 NOTE — Telephone Encounter (Signed)
RX changed as requested

## 2015-10-20 NOTE — Telephone Encounter (Signed)
Notified pt w/MD response.../lmb 

## 2015-10-20 NOTE — Telephone Encounter (Signed)
Rec'd call pt states his insurance change as of July 1st. They will not cover the generic Alogliptin-Metformin it has to be the brand name Kazano. Pt is wanting to know why he can not take the Glucophage ER he states he can get that for $4. Pls advise...Clifford Brown

## 2015-11-18 DIAGNOSIS — C61 Malignant neoplasm of prostate: Secondary | ICD-10-CM | POA: Diagnosis not present

## 2015-11-24 DIAGNOSIS — N5201 Erectile dysfunction due to arterial insufficiency: Secondary | ICD-10-CM | POA: Diagnosis not present

## 2015-11-24 DIAGNOSIS — Z8546 Personal history of malignant neoplasm of prostate: Secondary | ICD-10-CM | POA: Diagnosis not present

## 2015-12-30 DIAGNOSIS — E119 Type 2 diabetes mellitus without complications: Secondary | ICD-10-CM | POA: Diagnosis not present

## 2016-01-03 ENCOUNTER — Other Ambulatory Visit: Payer: Self-pay | Admitting: Internal Medicine

## 2016-01-12 ENCOUNTER — Other Ambulatory Visit: Payer: Self-pay | Admitting: Internal Medicine

## 2016-01-27 LAB — HM DIABETES EYE EXAM

## 2016-03-30 ENCOUNTER — Other Ambulatory Visit: Payer: Self-pay | Admitting: Internal Medicine

## 2016-03-30 DIAGNOSIS — I10 Essential (primary) hypertension: Secondary | ICD-10-CM

## 2016-03-30 DIAGNOSIS — E118 Type 2 diabetes mellitus with unspecified complications: Secondary | ICD-10-CM

## 2016-04-21 ENCOUNTER — Other Ambulatory Visit: Payer: Self-pay | Admitting: Internal Medicine

## 2016-04-21 DIAGNOSIS — E118 Type 2 diabetes mellitus with unspecified complications: Secondary | ICD-10-CM

## 2016-04-28 ENCOUNTER — Other Ambulatory Visit: Payer: Self-pay | Admitting: Internal Medicine

## 2016-04-28 DIAGNOSIS — I10 Essential (primary) hypertension: Secondary | ICD-10-CM

## 2016-04-28 DIAGNOSIS — E118 Type 2 diabetes mellitus with unspecified complications: Secondary | ICD-10-CM

## 2016-05-02 ENCOUNTER — Ambulatory Visit (INDEPENDENT_AMBULATORY_CARE_PROVIDER_SITE_OTHER): Payer: Medicare Other | Admitting: Internal Medicine

## 2016-05-02 ENCOUNTER — Encounter: Payer: Self-pay | Admitting: Internal Medicine

## 2016-05-02 ENCOUNTER — Other Ambulatory Visit (INDEPENDENT_AMBULATORY_CARE_PROVIDER_SITE_OTHER): Payer: Medicare Other

## 2016-05-02 VITALS — BP 122/80 | HR 88 | Temp 98.0°F | Resp 16 | Ht 70.0 in | Wt 203.5 lb

## 2016-05-02 DIAGNOSIS — E039 Hypothyroidism, unspecified: Secondary | ICD-10-CM | POA: Diagnosis not present

## 2016-05-02 DIAGNOSIS — B3742 Candidal balanitis: Secondary | ICD-10-CM

## 2016-05-02 DIAGNOSIS — E784 Other hyperlipidemia: Secondary | ICD-10-CM

## 2016-05-02 DIAGNOSIS — Z0000 Encounter for general adult medical examination without abnormal findings: Secondary | ICD-10-CM | POA: Diagnosis not present

## 2016-05-02 DIAGNOSIS — Z8546 Personal history of malignant neoplasm of prostate: Secondary | ICD-10-CM | POA: Diagnosis not present

## 2016-05-02 DIAGNOSIS — E785 Hyperlipidemia, unspecified: Secondary | ICD-10-CM

## 2016-05-02 DIAGNOSIS — E118 Type 2 diabetes mellitus with unspecified complications: Secondary | ICD-10-CM | POA: Diagnosis not present

## 2016-05-02 DIAGNOSIS — I10 Essential (primary) hypertension: Secondary | ICD-10-CM

## 2016-05-02 LAB — COMPREHENSIVE METABOLIC PANEL
ALBUMIN: 4.2 g/dL (ref 3.5–5.2)
ALT: 25 U/L (ref 0–53)
AST: 18 U/L (ref 0–37)
Alkaline Phosphatase: 78 U/L (ref 39–117)
BILIRUBIN TOTAL: 0.9 mg/dL (ref 0.2–1.2)
BUN: 13 mg/dL (ref 6–23)
CALCIUM: 9.5 mg/dL (ref 8.4–10.5)
CHLORIDE: 103 meq/L (ref 96–112)
CO2: 26 mEq/L (ref 19–32)
CREATININE: 1.02 mg/dL (ref 0.40–1.50)
GFR: 77.77 mL/min (ref >60.00)
Glucose, Bld: 196 mg/dL — ABNORMAL HIGH (ref 70–99)
Potassium: 4.4 mEq/L (ref 3.5–5.1)
SODIUM: 138 meq/L (ref 135–145)
TOTAL PROTEIN: 6.6 g/dL (ref 6.0–8.3)

## 2016-05-02 LAB — CBC WITH DIFFERENTIAL/PLATELET
BASOS ABS: 0 10*3/uL (ref 0.0–0.1)
BASOS PCT: 1 % (ref 0.0–3.0)
EOS ABS: 0.1 10*3/uL (ref 0.0–0.7)
Eosinophils Relative: 2.1 % (ref 0.0–5.0)
HEMATOCRIT: 45.9 % (ref 39.0–52.0)
HEMOGLOBIN: 16 g/dL (ref 13.0–17.0)
LYMPHS PCT: 25.3 % (ref 12.0–46.0)
Lymphs Abs: 1.1 10*3/uL (ref 0.7–4.0)
MCHC: 34.8 g/dL (ref 30.0–36.0)
MCV: 89.3 fl (ref 78.0–100.0)
MONO ABS: 0.3 10*3/uL (ref 0.1–1.0)
Monocytes Relative: 7.6 % (ref 3.0–12.0)
Neutro Abs: 2.9 10*3/uL (ref 1.4–7.7)
Neutrophils Relative %: 64 % (ref 43.0–77.0)
Platelets: 175 10*3/uL (ref 150.0–400.0)
RBC: 5.15 Mil/uL (ref 4.22–5.81)
RDW: 12.9 % (ref 11.5–15.5)
WBC: 4.5 10*3/uL (ref 4.0–10.5)

## 2016-05-02 LAB — URINALYSIS, ROUTINE W REFLEX MICROSCOPIC
Bilirubin Urine: NEGATIVE
Hgb urine dipstick: NEGATIVE
KETONES UR: NEGATIVE
Leukocytes, UA: NEGATIVE
NITRITE: NEGATIVE
PH: 5.5 (ref 5.0–8.0)
RBC / HPF: NONE SEEN (ref 0–?)
SPECIFIC GRAVITY, URINE: 1.02 (ref 1.000–1.030)
Urine Glucose: >=1000 — AB
Urobilinogen, UA: 0.2 (ref 0.0–1.0)
WBC UA: NONE SEEN (ref 0–?)

## 2016-05-02 LAB — LIPID PANEL
CHOLESTEROL: 164 mg/dL (ref 0–200)
HDL: 64.9 mg/dL (ref >39.00)
LDL CALC: 83 mg/dL (ref 0–99)
NonHDL: 99.42
TRIGLYCERIDES: 81 mg/dL (ref 0.0–149.0)
Total CHOL/HDL Ratio: 3
VLDL: 16.2 mg/dL (ref 0.0–40.0)

## 2016-05-02 LAB — MICROALBUMIN / CREATININE URINE RATIO
CREATININE, U: 113.1 mg/dL
MICROALB UR: 3.2 mg/dL — AB (ref 0.0–1.9)
Microalb Creat Ratio: 2.8 mg/g (ref 0.0–30.0)

## 2016-05-02 LAB — PSA: PSA: 0 ng/mL — ABNORMAL LOW (ref 0.10–4.00)

## 2016-05-02 LAB — HEMOGLOBIN A1C: HEMOGLOBIN A1C: 9.8 % — AB (ref 4.6–6.5)

## 2016-05-02 LAB — TSH: TSH: 4.88 u[IU]/mL — ABNORMAL HIGH (ref 0.35–4.50)

## 2016-05-02 MED ORDER — ITRACONAZOLE 200 MG PO TABS: 1.0000 | ORAL_TABLET | Freq: Two times a day (BID) | ORAL | 1 refills | Status: DC

## 2016-05-02 MED ORDER — INSULIN GLARGINE 300 UNIT/ML ~~LOC~~ SOPN: 50.0000 [IU] | PEN_INJECTOR | Freq: Every day | SUBCUTANEOUS | 5 refills | Status: DC

## 2016-05-02 MED ORDER — EXENATIDE ER 2 MG/0.85ML ~~LOC~~ AUIJ: 1.0000 | AUTO-INJECTOR | SUBCUTANEOUS | 1 refills | Status: DC

## 2016-05-02 NOTE — Patient Instructions (Signed)

## 2016-05-02 NOTE — Assessment & Plan Note (Signed)
He refused a flu vax today  The written screening recommendations is given to patient and attached in the patent instructions or AVS.   The patient is here for annual Medicare wellness examination and management of other chronic and acute problems.   The risk factors are reflected in the social history.  The roster of all physicians providing medical care to patient - is listed in the Snapshot section of the chart.  Activities of daily living:  The patient is 100% inedpendent in all ADLs: dressing, toileting, feeding as well as independent mobility  Home safety : The patient has smoke detectors in the home. They wear seatbelts.No firearms at home ( firearms are present in the home, kept in a safe fashion). There is no violence in the home.   There is no risks for hepatitis, STDs or HIV. There is no   history of blood transfusion. They have no travel history to infectious disease endemic areas of the world.  The patient has (has not) seen their dentist in the last six month. They have (not) seen their eye doctor in the last year. They deny (admit to) any hearing difficulty and have not had audiologic testing in the last year.  They do not  have excessive sun exposure. Discussed the need for sun protection: hats, long sleeves and use of sunscreen if there is significant sun exposure.   Diet: the importance of a healthy diet is discussed. They do have a healthy (unhealthy-high fat/fast food) diet.  The patient has a regular exercise program.  The benefits of regular aerobic exercise were discussed.  Depression screen: there are no signs or vegative symptoms of depression- irritability, change in appetite, anhedonia, sadness/tearfullness.  Cognitive assessment: the patient manages all their financial and personal affairs and is actively engaged. They could relate day,date,year and events; recalled 3/3 objects at 3 minutes; performed clock-face test normally.  The following portions of the  patient's history were reviewed and updated as appropriate: allergies, current medications, past family history, past medical history,  past surgical history, past social history  and problem list.  Vision, hearing, body mass index were assessed and reviewed.   During the course of the visit the patient was educated and counseled about appropriate screening and preventive services including : fall prevention , diabetes screening, nutrition counseling, colorectal cancer screening, and recommended immunizations.  

## 2016-05-02 NOTE — Progress Notes (Signed)
Subjective:  Patient ID: Clifford Brown, male    DOB: 1950-12-02  Age: 66 y.o. MRN: [id number]  CC: Annual Exam; Hypertension; Hyperlipidemia; and Diabetes   HPI Clifford Brown presents for an AWV/CPX.  He complains of frequent urination and is concerned about redness under his foreskin. He does not monitor his blood sugar. He denies visual disturbance, polydipsia or polyphagia. He tells me his blood pressure has been well controlled and he has had no episodes of headache/blurred vision/chest pain/shortness of breath/palpitations/edema/fatigue.  Past Medical History:  Diagnosis Date  . Cancer [facility])    Prostate - Dr Alinda Money   . Depression   . Diabetes mellitus without complication ([place])   . Hyperlipidemia   . Hypertension    Past Surgical History:  Procedure Laterality Date  . PROSTATE SURGERY      reports that he has never smoked. He has never used smokeless tobacco. He reports that he drinks about 12.6 oz of alcohol per week . He reports that he does not use drugs. family history includes Alcohol abuse in his other; Cancer in his other. No Known Allergies   Outpatient Medications Prior to Visit  Medication Sig Dispense Refill  . aspirin EC 81 MG tablet Take 1 tablet (81 mg total) by mouth daily. 90 tablet 3  . Azelastine-Fluticasone (DYMISTA) 137-50 MCG/ACT SUSP Place 2 puffs into the nose 2 (two) times daily. 23 g 11  . B-D ULTRAFINE III SHORT PEN 31G X 8 MM MISC USE DAILY WITH TOUJEO 100 each 3  . FREESTYLE LITE test strip USE AS DIRECTED 3 TIMES A DAY 100 each 2  . metFORMIN (GLUCOPHAGE-XR) 750 MG 24 hr tablet TAKE 2 TABLETS (1,500 MG TOTAL) BY MOUTH DAILY WITH BREAKFAST. 180 tablet 1  . olmesartan (BENICAR) 40 MG tablet TAKE 1 TABLET BY MOUTH EVERY DAY 90 tablet 0  . rosuvastatin (CRESTOR) 10 MG tablet TAKE 1 TABLET (10 MG TOTAL) BY MOUTH DAILY. 90 tablet 3  . Insulin Glargine (TOUJEO SOLOSTAR) 300 UNIT/ML SOPN Inject 20 Units into the skin daily. 1.5 mL 5   No  facility-administered medications prior to visit.     ROS Review of Systems  Constitutional: Negative for activity change, appetite change, diaphoresis, fatigue and unexpected weight change.  HENT: Negative.   Eyes: Negative for visual disturbance.  Respiratory: Negative for cough, chest tightness, shortness of breath and wheezing.   Gastrointestinal: Negative for abdominal pain, blood in stool, constipation, diarrhea, nausea and vomiting.  Endocrine: Positive for polyuria. Negative for cold intolerance, heat intolerance, polydipsia and polyphagia.  Genitourinary: Positive for penile pain. Negative for difficulty urinating, dysuria, frequency, penile swelling, scrotal swelling, testicular pain and urgency.  Musculoskeletal: Negative.  Negative for back pain, myalgias and neck pain.  Skin: Negative.  Negative for color change and rash.  Allergic/Immunologic: Negative.   Neurological: Negative.  Negative for dizziness, weakness and light-headedness.  Hematological: Negative for adenopathy. Does not bruise/bleed easily.  Psychiatric/Behavioral: Negative.     Objective:  BP 122/80 (BP Location: Left Arm, Patient Position: Sitting, Cuff Size: Normal)   Pulse 88   Temp 98 F (36.7 C) (Oral)   Resp 16   Ht 5\' 10"  (1.778 m)   Wt 203 lb 8 oz (92.3 kg)   SpO2 97%   BMI 29.20 kg/m   BP Readings from Last 3 Encounters:  05/02/16 122/80  10/14/15 112/70  07/28/15 (!) 144/90    Wt Readings from Last 3 Encounters:  05/02/16 203 lb 8 oz (92.3 kg)  10/14/15 204 lb (92.5 kg)  07/28/15 205 lb (93 kg)    Physical Exam  Constitutional: He is oriented to person, place, and time. No distress.  HENT:  Mouth/Throat: Oropharynx is clear and moist. No oropharyngeal exudate.  Eyes: Conjunctivae are normal. Right eye exhibits no discharge. Left eye exhibits no discharge. No scleral icterus.  Neck: Normal range of motion. Neck supple. No JVD present. No tracheal deviation present. No thyromegaly  present.  Cardiovascular: Normal rate, regular rhythm, normal heart sounds and intact distal pulses.  Exam reveals no gallop and no friction rub.   No murmur heard. Pulmonary/Chest: Effort normal and breath sounds normal. No stridor. No respiratory distress. He has no wheezes. He has no rales. He exhibits no tenderness.  Abdominal: Soft. Bowel sounds are normal. He exhibits no distension and no mass. There is no tenderness. There is no rebound and no guarding. Hernia confirmed negative in the right inguinal area and confirmed negative in the left inguinal area.  Genitourinary: Rectum normal, prostate normal and testes normal. Rectal exam shows no external hemorrhoid, no internal hemorrhoid, no fissure, no mass, no tenderness, anal tone normal and guaiac negative stool. Prostate is not enlarged and not tender. Right testis shows no mass, no swelling and no tenderness. Right testis is descended. Left testis shows no mass, no swelling and no tenderness. Left testis is descended. Uncircumcised. Penile erythema present. No phimosis, paraphimosis, hypospadias or penile tenderness. No discharge found.  Genitourinary Comments: Glans and foreskin are slightly erythematous with a scant exudate  Musculoskeletal: Normal range of motion. He exhibits no edema, tenderness or deformity.  Lymphadenopathy:    He has no cervical adenopathy.       Right: No inguinal adenopathy present.       Left: No inguinal adenopathy present.  Neurological: He is oriented to person, place, and time.  Skin: Skin is warm and dry. No rash noted. He is not diaphoretic. No erythema. No pallor.  Vitals reviewed.   Lab Results  Component Value Date   WBC 4.5 05/02/2016   HGB 16.0 05/02/2016   HCT 45.9 05/02/2016   PLT 175.0 05/02/2016   GLUCOSE 196 (H) 05/02/2016   CHOL 164 05/02/2016   TRIG 81.0 05/02/2016   HDL 64.90 05/02/2016   LDLDIRECT 183.6 01/31/2011   LDLCALC 83 05/02/2016   ALT 25 05/02/2016   AST 18 05/02/2016   NA  138 05/02/2016   K 4.4 05/02/2016   CL 103 05/02/2016   CREATININE 1.02 05/02/2016   BUN 13 05/02/2016   CO2 26 05/02/2016   TSH 4.88 (H) 05/02/2016   PSA 0.00 (L) 05/02/2016   HGBA1C 9.8 (H) 05/02/2016   MICROALBUR 3.2 (H) 05/02/2016    No results found.  Assessment & Plan:   Jahfari was seen today for annual exam, hypertension, hyperlipidemia and diabetes.  Diagnoses and all orders for this visit:  Essential hypertension, benign- His BP is well controlled, lytes and renal function are normal -     Comprehensive metabolic panel; Future -     CBC with Differential/Platelet; Future -     Urinalysis, Routine w reflex microscopic; Future  Acquired hypothyroidism- his TSH is slightly elevated but he is asx so will not prescribe T4 at this time -     TSH; Future  Type 2 diabetes mellitus with complication, without long-term current use of insulin ([place])- his A1C is up to 9.8% so I have asked him to restart basal insulin and cont metformin, will also add on  a GLP-1 agonist -     Comprehensive metabolic panel; Future -     Hemoglobin A1c; Future -     Insulin Glargine (TOUJEO SOLOSTAR) 300 UNIT/ML SOPN; Inject 50 Units into the skin daily. -     Exenatide ER (BYDUREON BCISE) 2 MG/0.85ML AUIJ; Inject 1 Act into the skin once a week.  PROSTATE CANCER, HX OF- his PSA remains undetectable, no evidence of recurrence -     PSA; Future  Dyslipidemia (high LDL; low HDL)- he has achieved his LDL goal and is doing well on the statin -     Lipid panel; Future -     Microalbumin / creatinine urine ratio; Future  Candidal balanitis -     Itraconazole 200 MG TABS; Take 1 tablet by mouth 2 (two) times daily.  Routine general medical examination at a health care facility   I have changed Mr. Gersten's Insulin Glargine. I am also having him start on Itraconazole and Exenatide ER. Additionally, I am having him maintain his aspirin EC, Azelastine-Fluticasone, B-D ULTRAFINE III SHORT PEN,  rosuvastatin, FREESTYLE LITE, metFORMIN, and olmesartan.  Meds ordered this encounter  Medications  . Itraconazole 200 MG TABS    Sig: Take 1 tablet by mouth 2 (two) times daily.    Dispense:  2 tablet    Refill:  1  . Insulin Glargine (TOUJEO SOLOSTAR) 300 UNIT/ML SOPN    Sig: Inject 50 Units into the skin daily.    Dispense:  1.5 mL    Refill:  5  . Exenatide ER (BYDUREON BCISE) 2 MG/0.85ML AUIJ    Sig: Inject 1 Act into the skin once a week.    Dispense:  12 pen    Refill:  1   See AVS for instructions about healthy living and anticipatory guidance.  Follow-up: Return in about 4 months (around 08/30/2016).  [name], MD

## 2016-05-02 NOTE — Progress Notes (Signed)
Pre visit review using our clinic review tool, if applicable. No additional management support is needed unless otherwise documented below in the visit note. 

## 2016-05-03 NOTE — Telephone Encounter (Signed)
Pt called in and has a few questions about this response on mychart.  He would like a nurse to give him a call back.

## 2016-07-04 ENCOUNTER — Encounter: Payer: Self-pay | Admitting: Internal Medicine

## 2016-07-04 ENCOUNTER — Other Ambulatory Visit (INDEPENDENT_AMBULATORY_CARE_PROVIDER_SITE_OTHER): Payer: Medicare Other

## 2016-07-04 ENCOUNTER — Ambulatory Visit (INDEPENDENT_AMBULATORY_CARE_PROVIDER_SITE_OTHER): Payer: Medicare Other | Admitting: Internal Medicine

## 2016-07-04 VITALS — BP 128/88 | HR 85 | Temp 98.6°F | Ht 70.0 in | Wt 207.5 lb

## 2016-07-04 DIAGNOSIS — E039 Hypothyroidism, unspecified: Secondary | ICD-10-CM | POA: Diagnosis not present

## 2016-07-04 DIAGNOSIS — I10 Essential (primary) hypertension: Secondary | ICD-10-CM

## 2016-07-04 DIAGNOSIS — E118 Type 2 diabetes mellitus with unspecified complications: Secondary | ICD-10-CM

## 2016-07-04 LAB — BASIC METABOLIC PANEL
BUN: 14 mg/dL (ref 6–23)
CHLORIDE: 105 meq/L (ref 96–112)
CO2: 27 meq/L (ref 19–32)
Calcium: 9.9 mg/dL (ref 8.4–10.5)
Creatinine, Ser: 1.07 mg/dL (ref 0.40–1.50)
GFR: 73.55 mL/min (ref >60.00)
GLUCOSE: 85 mg/dL (ref 70–99)
POTASSIUM: 4.6 meq/L (ref 3.5–5.1)
SODIUM: 140 meq/L (ref 135–145)

## 2016-07-04 LAB — HEMOGLOBIN A1C: Hgb A1c MFr Bld: 7.6 % — ABNORMAL HIGH (ref 4.6–6.5)

## 2016-07-04 LAB — TSH: TSH: 6.8 u[IU]/mL — AB (ref 0.35–4.50)

## 2016-07-04 MED ORDER — INSULIN GLARGINE 300 UNIT/ML ~~LOC~~ SOPN: 40.0000 [IU] | PEN_INJECTOR | Freq: Every day | SUBCUTANEOUS | 5 refills | Status: DC

## 2016-07-04 NOTE — Progress Notes (Signed)
Subjective:  Patient ID: Clifford Brown, male    DOB: [birth year]  Age: 66 y.o. MRN: [id number]  CC: Hypertension; Hypothyroidism; and Diabetes   HPI Clifford Brown presents for f/up - He complains that about twice a week he has low blood sugars in the morning before he eats, as low as 47-48. He is relatively asymptomatic with these episodes of hypoglycemia and eats something and says the blood sugar quickly normalizes to the 85-100 range. He otherwise feels well and offers no other complaints.  Outpatient Medications Prior to Visit  Medication Sig Dispense Refill  . aspirin EC 81 MG tablet Take 1 tablet (81 mg total) by mouth daily. 90 tablet 3  . Azelastine-Fluticasone (DYMISTA) 137-50 MCG/ACT SUSP Place 2 puffs into the nose 2 (two) times daily. 23 g 11  . B-D ULTRAFINE III SHORT PEN 31G X 8 MM MISC USE DAILY WITH TOUJEO 100 each 3  . Exenatide ER (BYDUREON BCISE) 2 MG/0.85ML AUIJ Inject 1 Act into the skin once a week. 12 pen 1  . FREESTYLE LITE test strip USE AS DIRECTED 3 TIMES A DAY 100 each 2  . metFORMIN (GLUCOPHAGE-XR) 750 MG 24 hr tablet TAKE 2 TABLETS (1,500 MG TOTAL) BY MOUTH DAILY WITH BREAKFAST. 180 tablet 1  . olmesartan (BENICAR) 40 MG tablet TAKE 1 TABLET BY MOUTH EVERY DAY 90 tablet 0  . rosuvastatin (CRESTOR) 10 MG tablet TAKE 1 TABLET (10 MG TOTAL) BY MOUTH DAILY. 90 tablet 3  . Insulin Glargine (TOUJEO SOLOSTAR) 300 UNIT/ML SOPN Inject 50 Units into the skin daily. 1.5 mL 5  . Itraconazole 200 MG TABS Take 1 tablet by mouth 2 (two) times daily. 2 tablet 1   No facility-administered medications prior to visit.     ROS Review of Systems  Constitutional: Negative for appetite change, chills, diaphoresis, fatigue and unexpected weight change.  HENT: Negative.   Eyes: Negative for visual disturbance.  Respiratory: Negative for cough, chest tightness, shortness of breath and wheezing.   Cardiovascular: Negative for chest pain, palpitations and leg swelling.    Gastrointestinal: Negative for abdominal pain, constipation, diarrhea, nausea and vomiting.  Endocrine: Negative.  Negative for cold intolerance, heat intolerance, polydipsia, polyphagia and polyuria.  Genitourinary: Negative.   Musculoskeletal: Negative.  Negative for arthralgias, back pain and neck pain.  Skin: Negative.   Allergic/Immunologic: Negative.   Neurological: Negative.  Negative for dizziness.  Hematological: Negative for adenopathy. Does not bruise/bleed easily.  Psychiatric/Behavioral: Negative.     Objective:  BP 128/88 (BP Location: Left Arm, Patient Position: Sitting, Cuff Size: Normal)   Pulse 85   Temp 98.6 F (37 C) (Oral)   Ht 5\' 10"  (1.778 m)   Wt 207 lb 8 oz (94.1 kg)   SpO2 99%   BMI 29.77 kg/m   BP Readings from Last 3 Encounters:  07/04/16 128/88  05/02/16 122/80  10/14/15 112/70    Wt Readings from Last 3 Encounters:  07/04/16 207 lb 8 oz (94.1 kg)  05/02/16 203 lb 8 oz (92.3 kg)  10/14/15 204 lb (92.5 kg)    Physical Exam  Constitutional: He is oriented to person, place, and time. No distress.  HENT:  Mouth/Throat: Oropharynx is clear and moist. No oropharyngeal exudate.  Eyes: Conjunctivae are normal. Right eye exhibits no discharge. Left eye exhibits no discharge. No scleral icterus.  Neck: Normal range of motion. Neck supple. No JVD present. No tracheal deviation present. No thyromegaly present.  Cardiovascular: Normal rate, regular rhythm, normal heart sounds and  intact distal pulses.  Exam reveals no gallop and no friction rub.   No murmur heard. Pulmonary/Chest: Effort normal and breath sounds normal. No stridor. No respiratory distress. He has no wheezes. He has no rales. He exhibits no tenderness.  Abdominal: Soft. Bowel sounds are normal. He exhibits no distension and no mass. There is no tenderness. There is no rebound and no guarding.  Musculoskeletal: Normal range of motion. He exhibits no edema, tenderness or deformity.   Lymphadenopathy:    He has no cervical adenopathy.  Neurological: He is oriented to person, place, and time.  Skin: Skin is warm and dry. No rash noted. He is not diaphoretic. No erythema. No pallor.  Vitals reviewed.   Lab Results  Component Value Date   WBC 4.5 05/02/2016   HGB 16.0 05/02/2016   HCT 45.9 05/02/2016   PLT 175.0 05/02/2016   GLUCOSE 85 07/04/2016   CHOL 164 05/02/2016   TRIG 81.0 05/02/2016   HDL 64.90 05/02/2016   LDLDIRECT 183.6 01/31/2011   LDLCALC 83 05/02/2016   ALT 25 05/02/2016   AST 18 05/02/2016   NA 140 07/04/2016   K 4.6 07/04/2016   CL 105 07/04/2016   CREATININE 1.07 07/04/2016   BUN 14 07/04/2016   CO2 27 07/04/2016   TSH 6.80 (H) 07/04/2016   PSA 0.00 (L) 05/02/2016   HGBA1C 7.6 (H) 07/04/2016   MICROALBUR 3.2 (H) 05/02/2016    No results found.  Assessment & Plan:   Clifford Brown was seen today for hypertension, hypothyroidism and diabetes.  Diagnoses and all orders for this visit:  Acquired hypothyroidism- his TSH is only mildly elevated and he is asymptomatic with respect to this so at this time I do not recommend that he start thyroid replacement therapy. -     TSH; Future  Type 2 diabetes mellitus with complication, without long-term current use of insulin ([place])- his A1c is down to 7.6% but since he is complaining of early morning hypoglycemia I have asked him to decrease the Toujeo dose from 50 units a day to 40 units a day. -     Basic metabolic panel; Future -     Hemoglobin A1c; Future -     Insulin Glargine (TOUJEO SOLOSTAR) 300 UNIT/ML SOPN; Inject 40 Units into the skin daily.  Essential hypertension, benign- his blood pressure is adequately well-controlled, electrolytes and renal function are normal. -     Basic metabolic panel; Future   I have discontinued Clifford Brown's Itraconazole. I have also changed his Insulin Glargine. Additionally, I am having him maintain his aspirin EC, Azelastine-Fluticasone, B-D ULTRAFINE III  SHORT PEN, rosuvastatin, FREESTYLE LITE, metFORMIN, olmesartan, and Exenatide ER.  Meds ordered this encounter  Medications  . Insulin Glargine (TOUJEO SOLOSTAR) 300 UNIT/ML SOPN    Sig: Inject 40 Units into the skin daily.    Dispense:  1.5 mL    Refill:  5     Follow-up: Return in about 4 months (around 11/03/2016).  [name], MD

## 2016-07-04 NOTE — Patient Instructions (Signed)

## 2016-07-04 NOTE — Progress Notes (Signed)
Pre visit review using our clinic review tool, if applicable. No additional management support is needed unless otherwise documented below in the visit note. 

## 2016-07-20 DIAGNOSIS — E119 Type 2 diabetes mellitus without complications: Secondary | ICD-10-CM | POA: Diagnosis not present

## 2016-07-20 LAB — HM DIABETES EYE EXAM

## 2016-07-27 ENCOUNTER — Other Ambulatory Visit: Payer: Self-pay | Admitting: Internal Medicine

## 2016-07-27 DIAGNOSIS — I10 Essential (primary) hypertension: Secondary | ICD-10-CM

## 2016-07-27 DIAGNOSIS — E118 Type 2 diabetes mellitus with unspecified complications: Secondary | ICD-10-CM

## 2016-08-02 ENCOUNTER — Encounter: Payer: Self-pay | Admitting: Internal Medicine

## 2016-09-26 ENCOUNTER — Ambulatory Visit (INDEPENDENT_AMBULATORY_CARE_PROVIDER_SITE_OTHER): Payer: Medicare Other | Admitting: Internal Medicine

## 2016-09-26 ENCOUNTER — Encounter: Payer: Self-pay | Admitting: Internal Medicine

## 2016-09-26 ENCOUNTER — Other Ambulatory Visit (INDEPENDENT_AMBULATORY_CARE_PROVIDER_SITE_OTHER): Payer: Medicare Other

## 2016-09-26 VITALS — BP 124/80 | HR 89 | Temp 98.4°F | Resp 16 | Ht 70.0 in | Wt 205.0 lb

## 2016-09-26 DIAGNOSIS — E039 Hypothyroidism, unspecified: Secondary | ICD-10-CM

## 2016-09-26 DIAGNOSIS — E118 Type 2 diabetes mellitus with unspecified complications: Secondary | ICD-10-CM

## 2016-09-26 LAB — THYROID PANEL WITH TSH
Free Thyroxine Index: 1.7 (ref 1.4–3.8)
T3 UPTAKE: 36 % — AB (ref 22–35)
T4 TOTAL: 4.8 ug/dL (ref 4.5–12.0)
TSH: 5.23 m[IU]/L — AB (ref 0.40–4.50)

## 2016-09-26 LAB — HEMOGLOBIN A1C: Hgb A1c MFr Bld: 7.1 % — ABNORMAL HIGH (ref 4.6–6.5)

## 2016-09-26 NOTE — Progress Notes (Signed)
Subjective:  Patient ID: Clifford Brown, male    DOB: 01-01-1951  Age: 66 y.o. MRN: [id number]  CC: Hypothyroidism and Diabetes   HPI Clifford Brown presents for f/up - He has been able to lose 3 pounds with diet and exercise. He feels well today and offers no complaints.  Outpatient Medications Prior to Visit  Medication Sig Dispense Refill  . aspirin EC 81 MG tablet Take 1 tablet (81 mg total) by mouth daily. 90 tablet 3  . Azelastine-Fluticasone (DYMISTA) 137-50 MCG/ACT SUSP Place 2 puffs into the nose 2 (two) times daily. 23 g 11  . B-D ULTRAFINE III SHORT PEN 31G X 8 MM MISC USE DAILY WITH TOUJEO 100 each 3  . Exenatide ER (BYDUREON BCISE) 2 MG/0.85ML AUIJ Inject 1 Act into the skin once a week. 12 pen 1  . FREESTYLE LITE test strip USE AS DIRECTED 3 TIMES A DAY 100 each 2  . Insulin Glargine (TOUJEO SOLOSTAR) 300 UNIT/ML SOPN Inject 40 Units into the skin daily. 1.5 mL 5  . metFORMIN (GLUCOPHAGE-XR) 750 MG 24 hr tablet TAKE 2 TABLETS (1,500 MG TOTAL) BY MOUTH DAILY WITH BREAKFAST. 180 tablet 1  . olmesartan (BENICAR) 40 MG tablet TAKE 1 TABLET BY MOUTH EVERY DAY 90 tablet 1  . rosuvastatin (CRESTOR) 10 MG tablet TAKE 1 TABLET (10 MG TOTAL) BY MOUTH DAILY. 90 tablet 3   No facility-administered medications prior to visit.     ROS Review of Systems  Constitutional: Negative.  Negative for appetite change, diaphoresis, fatigue and unexpected weight change.  HENT: Negative.   Eyes: Negative for visual disturbance.  Respiratory: Negative for cough, chest tightness, shortness of breath and wheezing.   Cardiovascular: Negative for chest pain, palpitations and leg swelling.  Gastrointestinal: Negative for abdominal pain, constipation, diarrhea, nausea and vomiting.  Endocrine: Negative.  Negative for polydipsia, polyphagia and polyuria.  Genitourinary: Negative.  Negative for difficulty urinating, dysuria, frequency, hematuria and urgency.  Musculoskeletal: Negative.  Negative for  back pain and myalgias.  Skin: Negative.   Neurological: Negative.  Negative for dizziness and weakness.  Hematological: Negative for adenopathy. Does not bruise/bleed easily.  Psychiatric/Behavioral: Negative.     Objective:  BP 124/80 (BP Location: Left Arm, Patient Position: Sitting, Cuff Size: Normal)   Pulse 89   Temp 98.4 F (36.9 C) (Oral)   Resp 16   Ht 5\' 10"  (1.778 m)   Wt 205 lb (93 kg)   SpO2 98%   BMI 29.41 kg/m   BP Readings from Last 3 Encounters:  09/26/16 124/80  07/04/16 128/88  05/02/16 122/80    Wt Readings from Last 3 Encounters:  09/26/16 205 lb (93 kg)  07/04/16 207 lb 8 oz (94.1 kg)  05/02/16 203 lb 8 oz (92.3 kg)    Physical Exam  Constitutional: He is oriented to person, place, and time. No distress.  HENT:  Mouth/Throat: Oropharynx is clear and moist. No oropharyngeal exudate.  Eyes: Conjunctivae are normal. Right eye exhibits no discharge. Left eye exhibits no discharge. No scleral icterus.  Neck: Normal range of motion. Neck supple. No JVD present. No thyromegaly present.  Cardiovascular: Normal rate, regular rhythm and intact distal pulses.  Exam reveals no gallop and no friction rub.   No murmur heard. Pulmonary/Chest: Effort normal and breath sounds normal. No respiratory distress. He has no wheezes. He has no rales. He exhibits no tenderness.  Abdominal: Soft. Bowel sounds are normal. He exhibits no distension and no mass. There is no tenderness.  There is no rebound and no guarding.  Musculoskeletal: Normal range of motion. He exhibits no edema, tenderness or deformity.  Lymphadenopathy:    He has no cervical adenopathy.  Neurological: He is alert and oriented to person, place, and time.  Skin: Skin is warm and dry. No rash noted. He is not diaphoretic. No erythema. No pallor.  Vitals reviewed.   Lab Results  Component Value Date   WBC 4.5 05/02/2016   HGB 16.0 05/02/2016   HCT 45.9 05/02/2016   PLT 175.0 05/02/2016   GLUCOSE 85  07/04/2016   CHOL 164 05/02/2016   TRIG 81.0 05/02/2016   HDL 64.90 05/02/2016   LDLDIRECT 183.6 01/31/2011   LDLCALC 83 05/02/2016   ALT 25 05/02/2016   AST 18 05/02/2016   NA 140 07/04/2016   K 4.6 07/04/2016   CL 105 07/04/2016   CREATININE 1.07 07/04/2016   BUN 14 07/04/2016   CO2 27 07/04/2016   TSH 5.23 (H) 09/26/2016   PSA 0.00 (L) 05/02/2016   HGBA1C 7.1 (H) 09/26/2016   MICROALBUR 3.2 (H) 05/02/2016    No results found.  Assessment & Plan:   Clifford Brown was seen today for hypothyroidism and diabetes.  Diagnoses and all orders for this visit:  Hypothyroidism, unspecified type- he has no symptoms of hypothyroidism. His TSH has been mildly elevated but stable over the last 2 years. At this time I do not think it would be beneficial to treat with levothyroxine. -     Thyroid Panel With TSH; Future  Type 2 diabetes mellitus with complication, without long-term current use of insulin ([place])- his A1c is down to 7.1%. He was praised for his lifestyle modifications. We'll continue the current regimen for blood sugar control. -     Hemoglobin A1c; Future   I am having [name] maintain his aspirin EC, Azelastine-Fluticasone, B-D ULTRAFINE III SHORT PEN, rosuvastatin, FREESTYLE LITE, metFORMIN, Exenatide ER, Insulin Glargine, and olmesartan.  No orders of the defined types were placed in this encounter.    Follow-up: Return in about 6 months (around 03/29/2017).  [name], MD

## 2016-09-26 NOTE — Patient Instructions (Signed)

## 2016-09-27 ENCOUNTER — Encounter: Payer: Self-pay | Admitting: Internal Medicine

## 2016-10-01 ENCOUNTER — Other Ambulatory Visit: Payer: Self-pay | Admitting: Internal Medicine

## 2016-10-03 ENCOUNTER — Other Ambulatory Visit: Payer: Self-pay | Admitting: Internal Medicine

## 2016-10-03 DIAGNOSIS — E118 Type 2 diabetes mellitus with unspecified complications: Secondary | ICD-10-CM

## 2016-10-14 ENCOUNTER — Other Ambulatory Visit: Payer: Self-pay | Admitting: Internal Medicine

## 2016-10-14 DIAGNOSIS — E118 Type 2 diabetes mellitus with unspecified complications: Secondary | ICD-10-CM

## 2016-10-24 ENCOUNTER — Other Ambulatory Visit: Payer: Self-pay | Admitting: Internal Medicine

## 2016-10-24 DIAGNOSIS — E118 Type 2 diabetes mellitus with unspecified complications: Secondary | ICD-10-CM

## 2016-11-18 DIAGNOSIS — Z8546 Personal history of malignant neoplasm of prostate: Secondary | ICD-10-CM | POA: Diagnosis not present

## 2016-11-18 LAB — PSA: PSA: 0.015

## 2016-11-30 DIAGNOSIS — N5201 Erectile dysfunction due to arterial insufficiency: Secondary | ICD-10-CM | POA: Diagnosis not present

## 2016-11-30 DIAGNOSIS — Z8546 Personal history of malignant neoplasm of prostate: Secondary | ICD-10-CM | POA: Diagnosis not present

## 2016-12-17 ENCOUNTER — Other Ambulatory Visit: Payer: Self-pay | Admitting: Internal Medicine

## 2017-01-17 ENCOUNTER — Other Ambulatory Visit: Payer: Self-pay | Admitting: Internal Medicine

## 2017-01-17 DIAGNOSIS — I10 Essential (primary) hypertension: Secondary | ICD-10-CM

## 2017-01-17 DIAGNOSIS — E118 Type 2 diabetes mellitus with unspecified complications: Secondary | ICD-10-CM

## 2017-02-22 ENCOUNTER — Other Ambulatory Visit: Payer: Self-pay | Admitting: Internal Medicine

## 2017-03-16 ENCOUNTER — Telehealth: Payer: Self-pay

## 2017-03-16 ENCOUNTER — Other Ambulatory Visit: Payer: Self-pay | Admitting: Internal Medicine

## 2017-03-16 NOTE — Telephone Encounter (Signed)
Faxed received that toujeo solostar is not covered.   Alternatives are basaglar or levemir. Please advise if change is appropriate or if I need to start a PA for pt.

## 2017-03-16 NOTE — Telephone Encounter (Signed)
He is due for OV and A1C

## 2017-03-17 NOTE — Telephone Encounter (Signed)
Pt contacted and stated that he had an appt scheduled.  Pt has now been scheduled and stated that he has plently of insulin to get him through to the appt.

## 2017-03-29 ENCOUNTER — Encounter: Payer: Self-pay | Admitting: Internal Medicine

## 2017-03-29 ENCOUNTER — Ambulatory Visit (INDEPENDENT_AMBULATORY_CARE_PROVIDER_SITE_OTHER): Payer: Medicare Other | Admitting: Internal Medicine

## 2017-03-29 ENCOUNTER — Other Ambulatory Visit (INDEPENDENT_AMBULATORY_CARE_PROVIDER_SITE_OTHER): Payer: Medicare Other

## 2017-03-29 VITALS — BP 102/88 | HR 92 | Temp 97.8°F | Resp 16 | Ht 70.0 in | Wt 204.0 lb

## 2017-03-29 DIAGNOSIS — I10 Essential (primary) hypertension: Secondary | ICD-10-CM | POA: Diagnosis not present

## 2017-03-29 DIAGNOSIS — E785 Hyperlipidemia, unspecified: Secondary | ICD-10-CM | POA: Diagnosis not present

## 2017-03-29 DIAGNOSIS — Z794 Long term (current) use of insulin: Secondary | ICD-10-CM | POA: Diagnosis not present

## 2017-03-29 DIAGNOSIS — E118 Type 2 diabetes mellitus with unspecified complications: Secondary | ICD-10-CM | POA: Diagnosis not present

## 2017-03-29 DIAGNOSIS — E039 Hypothyroidism, unspecified: Secondary | ICD-10-CM | POA: Diagnosis not present

## 2017-03-29 LAB — CBC WITH DIFFERENTIAL/PLATELET
BASOS PCT: 1 % (ref 0.0–3.0)
Basophils Absolute: 0.1 10*3/uL (ref 0.0–0.1)
EOS PCT: 2.4 % (ref 0.0–5.0)
Eosinophils Absolute: 0.1 10*3/uL (ref 0.0–0.7)
HCT: 49.4 % (ref 39.0–52.0)
Hemoglobin: 16.6 g/dL (ref 13.0–17.0)
LYMPHS ABS: 1.6 10*3/uL (ref 0.7–4.0)
Lymphocytes Relative: 26.4 % (ref 12.0–46.0)
MCHC: 33.5 g/dL (ref 30.0–36.0)
MCV: 90.1 fl (ref 78.0–100.0)
MONOS PCT: 6.9 % (ref 3.0–12.0)
Monocytes Absolute: 0.4 10*3/uL (ref 0.1–1.0)
NEUTROS PCT: 63.3 % (ref 43.0–77.0)
Neutro Abs: 3.7 10*3/uL (ref 1.4–7.7)
Platelets: 229 10*3/uL (ref 150.0–400.0)
RBC: 5.48 Mil/uL (ref 4.22–5.81)
RDW: 12.7 % (ref 11.5–15.5)
WBC: 5.9 10*3/uL (ref 4.0–10.5)

## 2017-03-29 LAB — LIPID PANEL
CHOLESTEROL: 147 mg/dL (ref 0–200)
HDL: 56.9 mg/dL (ref >39.00)
LDL Cholesterol: 65 mg/dL (ref 0–99)
NONHDL: 90.13
TRIGLYCERIDES: 124 mg/dL (ref 0.0–149.0)
Total CHOL/HDL Ratio: 3
VLDL: 24.8 mg/dL (ref 0.0–40.0)

## 2017-03-29 LAB — COMPREHENSIVE METABOLIC PANEL
ALK PHOS: 86 U/L (ref 39–117)
ALT: 25 U/L (ref 0–53)
AST: 20 U/L (ref 0–37)
Albumin: 4.2 g/dL (ref 3.5–5.2)
BUN: 14 mg/dL (ref 6–23)
CO2: 25 mEq/L (ref 19–32)
Calcium: 9.7 mg/dL (ref 8.4–10.5)
Chloride: 104 mEq/L (ref 96–112)
Creatinine, Ser: 1.04 mg/dL (ref 0.40–1.50)
GFR: 75.83 mL/min (ref >60.00)
GLUCOSE: 153 mg/dL — AB (ref 70–99)
POTASSIUM: 4.1 meq/L (ref 3.5–5.1)
Sodium: 138 mEq/L (ref 135–145)
TOTAL PROTEIN: 7.2 g/dL (ref 6.0–8.3)
Total Bilirubin: 0.8 mg/dL (ref 0.2–1.2)

## 2017-03-29 LAB — MICROALBUMIN / CREATININE URINE RATIO
CREATININE, U: 112.6 mg/dL
MICROALB/CREAT RATIO: 1.9 mg/g (ref 0.0–30.0)
Microalb, Ur: 2.1 mg/dL — ABNORMAL HIGH (ref 0.0–1.9)

## 2017-03-29 LAB — URINALYSIS, ROUTINE W REFLEX MICROSCOPIC
Bilirubin Urine: NEGATIVE
Hgb urine dipstick: NEGATIVE
Ketones, ur: NEGATIVE
Leukocytes, UA: NEGATIVE
Nitrite: NEGATIVE
PH: 5.5 (ref 5.0–8.0)
RBC / HPF: NONE SEEN (ref 0–?)
TOTAL PROTEIN, URINE-UPE24: NEGATIVE
URINE GLUCOSE: 500 — AB
Urobilinogen, UA: 0.2 (ref 0.0–1.0)
WBC, UA: NONE SEEN (ref 0–?)

## 2017-03-29 LAB — HEMOGLOBIN A1C: Hgb A1c MFr Bld: 8.3 % — ABNORMAL HIGH (ref 4.6–6.5)

## 2017-03-29 MED ORDER — DAPAGLIFLOZIN PROPANEDIOL 5 MG PO TABS: 5.0000 mg | ORAL_TABLET | Freq: Every day | ORAL | 1 refills | Status: DC

## 2017-03-29 MED ORDER — BASAGLAR KWIKPEN 100 UNIT/ML ~~LOC~~ SOPN: 30.0000 [IU] | PEN_INJECTOR | Freq: Every day | SUBCUTANEOUS | 1 refills | Status: DC

## 2017-03-29 MED ORDER — CANAGLIFLOZIN 100 MG PO TABS: 100.0000 mg | ORAL_TABLET | Freq: Every day | ORAL | 1 refills | Status: DC

## 2017-03-29 NOTE — Progress Notes (Signed)
Subjective:  Patient ID: Clifford Brown, male    DOB: [birth year]  Age: 67 y.o. MRN: [id number]  CC: Hypertension; Hypothyroidism; Hyperlipidemia; and Diabetes   HPI [name] presents for f/up - He does not think his blood sugars are well controlled.  He has not been using the basal insulin over the last few weeks because insurance would not cover it anymore.  He denies polyuria, polydipsia, or polyphagia.  He denies fatigue or weight gain.  Outpatient Medications Prior to Visit  Medication Sig Dispense Refill  . aspirin EC 81 MG tablet Take 1 tablet (81 mg total) by mouth daily. 90 tablet 3  . Azelastine-Fluticasone (DYMISTA) 137-50 MCG/ACT SUSP Place 2 puffs into the nose 2 (two) times daily. 23 g 11  . B-D ULTRAFINE III SHORT PEN 31G X 8 MM MISC USE DAILY WITH TOUJEO 100 each 3  . BYDUREON BCISE 2 MG/0.85ML AUIJ INJECT 1 SYRINGE INTO THE SKIN ONCE A WEEK. 12 pen 1  . FREESTYLE LITE test strip USE AS DIRECTED 3 TIMES A DAY 100 each 2  . metFORMIN (GLUCOPHAGE-XR) 750 MG 24 hr tablet Take 2 tablets (1,500 mg total) by mouth daily with breakfast. 180 tablet 1  . olmesartan (BENICAR) 40 MG tablet TAKE 1 TABLET BY MOUTH EVERY DAY 90 tablet 0  . rosuvastatin (CRESTOR) 10 MG tablet Take 1 tablet (10 mg total) by mouth daily. 90 tablet 1  . Insulin Glargine (TOUJEO SOLOSTAR) 300 UNIT/ML SOPN Inject 40 Units into the skin daily. 1.5 mL 5  . TOUJEO SOLOSTAR 300 UNIT/ML SOPN INJECT 50 UNITS INTO THE SKIN DAILY. 4.5 mL 5   No facility-administered medications prior to visit.     ROS Review of Systems  Constitutional: Negative for appetite change, diaphoresis, fatigue and unexpected weight change.  HENT: Negative.   Eyes: Negative for visual disturbance.  Respiratory: Negative for cough, chest tightness, shortness of breath and wheezing.   Cardiovascular: Negative for chest pain, palpitations and leg swelling.  Gastrointestinal: Negative for abdominal pain, constipation, diarrhea, nausea  and vomiting.  Endocrine: Negative for cold intolerance, heat intolerance, polydipsia, polyphagia and polyuria.  Genitourinary: Negative.  Negative for difficulty urinating.  Musculoskeletal: Negative.  Negative for back pain, myalgias and neck pain.  Skin: Negative.  Negative for color change and rash.  Allergic/Immunologic: Negative.   Neurological: Negative.  Negative for dizziness, weakness and light-headedness.  Hematological: Negative for adenopathy. Does not bruise/bleed easily.  Psychiatric/Behavioral: Negative.     Objective:  BP 102/88   Pulse 92   Temp 97.8 F (36.6 C)   Resp 16   Ht 5\' 10"  (1.778 m)   Wt 204 lb (92.5 kg)   SpO2 96%   BMI 29.27 kg/m   BP Readings from Last 3 Encounters:  03/29/17 102/88  09/26/16 124/80  07/04/16 128/88    Wt Readings from Last 3 Encounters:  03/29/17 204 lb (92.5 kg)  09/26/16 205 lb (93 kg)  07/04/16 207 lb 8 oz (94.1 kg)    Physical Exam  Constitutional: He is oriented to person, place, and time. No distress.  HENT:  Mouth/Throat: Oropharynx is clear and moist. No oropharyngeal exudate.  Eyes: Conjunctivae are normal. Left eye exhibits no discharge. No scleral icterus.  Neck: Normal range of motion. Neck supple. No JVD present. No thyromegaly present.  Cardiovascular: Normal rate, regular rhythm and normal heart sounds. Exam reveals no gallop.  No murmur heard. Pulmonary/Chest: Effort normal and breath sounds normal. No respiratory distress. He has no wheezes.  He has no rales.  Abdominal: Soft. Bowel sounds are normal. He exhibits no distension and no mass. There is no tenderness.  Musculoskeletal: Normal range of motion. He exhibits no edema, tenderness or deformity.  Neurological: He is alert and oriented to person, place, and time.  Skin: Skin is warm and dry. No rash noted. He is not diaphoretic. No erythema. No pallor.  Vitals reviewed.   Lab Results  Component Value Date   WBC 5.9 03/29/2017   HGB 16.6  03/29/2017   HCT 49.4 03/29/2017   PLT 229.0 03/29/2017   GLUCOSE 153 (H) 03/29/2017   CHOL 147 03/29/2017   TRIG 124.0 03/29/2017   HDL 56.90 03/29/2017   LDLDIRECT 183.6 01/31/2011   LDLCALC 65 03/29/2017   ALT 25 03/29/2017   AST 20 03/29/2017   NA 138 03/29/2017   K 4.1 03/29/2017   CL 104 03/29/2017   CREATININE 1.04 03/29/2017   BUN 14 03/29/2017   CO2 25 03/29/2017   TSH 5.53 (H) 03/29/2017   PSA 0.00 (L) 05/02/2016   HGBA1C 8.3 (H) 03/29/2017   MICROALBUR 2.1 (H) 03/29/2017    No results found.  Assessment & Plan:   [place] was seen today for hypertension, hypothyroidism, hyperlipidemia and diabetes.  Diagnoses and all orders for this visit:  Essential hypertension, benign- His blood pressure is adequately well controlled.  Electrolytes and renal function are normal. -     Comprehensive metabolic panel; Future -     CBC with Differential/Platelet; Future -     Urinalysis, Routine w reflex microscopic; Future  Acquired hypothyroidism- His TSH is very mildly elevated but he is not symptomatic with this so at this time I do not recommend starting thyroid replacement therapy. -     Thyroid Panel With TSH; Future  Type 2 diabetes mellitus with complication, with long-term current use of insulin ([place])- Will change to another basal insulin.  Will continue the GLP-1 agonist and metformin.  His A1c is up to 8.3% so we will also add an SGLT-2 inhibitor for better blood sugar control and to mitigate the microalbuminuria that he has developed. -     Microalbumin / creatinine urine ratio; Future -     Hemoglobin A1c; Future -     Insulin Glargine (BASAGLAR KWIKPEN) 100 UNIT/ML SOPN; Inject 0.3 mLs (30 Units total) into the skin at bedtime. -     Discontinue: dapagliflozin propanediol (FARXIGA) 5 MG TABS tablet; Take 5 mg by mouth daily. -     canagliflozin (INVOKANA) 100 MG TABS tablet; Take 1 tablet (100 mg total) by mouth daily before breakfast.  Dyslipidemia (high LDL; low  HDL)- He has achieved his LDL goal is doing well on the statin. -     Lipid panel; Future -     Thyroid Panel With TSH; Future   I have discontinued [name]'s Insulin Glargine, TOUJEO SOLOSTAR, and dapagliflozin propanediol. I am also having him start on [facility] and canagliflozin. Additionally, I am having him maintain his aspirin EC, Azelastine-Fluticasone, FREESTYLE LITE, BYDUREON BCISE, metFORMIN, rosuvastatin, olmesartan, and B-D ULTRAFINE III SHORT PEN.  Meds ordered this encounter  Medications  . Insulin Glargine (BASAGLAR KWIKPEN) 100 UNIT/ML SOPN    Sig: Inject 0.3 mLs (30 Units total) into the skin at bedtime.    Dispense:  9 mL    Refill:  1  . DISCONTD: dapagliflozin propanediol (FARXIGA) 5 MG TABS tablet    Sig: Take 5 mg by mouth daily.    Dispense:  90 tablet    Refill:  1  . canagliflozin (INVOKANA) 100 MG TABS tablet    Sig: Take 1 tablet (100 mg total) by mouth daily before breakfast.    Dispense:  90 tablet    Refill:  1     Follow-up: Return in about 6 months (around 09/26/2017).  [name], MD

## 2017-03-29 NOTE — Patient Instructions (Signed)

## 2017-03-30 LAB — THYROID PANEL WITH TSH
FREE THYROXINE INDEX: 2.1 (ref 1.4–3.8)
T3 Uptake: 34 % (ref 22–35)
T4, Total: 6.1 ug/dL (ref 4.9–10.5)
TSH: 5.53 mIU/L — ABNORMAL HIGH (ref 0.40–4.50)

## 2017-03-31 ENCOUNTER — Encounter: Payer: Self-pay | Admitting: Internal Medicine

## 2017-04-05 ENCOUNTER — Other Ambulatory Visit: Payer: Self-pay | Admitting: Internal Medicine

## 2017-04-05 DIAGNOSIS — E118 Type 2 diabetes mellitus with unspecified complications: Secondary | ICD-10-CM

## 2017-04-19 ENCOUNTER — Other Ambulatory Visit: Payer: Self-pay | Admitting: Internal Medicine

## 2017-04-19 DIAGNOSIS — E118 Type 2 diabetes mellitus with unspecified complications: Secondary | ICD-10-CM

## 2017-04-19 DIAGNOSIS — I10 Essential (primary) hypertension: Secondary | ICD-10-CM

## 2017-04-19 NOTE — Telephone Encounter (Signed)
LOV 03/29/2017. Pt to follow up in 6 months.  I will send a refill for 90 day with one refill.  Will you contact pt and ask him to schedule his 6 month follow up with PCP please.

## 2017-04-19 NOTE — Telephone Encounter (Signed)
Appointment scheduled.

## 2017-04-21 ENCOUNTER — Other Ambulatory Visit: Payer: Self-pay | Admitting: Internal Medicine

## 2017-05-22 ENCOUNTER — Other Ambulatory Visit: Payer: Self-pay | Admitting: Internal Medicine

## 2017-05-22 DIAGNOSIS — E118 Type 2 diabetes mellitus with unspecified complications: Secondary | ICD-10-CM

## 2017-05-22 DIAGNOSIS — Z794 Long term (current) use of insulin: Principal | ICD-10-CM

## 2017-05-23 LAB — HM DIABETES EYE EXAM

## 2017-06-12 ENCOUNTER — Other Ambulatory Visit: Payer: Self-pay | Admitting: Internal Medicine

## 2017-06-13 ENCOUNTER — Other Ambulatory Visit: Payer: Self-pay | Admitting: Internal Medicine

## 2017-06-13 DIAGNOSIS — E118 Type 2 diabetes mellitus with unspecified complications: Secondary | ICD-10-CM

## 2017-06-13 DIAGNOSIS — Z794 Long term (current) use of insulin: Principal | ICD-10-CM

## 2017-07-10 DIAGNOSIS — E119 Type 2 diabetes mellitus without complications: Secondary | ICD-10-CM | POA: Diagnosis not present

## 2017-08-03 ENCOUNTER — Other Ambulatory Visit: Payer: Self-pay | Admitting: Internal Medicine

## 2017-08-03 DIAGNOSIS — Z794 Long term (current) use of insulin: Principal | ICD-10-CM

## 2017-08-03 DIAGNOSIS — E118 Type 2 diabetes mellitus with unspecified complications: Secondary | ICD-10-CM

## 2017-08-11 ENCOUNTER — Encounter: Payer: Self-pay | Admitting: Internal Medicine

## 2017-08-14 ENCOUNTER — Other Ambulatory Visit: Payer: Self-pay | Admitting: Internal Medicine

## 2017-08-14 DIAGNOSIS — I10 Essential (primary) hypertension: Secondary | ICD-10-CM

## 2017-08-14 DIAGNOSIS — E118 Type 2 diabetes mellitus with unspecified complications: Secondary | ICD-10-CM

## 2017-08-14 MED ORDER — TELMISARTAN 40 MG PO TABS: 40.0000 mg | ORAL_TABLET | Freq: Every day | ORAL | 1 refills | Status: DC

## 2017-08-28 ENCOUNTER — Other Ambulatory Visit: Payer: Self-pay | Admitting: Internal Medicine

## 2017-08-28 DIAGNOSIS — E118 Type 2 diabetes mellitus with unspecified complications: Secondary | ICD-10-CM

## 2017-08-28 DIAGNOSIS — Z794 Long term (current) use of insulin: Principal | ICD-10-CM

## 2017-09-20 ENCOUNTER — Other Ambulatory Visit (INDEPENDENT_AMBULATORY_CARE_PROVIDER_SITE_OTHER): Payer: Medicare Other

## 2017-09-20 ENCOUNTER — Encounter: Payer: Self-pay | Admitting: Internal Medicine

## 2017-09-20 ENCOUNTER — Ambulatory Visit (INDEPENDENT_AMBULATORY_CARE_PROVIDER_SITE_OTHER): Payer: Medicare Other | Admitting: Internal Medicine

## 2017-09-20 ENCOUNTER — Other Ambulatory Visit: Payer: Self-pay | Admitting: Internal Medicine

## 2017-09-20 VITALS — BP 116/82 | HR 95 | Temp 97.9°F | Resp 16 | Ht 70.0 in | Wt 198.2 lb

## 2017-09-20 DIAGNOSIS — I10 Essential (primary) hypertension: Secondary | ICD-10-CM

## 2017-09-20 DIAGNOSIS — E118 Type 2 diabetes mellitus with unspecified complications: Secondary | ICD-10-CM

## 2017-09-20 DIAGNOSIS — Z794 Long term (current) use of insulin: Principal | ICD-10-CM

## 2017-09-20 DIAGNOSIS — Z8546 Personal history of malignant neoplasm of prostate: Secondary | ICD-10-CM

## 2017-09-20 DIAGNOSIS — E039 Hypothyroidism, unspecified: Secondary | ICD-10-CM

## 2017-09-20 LAB — BASIC METABOLIC PANEL
BUN: 18 mg/dL (ref 6–23)
CO2: 26 mEq/L (ref 19–32)
Calcium: 9.7 mg/dL (ref 8.4–10.5)
Chloride: 103 mEq/L (ref 96–112)
Creatinine, Ser: 1.1 mg/dL (ref 0.40–1.50)
GFR: 70.97 mL/min (ref >60.00)
Glucose, Bld: 144 mg/dL — ABNORMAL HIGH (ref 70–99)
POTASSIUM: 4.4 meq/L (ref 3.5–5.1)
SODIUM: 136 meq/L (ref 135–145)

## 2017-09-20 LAB — POCT GLYCOSYLATED HEMOGLOBIN (HGB A1C): HEMOGLOBIN A1C: 7 % — AB (ref 4.0–5.6)

## 2017-09-20 LAB — TSH: TSH: 5.64 u[IU]/mL — AB (ref 0.35–4.50)

## 2017-09-20 LAB — HM DIABETES FOOT EXAM

## 2017-09-20 NOTE — Patient Instructions (Signed)

## 2017-09-20 NOTE — Progress Notes (Signed)
Subjective:  Patient ID: Clifford Brown, male    DOB: 07-29-1950  Age: 67 y.o. MRN: [id number]  CC: Hypertension; Hypothyroidism; and Diabetes   HPI Clifford Brown presents for f/up - He has been able to lose weight with lifestyle modifications.  He is very active and denies any recent episodes of CP, DOE, palpitations, edema, or fatigue.  He also tells me his blood sugars have been well controlled.  Outpatient Medications Prior to Visit  Medication Sig Dispense Refill  . aspirin EC 81 MG tablet Take 1 tablet (81 mg total) by mouth daily. 90 tablet 3  . Azelastine-Fluticasone (DYMISTA) 137-50 MCG/ACT SUSP Place 2 puffs into the nose 2 (two) times daily. 23 g 11  . B-D ULTRAFINE III SHORT PEN 31G X 8 MM MISC USE DAILY WITH TOUJEO 100 each 3  . BYDUREON BCISE 2 MG/0.85ML AUIJ INJECT 1 SYRINGE INTO THE SKIN ONCE A WEEK. 12 pen 1  . FREESTYLE LITE test strip USE AS DIRECTED 3 TIMES A DAY 300 each 3  . Insulin Glargine (BASAGLAR KWIKPEN) 100 UNIT/ML SOPN INJECT 30 UNITS INTO THE SKIN AT BEDTIME. 15 mL 0  . INVOKANA 100 MG TABS tablet TAKE 1 TABLET (100 MG TOTAL) BY MOUTH DAILY BEFORE BREAKFAST. 90 tablet 1  . metFORMIN (GLUCOPHAGE-XR) 750 MG 24 hr tablet TAKE 2 TABLETS (1,500 MG TOTAL) BY MOUTH DAILY WITH BREAKFAST. 180 tablet 1  . rosuvastatin (CRESTOR) 10 MG tablet TAKE 1 TABLET (10 MG TOTAL) BY MOUTH DAILY. 90 tablet 1  . telmisartan (MICARDIS) 40 MG tablet Take 1 tablet (40 mg total) by mouth daily. 90 tablet 1  . Insulin Glargine (BASAGLAR KWIKPEN) 100 UNIT/ML SOPN INJECT 30 UNITS INTO THE SKIN AT BEDTIME. 9 mL 1   No facility-administered medications prior to visit.     ROS Review of Systems  Constitutional: Negative for appetite change, diaphoresis, fatigue and unexpected weight change.  HENT: Negative.  Negative for trouble swallowing.   Eyes: Negative for visual disturbance.  Respiratory: Negative.  Negative for chest tightness, shortness of breath and wheezing.   Cardiovascular:  Negative for chest pain, palpitations and leg swelling.  Gastrointestinal: Negative for abdominal pain, constipation, diarrhea, nausea and vomiting.  Endocrine: Negative for cold intolerance, heat intolerance, polydipsia, polyphagia and polyuria.  Genitourinary: Negative.  Negative for difficulty urinating.  Musculoskeletal: Negative.  Negative for arthralgias and myalgias.  Skin: Negative.   Allergic/Immunologic: Negative.   Neurological: Negative.  Negative for dizziness, weakness and light-headedness.  Hematological: Negative for adenopathy. Does not bruise/bleed easily.  Psychiatric/Behavioral: Negative.     Objective:  BP 116/82 (BP Location: Left Arm, Patient Position: Sitting, Cuff Size: Large)   Pulse 95   Temp 97.9 F (36.6 C) (Oral)   Resp 16   Ht 5\' 10"  (1.778 m)   Wt 198 lb 4 oz (89.9 kg)   SpO2 97%   BMI 28.45 kg/m   BP Readings from Last 3 Encounters:  09/20/17 116/82  03/29/17 102/88  09/26/16 124/80    Wt Readings from Last 3 Encounters:  09/20/17 198 lb 4 oz (89.9 kg)  03/29/17 204 lb (92.5 kg)  09/26/16 205 lb (93 kg)    Physical Exam  Constitutional: He is oriented to person, place, and time. No distress.  HENT:  Mouth/Throat: Oropharynx is clear and moist. No oropharyngeal exudate.  Eyes: Conjunctivae are normal. No scleral icterus.  Neck: Normal range of motion. Neck supple. No JVD present. No thyromegaly present.  Cardiovascular: Normal rate, regular rhythm and  normal heart sounds.  No murmur heard. Pulmonary/Chest: Effort normal and breath sounds normal. He has no wheezes. He has no rales.  Abdominal: Soft. Bowel sounds are normal. He exhibits no mass. There is no tenderness.  Musculoskeletal: Normal range of motion. He exhibits no edema, tenderness or deformity.  Neurological: He is alert and oriented to person, place, and time.  Skin: Skin is warm and dry. No rash noted. He is not diaphoretic.  Vitals reviewed.   Lab Results  Component  Value Date   WBC 5.9 03/29/2017   HGB 16.6 03/29/2017   HCT 49.4 03/29/2017   PLT 229.0 03/29/2017   GLUCOSE 144 (H) 09/20/2017   CHOL 147 03/29/2017   TRIG 124.0 03/29/2017   HDL 56.90 03/29/2017   LDLDIRECT 183.6 01/31/2011   LDLCALC 65 03/29/2017   ALT 25 03/29/2017   AST 20 03/29/2017   NA 136 09/20/2017   K 4.4 09/20/2017   CL 103 09/20/2017   CREATININE 1.10 09/20/2017   BUN 18 09/20/2017   CO2 26 09/20/2017   TSH 5.64 (H) 09/20/2017   PSA 0.015 11/18/2016   HGBA1C 7.0 (A) 09/20/2017   MICROALBUR 2.1 (H) 03/29/2017    No results found.  Assessment & Plan:   Clifford Brown was seen today for hypertension, hypothyroidism and diabetes.  Diagnoses and all orders for this visit:  Acquired hypothyroidism- He has a mild, stable elevation in his TSH.  Clinically he appears euthyroid.  This is subclinical hypothyroidism and does not require medical therapy at this time.  I will continue to monitor him for signs of hypothyroidism. -     TSH; Future  Type 2 diabetes mellitus with complication, without long-term current use of insulin ([place])- His A1c is down to 7.0%.  His blood sugars are adequately well controlled.  He was praised for his lifestyle modifications and will continue the current regimen to control his blood sugars. -     Basic metabolic panel; Future -     Cancel: Hemoglobin A1c; Future -     POCT glycosylated hemoglobin (Hb A1C)  Essential hypertension, - His blood pressure is well controlled.  Electrolytes and renal function are normal. -     Basic metabolic panel; Future  PROSTATE CANCER, HX OF -     Cancel: PSA; Future   I am having Clifford Brown maintain his aspirin EC, Azelastine-Fluticasone, B-D ULTRAFINE III SHORT PEN, metFORMIN, BYDUREON BCISE, FREESTYLE LITE, rosuvastatin, telmisartan, BASAGLAR KWIKPEN, and INVOKANA.  No orders of the defined types were placed in this encounter.    Follow-up: Return in about 6 months (around 03/23/2018).  Clifford Calico,  MD

## 2017-09-27 ENCOUNTER — Other Ambulatory Visit: Payer: Self-pay | Admitting: Internal Medicine

## 2017-09-27 DIAGNOSIS — E118 Type 2 diabetes mellitus with unspecified complications: Secondary | ICD-10-CM

## 2017-10-02 ENCOUNTER — Other Ambulatory Visit: Payer: Self-pay | Admitting: Internal Medicine

## 2017-10-02 DIAGNOSIS — E118 Type 2 diabetes mellitus with unspecified complications: Secondary | ICD-10-CM

## 2017-10-02 DIAGNOSIS — Z794 Long term (current) use of insulin: Principal | ICD-10-CM

## 2017-10-09 ENCOUNTER — Other Ambulatory Visit: Payer: Self-pay | Admitting: Internal Medicine

## 2017-10-09 DIAGNOSIS — E118 Type 2 diabetes mellitus with unspecified complications: Secondary | ICD-10-CM

## 2017-10-09 DIAGNOSIS — Z794 Long term (current) use of insulin: Principal | ICD-10-CM

## 2017-10-09 MED ORDER — BASAGLAR KWIKPEN 100 UNIT/ML ~~LOC~~ SOPN: PEN_INJECTOR | SUBCUTANEOUS | 5 refills | Status: DC

## 2017-10-11 ENCOUNTER — Encounter: Payer: Self-pay | Admitting: Internal Medicine

## 2017-10-11 DIAGNOSIS — E118 Type 2 diabetes mellitus with unspecified complications: Secondary | ICD-10-CM

## 2017-10-11 DIAGNOSIS — Z794 Long term (current) use of insulin: Principal | ICD-10-CM

## 2017-10-11 MED ORDER — ROSUVASTATIN CALCIUM 10 MG PO TABS: 10.0000 mg | ORAL_TABLET | Freq: Every day | ORAL | 1 refills | Status: DC

## 2017-10-11 MED ORDER — BASAGLAR KWIKPEN 100 UNIT/ML ~~LOC~~ SOPN: PEN_INJECTOR | SUBCUTANEOUS | 5 refills | Status: DC

## 2017-10-11 NOTE — Addendum Note (Signed)
Addended by: Aviva Signs M on: 10/11/2017 02:49 PM   Modules accepted: Orders

## 2017-10-11 NOTE — Telephone Encounter (Signed)
Patient was seen by Dr [name] on 09/20/2017 for a 6 month follow up.

## 2017-10-11 NOTE — Telephone Encounter (Signed)
Pt is due for a follow up before we can send in refills. Can you call at your convenience.

## 2017-10-12 ENCOUNTER — Other Ambulatory Visit: Payer: Self-pay

## 2017-10-23 ENCOUNTER — Ambulatory Visit (INDEPENDENT_AMBULATORY_CARE_PROVIDER_SITE_OTHER): Payer: Medicare Other | Admitting: *Deleted

## 2017-10-23 VITALS — BP 136/78 | HR 80 | Resp 18 | Ht 70.0 in | Wt 192.0 lb

## 2017-10-23 DIAGNOSIS — Z0000 Encounter for general adult medical examination without abnormal findings: Secondary | ICD-10-CM | POA: Diagnosis not present

## 2017-10-23 NOTE — Progress Notes (Addendum)
Subjective:   Clifford Brown is a 67 y.o. male who presents for Medicare Annual/Subsequent preventive examination.  Review of Systems:  No ROS.  Medicare Wellness Visit. Additional risk factors are reflected in the social history.  Cardiac Risk Factors include: advanced age (>20men, >65 women);male gender;diabetes mellitus;hypertension Sleep patterns: gets up 1 times nightly to void and sleeps 6 hours nightly.    Home Safety/Smoke Alarms: Feels safe in home. Smoke alarms in place.  Living environment; residence and Firearm Safety: 2-story house, no firearms. Lives with wife no needs for DME, good support system Seat Belt Safety/Bike Helmet: Wears seat belt.    PSA-  Lab Results  Component Value Date   PSA 0.015 11/18/2016   PSA 0.00 (L) 05/02/2016   PSA 0.00 (L) 08/21/2014       Objective:    Vitals: BP 136/78   Pulse 80   Resp 18   Ht 5\' 10"  (1.778 m)   Wt 192 lb (87.1 kg)   SpO2 98%   BMI 27.55 kg/m   Body mass index is 27.55 kg/m.  Advanced Directives 10/23/2017 05/02/2016 09/29/2014  Does Patient Have a Medical Advance Directive? No Yes No  Type of Advance Directive - [place];Living will -  Copy of [place] in Chart? - Yes -  Would patient like information on creating a medical advance directive? Yes (ED - Information included in AVS) - Yes - Educational materials given    Tobacco Social History   Tobacco Use  Smoking Status Never Smoker  Smokeless Tobacco Never Used  Tobacco Comment   Regular Exercise - Yes     Counseling given: Not Answered Comment: Regular Exercise - Yes  Past Medical History:  Diagnosis Date  . Cancer [facility])    Prostate - Dr Alinda Money   . Depression   . Diabetes mellitus without complication ([place])   . Hyperlipidemia   . Hypertension    Past Surgical History:  Procedure Laterality Date  . PROSTATE SURGERY     Family History  Problem Relation Age of Onset  . Alcohol abuse Other   .  Cancer Other        Prostate  . Stroke Neg Hx   . Kidney disease Neg Hx   . Hypertension Neg Hx   . Hyperlipidemia Neg Hx   . Heart disease Neg Hx   . Early death Neg Hx   . Diabetes Neg Hx    Social History   Socioeconomic History  . Marital status: Married    Spouse name: Not on file  . Number of children: 2  . Years of education: Not on file  . Highest education level: Not on file  Occupational History  . Not on file  Social Needs  . Financial resource strain: Not hard at all  . Food insecurity:    Worry: Never true    Inability: Never true  . Transportation needs:    Medical: No    Non-medical: No  Tobacco Use  . Smoking status: Never Smoker  . Smokeless tobacco: Never Used  . Tobacco comment: Regular Exercise - Yes  Substance and Sexual Activity  . Alcohol use: Yes    Alcohol/week: 21.0 standard drinks    Types: 21 Cans of beer per week    Comment: couple beers on day off  . Drug use: No  . Sexual activity: Yes  Lifestyle  . Physical activity:    Days per week: 3 days  Minutes per session: 50 min  . Stress: Not at all  Relationships  . Social connections:    Talks on phone: More than three times a week    Gets together: More than three times a week    Attends religious service: Not on file    Active member of club or organization: Yes    Attends meetings of clubs or organizations: More than 4 times per year    Relationship status: Married  Other Topics Concern  . Not on file  Social History Narrative  . Not on file    Outpatient Encounter Medications as of 10/23/2017  Medication Sig  . aspirin EC 81 MG tablet Take 1 tablet (81 mg total) by mouth daily.  . Azelastine-Fluticasone (DYMISTA) 137-50 MCG/ACT SUSP Place 2 puffs into the nose 2 (two) times daily. (Patient taking differently: Place 2 puffs into the nose as needed. )  . B-D ULTRAFINE III SHORT PEN 31G X 8 MM MISC USE DAILY WITH TOUJEO  . BYDUREON BCISE 2 MG/0.85ML AUIJ INJECT 1 SYRINGE INTO  THE SKIN ONCE A WEEK.  Marland Kitchen FREESTYLE LITE test strip USE AS DIRECTED 3 TIMES A DAY  . Insulin Glargine (BASAGLAR KWIKPEN) 100 UNIT/ML SOPN INJECT 30 UNITS INTO THE SKIN AT BEDTIME.  Marland Kitchen INVOKANA 100 MG TABS tablet TAKE 1 TABLET (100 MG TOTAL) BY MOUTH DAILY BEFORE BREAKFAST.  . metFORMIN (GLUCOPHAGE-XR) 750 MG 24 hr tablet TAKE 2 TABLETS (1,500 MG TOTAL) BY MOUTH DAILY WITH BREAKFAST.  . rosuvastatin (CRESTOR) 10 MG tablet Take 1 tablet (10 mg total) by mouth daily.  Marland Kitchen telmisartan (MICARDIS) 40 MG tablet Take 1 tablet (40 mg total) by mouth daily.   No facility-administered encounter medications on file as of 10/23/2017.     Activities of Daily Living In your present state of health, do you have any difficulty performing the following activities: 10/23/2017  Hearing? N  Vision? N  Difficulty concentrating or making decisions? N  Walking or climbing stairs? N  Dressing or bathing? N  Doing errands, shopping? N  Preparing Food and eating ? N  Using the Toilet? N  In the past six months, have you accidently leaked urine? N  Do you have problems with loss of bowel control? N  Managing your Medications? N  Managing your Finances? N  Housekeeping or managing your Housekeeping? N  Some recent data might be hidden    Patient Care Team: [name], MD as PCP - General   Assessment:   This is a routine wellness examination for Clifford Brown. Physical assessment deferred to PCP.  Exercise Activities and Dietary recommendations Current Exercise Habits: Home exercise routine, Type of exercise: walking, Time (Minutes): 30, Frequency (Times/Week): 3, Weekly Exercise (Minutes/Week): 90, Intensity: Mild, Exercise limited by: None identified  Diet (meal preparation, eat out, water intake, caffeinated beverages, dairy products, fruits and vegetables): in general, a "healthy" diet  , well balanced   Reviewed heart healthy and diabetic diet. Encouraged patient to increase daily water and healthy fluid  intake.  Goals    . Patient Stated     Continue to lose weight by getting back to going to [employer], ride my bike in the neighborhood, walk the dogs, and watching my diet for carbohydrates.       Fall Risk Fall Risk  10/23/2017 09/20/2017 05/02/2016 09/29/2014 01/08/2013  Falls in the past year? No No No No No    Depression Screen PHQ 2/9 Scores 10/23/2017 05/02/2016 09/29/2014 01/08/2013  PHQ -  2 Score 0 0 0 0  PHQ- 9 Score 1 - - -    Cognitive Function       Ad8 score reviewed for issues:  Issues making decisions: no  Less interest in hobbies / activities: no  Repeats questions, stories (family complaining): no  Trouble using ordinary gadgets (microwave, computer, phone):no  Forgets the month or year: no  Mismanaging finances: no  Remembering appts: no  Daily problems with thinking and/or memory: no Ad8 score is= 0  Immunization History  Administered Date(s) Administered  . Influenza Whole 12/28/2011, 01/03/2013  . Influenza, High Dose Seasonal PF 02/08/2017  . Influenza,inj,Quad PF,6+ Mos 11/25/2014  . Pneumococcal Conjugate-13 08/21/2014  . Pneumococcal Polysaccharide-23 01/08/2013  . Td 01/12/2010  . Zoster 08/21/2014   Screening Tests Health Maintenance  Topic Date Due  . INFLUENZA VACCINE  10/12/2017  . COLONOSCOPY  01/01/2018  . PNA vac Low Risk Adult (2 of 2 - PPSV23) 01/08/2018  . HEMOGLOBIN A1C  03/23/2018  . OPHTHALMOLOGY EXAM  05/24/2018  . FOOT EXAM  09/21/2018  . TETANUS/TDAP  01/13/2020  . Hepatitis C Screening  Completed      Plan:     Continue doing brain stimulating activities (puzzles, reading, adult coloring books, staying active) to keep memory sharp.   Continue to eat heart healthy diet (full of fruits, vegetables, whole grains, lean protein, water--limit salt, fat, and sugar intake) and increase physical activity as tolerated.  I have personally reviewed and noted the following in the patient's chart:   . Medical and  social history . Use of alcohol, tobacco or illicit drugs  . Current medications and supplements . Functional ability and status . Nutritional status . Physical activity . Advanced directives . List of other physicians . Vitals . Screenings to include cognitive, depression, and falls . Referrals and appointments  In addition, I have reviewed and discussed with patient certain preventive protocols, quality metrics, and best practice recommendations. A written personalized care plan for preventive services as well as general preventive health recommendations were provided to patient.   Medical screening examination/treatment/procedure(s) were performed by non-physician practitioner and as supervising physician I was immediately available for consultation/collaboration. I agree with above. [name], MD   Michiel Cowboy, RN  10/23/2017

## 2017-10-23 NOTE — Patient Instructions (Addendum)
Continue doing brain stimulating activities (puzzles, reading, adult coloring books, staying active) to keep memory sharp.   Continue to eat heart healthy diet (full of fruits, vegetables, whole grains, lean protein, water--limit salt, fat, and sugar intake) and increase physical activity as tolerated.   Clifford Brown , Thank you for taking time to come for your Medicare Wellness Visit. I appreciate your ongoing commitment to your health goals. Please review the following plan we discussed and let me know if I can assist you in the future.   These are the goals we discussed: Goals    . Patient Stated     Continue to lose weight by getting back to going to [employer], ride my bike in the neighborhood, walk the dogs, and watching my diet for carbohydrates.       This is a list of the screening recommended for you and due dates:  Health Maintenance  Topic Date Due  . Flu Shot  10/12/2017  . Colon Cancer Screening  01/01/2018  . Pneumonia vaccines (2 of 2 - PPSV23) 01/08/2018  . Hemoglobin A1C  03/23/2018  . Eye exam for diabetics  05/24/2018  . Complete foot exam   09/21/2018  . Tetanus Vaccine  01/13/2020  .  Hepatitis C: One time screening is recommended by Center for Disease Control  (CDC) for  adults born from [birth year] through 1965.   Completed     Health Maintenance, Male A healthy lifestyle and preventive care is important for your health and wellness. Ask your health care provider about what schedule of regular examinations is right for you. What should I know about weight and diet? Eat a Healthy Diet  Eat plenty of vegetables, fruits, whole grains, low-fat dairy products, and lean protein.  Do not eat a lot of foods high in solid fats, added sugars, or salt.  Maintain a Healthy Weight Regular exercise can help you achieve or maintain a healthy weight. You should:  Do at least 150 minutes of exercise each week. The exercise should increase your heart rate and make you sweat  (moderate-intensity exercise).  Do strength-training exercises at least twice a week.  Watch Your Levels of Cholesterol and Blood Lipids  Have your blood tested for lipids and cholesterol every 5 years starting at 67 years of age. If you are at high risk for heart disease, you should start having your blood tested when you are 67 years old. You may need to have your cholesterol levels checked more often if: ? Your lipid or cholesterol levels are high. ? You are older than 67 years of age. ? You are at high risk for heart disease.  What should I know about cancer screening? Many types of cancers can be detected early and may often be prevented. Lung Cancer  You should be screened every year for lung cancer if: ? You are a current smoker who has smoked for at least 30 years. ? You are a former smoker who has quit within the past 15 years.  Talk to your health care provider about your screening options, when you should start screening, and how often you should be screened.  Colorectal Cancer  Routine colorectal cancer screening usually begins at 67 years of age and should be repeated every 5-10 years until you are 67 years old. You may need to be screened more often if early forms of precancerous polyps or small growths are found. Your health care provider may recommend screening at an earlier age if you  have risk factors for colon cancer.  Your health care provider may recommend using home test kits to check for hidden blood in the stool.  A small camera at the end of a tube can be used to examine your colon (sigmoidoscopy or colonoscopy). This checks for the earliest forms of colorectal cancer.  Prostate and Testicular Cancer  Depending on your age and overall health, your health care provider may do certain tests to screen for prostate and testicular cancer.  Talk to your health care provider about any symptoms or concerns you have about testicular or prostate cancer.  Skin  Cancer  Check your skin from head to toe regularly.  Tell your health care provider about any new moles or changes in moles, especially if: ? There is a change in a mole's size, shape, or color. ? You have a mole that is larger than a pencil eraser.  Always use sunscreen. Apply sunscreen liberally and repeat throughout the day.  Protect yourself by wearing long sleeves, pants, a wide-brimmed hat, and sunglasses when outside.  What should I know about heart disease, diabetes, and high blood pressure?  If you are 59-55 years of age, have your blood pressure checked every 3-5 years. If you are 20 years of age or older, have your blood pressure checked every year. You should have your blood pressure measured twice-once when you are at a hospital or clinic, and once when you are not at a hospital or clinic. Record the average of the two measurements. To check your blood pressure when you are not at a hospital or clinic, you can use: ? An automated blood pressure machine at a pharmacy. ? A home blood pressure monitor.  Talk to your health care provider about your target blood pressure.  If you are between 77-86 years old, ask your health care provider if you should take aspirin to prevent heart disease.  Have regular diabetes screenings by checking your fasting blood sugar level. ? If you are at a normal weight and have a low risk for diabetes, have this test once every three years after the age of 20. ? If you are overweight and have a high risk for diabetes, consider being tested at a younger age or more often.  A one-time screening for abdominal aortic aneurysm (AAA) by ultrasound is recommended for men aged 57-75 years who are current or former smokers. What should I know about preventing infection? Hepatitis B If you have a higher risk for hepatitis B, you should be screened for this virus. Talk with your health care provider to find out if you are at risk for hepatitis B  infection. Hepatitis C Blood testing is recommended for:  Everyone born from [birth year] through 1965.  Anyone with known risk factors for hepatitis C.  Sexually Transmitted Diseases (STDs)  You should be screened each year for STDs including gonorrhea and chlamydia if: ? You are sexually active and are younger than 67 years of age. ? You are older than 67 years of age and your health care provider tells you that you are at risk for this type of infection. ? Your sexual activity has changed since you were last screened and you are at an increased risk for chlamydia or gonorrhea. Ask your health care provider if you are at risk.  Talk with your health care provider about whether you are at high risk of being infected with HIV. Your health care provider may recommend a prescription medicine to help prevent HIV  infection.  What else can I do?  Schedule regular health, dental, and eye exams.  Stay current with your vaccines (immunizations).  Do not use any tobacco products, such as cigarettes, chewing tobacco, and e-cigarettes. If you need help quitting, ask your health care provider.  Limit alcohol intake to no more than 2 drinks per day. One drink equals 12 ounces of beer, 5 ounces of Carley Glendenning, or 1 ounces of hard liquor.  Do not use street drugs.  Do not share needles.  Ask your health care provider for help if you need support or information about quitting drugs.  Tell your health care provider if you often feel depressed.  Tell your health care provider if you have ever been abused or do not feel safe at home. This information is not intended to replace advice given to you by your health care provider. Make sure you discuss any questions you have with your health care provider. Document Released: 08/27/2007 Document Revised: 10/28/2015 Document Reviewed: 12/02/2014 Elsevier Interactive Patient Education  [employer].

## 2017-11-03 ENCOUNTER — Other Ambulatory Visit: Payer: Self-pay | Admitting: Internal Medicine

## 2017-11-03 DIAGNOSIS — I10 Essential (primary) hypertension: Secondary | ICD-10-CM

## 2017-11-03 DIAGNOSIS — E118 Type 2 diabetes mellitus with unspecified complications: Secondary | ICD-10-CM

## 2017-11-05 ENCOUNTER — Other Ambulatory Visit: Payer: Self-pay | Admitting: Internal Medicine

## 2017-11-05 DIAGNOSIS — E118 Type 2 diabetes mellitus with unspecified complications: Secondary | ICD-10-CM

## 2017-11-17 DIAGNOSIS — Z8546 Personal history of malignant neoplasm of prostate: Secondary | ICD-10-CM | POA: Diagnosis not present

## 2017-11-17 LAB — PSA: PSA: 0

## 2017-11-24 DIAGNOSIS — Z8546 Personal history of malignant neoplasm of prostate: Secondary | ICD-10-CM | POA: Diagnosis not present

## 2017-11-24 DIAGNOSIS — N5201 Erectile dysfunction due to arterial insufficiency: Secondary | ICD-10-CM | POA: Diagnosis not present

## 2017-12-13 ENCOUNTER — Encounter: Payer: Self-pay | Admitting: Internal Medicine

## 2018-02-21 ENCOUNTER — Other Ambulatory Visit: Payer: Self-pay | Admitting: Internal Medicine

## 2018-02-21 DIAGNOSIS — Z794 Long term (current) use of insulin: Principal | ICD-10-CM

## 2018-02-21 DIAGNOSIS — E118 Type 2 diabetes mellitus with unspecified complications: Secondary | ICD-10-CM

## 2018-03-22 ENCOUNTER — Other Ambulatory Visit: Payer: Self-pay | Admitting: Internal Medicine

## 2018-03-22 DIAGNOSIS — E118 Type 2 diabetes mellitus with unspecified complications: Secondary | ICD-10-CM

## 2018-04-08 ENCOUNTER — Other Ambulatory Visit: Payer: Self-pay | Admitting: Internal Medicine

## 2018-04-08 DIAGNOSIS — E118 Type 2 diabetes mellitus with unspecified complications: Secondary | ICD-10-CM

## 2018-04-09 DIAGNOSIS — R202 Paresthesia of skin: Secondary | ICD-10-CM | POA: Diagnosis not present

## 2018-04-09 DIAGNOSIS — R200 Anesthesia of skin: Secondary | ICD-10-CM | POA: Diagnosis not present

## 2018-04-10 ENCOUNTER — Encounter: Payer: Self-pay | Admitting: Internal Medicine

## 2018-04-10 ENCOUNTER — Other Ambulatory Visit (INDEPENDENT_AMBULATORY_CARE_PROVIDER_SITE_OTHER): Payer: Medicare Other

## 2018-04-10 ENCOUNTER — Ambulatory Visit (INDEPENDENT_AMBULATORY_CARE_PROVIDER_SITE_OTHER)
Admission: RE | Admit: 2018-04-10 | Discharge: 2018-04-10 | Disposition: A | Discharge status: Home / Self Care | Payer: Medicare Other | Source: Ambulatory Visit | Attending: Internal Medicine | Admitting: Internal Medicine

## 2018-04-10 ENCOUNTER — Ambulatory Visit (INDEPENDENT_AMBULATORY_CARE_PROVIDER_SITE_OTHER): Payer: Medicare Other | Admitting: Internal Medicine

## 2018-04-10 VITALS — BP 130/82 | HR 74 | Temp 97.9°F | Resp 16 | Ht 70.0 in | Wt 188.8 lb

## 2018-04-10 DIAGNOSIS — R202 Paresthesia of skin: Secondary | ICD-10-CM | POA: Diagnosis not present

## 2018-04-10 DIAGNOSIS — R05 Cough: Secondary | ICD-10-CM

## 2018-04-10 DIAGNOSIS — E118 Type 2 diabetes mellitus with unspecified complications: Secondary | ICD-10-CM

## 2018-04-10 DIAGNOSIS — I10 Essential (primary) hypertension: Secondary | ICD-10-CM

## 2018-04-10 DIAGNOSIS — R059 Cough, unspecified: Secondary | ICD-10-CM

## 2018-04-10 DIAGNOSIS — R200 Anesthesia of skin: Secondary | ICD-10-CM

## 2018-04-10 DIAGNOSIS — E785 Hyperlipidemia, unspecified: Secondary | ICD-10-CM | POA: Diagnosis not present

## 2018-04-10 DIAGNOSIS — Z1211 Encounter for screening for malignant neoplasm of colon: Secondary | ICD-10-CM

## 2018-04-10 LAB — URINALYSIS, ROUTINE W REFLEX MICROSCOPIC
Bilirubin Urine: NEGATIVE
Hgb urine dipstick: NEGATIVE
Ketones, ur: NEGATIVE
Leukocytes, UA: NEGATIVE
Nitrite: NEGATIVE
RBC / HPF: NONE SEEN (ref 0–?)
Specific Gravity, Urine: 1.025 (ref 1.000–1.030)
Total Protein, Urine: NEGATIVE
Urine Glucose: >=1000 — AB
Urobilinogen, UA: 0.2 (ref 0.0–1.0)
pH: 6 (ref 5.0–8.0)

## 2018-04-10 LAB — CBC WITH DIFFERENTIAL/PLATELET
Basophils Absolute: 0.1 10*3/uL (ref 0.0–0.1)
Basophils Relative: 1.1 % (ref 0.0–3.0)
Eosinophils Absolute: 0.2 10*3/uL (ref 0.0–0.7)
Eosinophils Relative: 3 % (ref 0.0–5.0)
HCT: 47.1 % (ref 39.0–52.0)
Hemoglobin: 15.8 g/dL (ref 13.0–17.0)
Lymphocytes Relative: 26.5 % (ref 12.0–46.0)
Lymphs Abs: 1.4 10*3/uL (ref 0.7–4.0)
MCHC: 33.6 g/dL (ref 30.0–36.0)
MCV: 88.4 fl (ref 78.0–100.0)
Monocytes Absolute: 0.4 10*3/uL (ref 0.1–1.0)
Monocytes Relative: 6.9 % (ref 3.0–12.0)
Neutro Abs: 3.3 10*3/uL (ref 1.4–7.7)
Neutrophils Relative %: 62.5 % (ref 43.0–77.0)
Platelets: 192 10*3/uL (ref 150.0–400.0)
RBC: 5.33 Mil/uL (ref 4.22–5.81)
RDW: 13.1 % (ref 11.5–15.5)
WBC: 5.4 10*3/uL (ref 4.0–10.5)

## 2018-04-10 LAB — COMPREHENSIVE METABOLIC PANEL
ALT: 24 U/L (ref 0–53)
AST: 19 U/L (ref 0–37)
Albumin: 4.3 g/dL (ref 3.5–5.2)
Alkaline Phosphatase: 78 U/L (ref 39–117)
BUN: 22 mg/dL (ref 6–23)
CO2: 26 meq/L (ref 19–32)
CREATININE: 1.09 mg/dL (ref 0.40–1.50)
Calcium: 10.2 mg/dL (ref 8.4–10.5)
Chloride: 105 mEq/L (ref 96–112)
GFR: 67.37 mL/min (ref >60.00)
Glucose, Bld: 88 mg/dL (ref 70–99)
Potassium: 5.2 mEq/L — ABNORMAL HIGH (ref 3.5–5.1)
Sodium: 139 mEq/L (ref 135–145)
Total Bilirubin: 0.7 mg/dL (ref 0.2–1.2)
Total Protein: 6.7 g/dL (ref 6.0–8.3)

## 2018-04-10 LAB — VITAMIN B12: Vitamin B-12: 752 pg/mL (ref 211–911)

## 2018-04-10 LAB — LIPID PANEL
CHOL/HDL RATIO: 2
Cholesterol: 156 mg/dL (ref 0–200)
HDL: 65.7 mg/dL (ref >39.00)
LDL Cholesterol: 71 mg/dL (ref 0–99)
NonHDL: 90.55
TRIGLYCERIDES: 96 mg/dL (ref 0.0–149.0)
VLDL: 19.2 mg/dL (ref 0.0–40.0)

## 2018-04-10 LAB — MICROALBUMIN / CREATININE URINE RATIO
Creatinine,U: 88.4 mg/dL
Microalb Creat Ratio: 2.3 mg/g (ref 0.0–30.0)
Microalb, Ur: 2 mg/dL — ABNORMAL HIGH (ref 0.0–1.9)

## 2018-04-10 LAB — FOLATE: Folate: >24 ng/mL (ref >5.9)

## 2018-04-10 LAB — TSH: TSH: 6.83 u[IU]/mL — ABNORMAL HIGH (ref 0.35–4.50)

## 2018-04-10 LAB — HEMOGLOBIN A1C: Hgb A1c MFr Bld: 7.5 % — ABNORMAL HIGH (ref 4.6–6.5)

## 2018-04-10 NOTE — Progress Notes (Signed)
Subjective:  Patient ID: Clifford Brown, male    DOB: 1951/01/02  Age: 68 y.o. MRN: [id number]  CC: Hypertension; Diabetes; Hyperlipidemia; and Cough   HPI [name] presents for f/up - He does not monitor his blood pressure or his blood sugar.  Based on prescription refills he would have recently run out of metformin.  It looks like is taking Invokana, the basal insulin, and the GLP-1 agonist.  Today he complains of a several month history of diffuse numbness and tingling in both upper extremities.  He denies neck or musculoskeletal pain.  He also complains of a chronic, recurrent nonproductive cough.  Outpatient Medications Prior to Visit  Medication Sig Dispense Refill  . aspirin EC 81 MG tablet Take 1 tablet (81 mg total) by mouth daily. 90 tablet 3  . Azelastine-Fluticasone (DYMISTA) 137-50 MCG/ACT SUSP Place 2 puffs into the nose 2 (two) times daily. (Patient taking differently: Place 2 puffs into the nose as needed. ) 23 g 11  . B-D ULTRAFINE III SHORT PEN 31G X 8 MM MISC USE DAILY WITH TOUJEO 100 each 3  . BYDUREON BCISE 2 MG/0.85ML AUIJ INJECT 1 SYRINGE INTO THE SKIN ONCE A WEEK. 10.2 mL 1  . FREESTYLE LITE test strip USE AS DIRECTED 3 TIMES A DAY 300 each 3  . Insulin Glargine (BASAGLAR KWIKPEN) 100 UNIT/ML SOPN INJECT 30 UNITS INTO THE SKIN AT BEDTIME. 15 mL 5  . INVOKANA 100 MG TABS tablet TAKE 1 TABLET (100 MG TOTAL) BY MOUTH DAILY BEFORE BREAKFAST. 90 tablet 1  . rosuvastatin (CRESTOR) 10 MG tablet Take 1 tablet (10 mg total) by mouth daily. 90 tablet 1  . telmisartan (MICARDIS) 40 MG tablet Take 1 tablet (40 mg total) by mouth daily. 90 tablet 1  . metFORMIN (GLUCOPHAGE-XR) 750 MG 24 hr tablet TAKE 2 TABLETS (1,500 MG TOTAL) BY MOUTH DAILY WITH BREAKFAST. 180 tablet 1   No facility-administered medications prior to visit.     ROS Review of Systems  Constitutional: Negative.  Negative for chills, diaphoresis, fatigue, fever and unexpected weight change.  HENT: Negative.   Negative for congestion, sinus pressure, sore throat, trouble swallowing and voice change.   Respiratory: Positive for cough. Negative for chest tightness, shortness of breath and wheezing.   Cardiovascular: Negative for chest pain, palpitations and leg swelling.  Gastrointestinal: Negative for abdominal pain, constipation, diarrhea, nausea and vomiting.  Endocrine: Negative for polydipsia, polyphagia and polyuria.  Genitourinary: Negative.  Negative for difficulty urinating and dysuria.  Musculoskeletal: Negative.  Negative for arthralgias, back pain, myalgias and neck pain.  Skin: Negative.  Negative for color change, pallor and rash.  Neurological: Positive for numbness. Negative for dizziness, weakness, light-headedness and headaches.  Hematological: Negative for adenopathy. Does not bruise/bleed easily.  Psychiatric/Behavioral: Negative.     Objective:  BP 130/82 (BP Location: Left Arm, Patient Position: Sitting, Cuff Size: Normal)   Pulse 74   Temp 97.9 F (36.6 C) (Oral)   Resp 16   Ht 5\' 10"  (1.778 m)   Wt 188 lb 12 oz (85.6 kg)   SpO2 98%   BMI 27.08 kg/m   BP Readings from Last 3 Encounters:  04/10/18 130/82  10/23/17 136/78  09/20/17 116/82    Wt Readings from Last 3 Encounters:  04/10/18 188 lb 12 oz (85.6 kg)  10/23/17 192 lb (87.1 kg)  09/20/17 198 lb 4 oz (89.9 kg)    Physical Exam Vitals signs reviewed.  Constitutional:      Appearance: He  is not ill-appearing or diaphoretic.  HENT:     Nose: Nose normal. No congestion or rhinorrhea.     Mouth/Throat:     Mouth: Mucous membranes are moist.     Pharynx: No oropharyngeal exudate or posterior oropharyngeal erythema.  Eyes:     General: No scleral icterus.    Conjunctiva/sclera: Conjunctivae normal.  Neck:     Musculoskeletal: Normal range of motion. No muscular tenderness.  Cardiovascular:     Rate and Rhythm: Normal rate and regular rhythm.     Heart sounds: No murmur. No gallop.   Pulmonary:      Effort: Pulmonary effort is normal.     Breath sounds: Normal breath sounds. No stridor. No wheezing, rhonchi or rales.  Abdominal:     General: Bowel sounds are normal.     Palpations: There is no hepatomegaly, splenomegaly or mass.     Tenderness: There is no abdominal tenderness.  Musculoskeletal: Normal range of motion.        General: No swelling.     Right lower leg: No edema.     Left lower leg: No edema.  Lymphadenopathy:     Cervical: No cervical adenopathy.  Skin:    General: Skin is warm and dry.     Coloration: Skin is not pale.     Findings: No erythema or rash.  Neurological:     General: No focal deficit present.     Mental Status: He is oriented to person, place, and time. Mental status is at baseline.     Cranial Nerves: Cranial nerves are intact. No cranial nerve deficit.     Sensory: Sensation is intact. No sensory deficit.     Motor: No weakness, tremor or abnormal muscle tone.     Coordination: Coordination is intact. Coordination normal.     Deep Tendon Reflexes: Reflexes normal. Babinski sign absent on the right side. Babinski sign absent on the left side.     Reflex Scores:      Tricep reflexes are 0 on the right side and 0 on the left side.      Bicep reflexes are 0 on the right side and 0 on the left side.      Brachioradialis reflexes are 0 on the right side and 0 on the left side.      Patellar reflexes are 0 on the right side and 0 on the left side.      Achilles reflexes are 0 on the right side and 0 on the left side. Psychiatric:        Mood and Affect: Mood normal.        Thought Content: Thought content normal.        Judgment: Judgment normal.     Lab Results  Component Value Date   WBC 5.4 04/10/2018   HGB 15.8 04/10/2018   HCT 47.1 04/10/2018   PLT 192.0 04/10/2018   GLUCOSE 88 04/10/2018   CHOL 156 04/10/2018   TRIG 96.0 04/10/2018   HDL 65.70 04/10/2018   LDLDIRECT 183.6 01/31/2011   LDLCALC 71 04/10/2018   ALT 24 04/10/2018    AST 19 04/10/2018   NA 139 04/10/2018   K 5.2 (H) 04/10/2018   CL 105 04/10/2018   CREATININE 1.09 04/10/2018   BUN 22 04/10/2018   CO2 26 04/10/2018   TSH 6.83 (H) 04/10/2018   PSA 0.0 11/17/2017   HGBA1C 7.5 (H) 04/10/2018   MICROALBUR 2.0 (H) 04/10/2018    .ri  Assessment &  Plan:   Lamari was seen today for hypertension, diabetes, hyperlipidemia and cough.  Diagnoses and all orders for this visit:  Numbness and tingling in both hands- His labs are negative for any metabolic or secondary causes.  I have asked him to undergo an NCS/EMG to see if there is a radiculopathy from the cervical spine, ulnar neuropathy, or carpal tunnel syndrome that would explain his symptoms. -     Vitamin B12; Future -     Vitamin B1; Future -     Folate; Future -     Ambulatory referral to Neurology  Essential hypertension, benign- His blood pressure is adequately well controlled.  Electrolytes and renal function are normal. -     Comprehensive metabolic panel; Future -     CBC with Differential/Platelet; Future -     Urinalysis, Routine w reflex microscopic; Future  Type II diabetes mellitus with manifestations ([place])- His A1c is up to 7.5%.  I have asked him to restart the Metformin and to continue the other hypoglycemic agents. -     Comprehensive metabolic panel; Future -     Hemoglobin A1c; Future -     Microalbumin / creatinine urine ratio; Future -     metFORMIN (GLUCOPHAGE-XR) 750 MG 24 hr tablet; Take 2 tablets (1,500 mg total) by mouth daily with breakfast.  Dyslipidemia (high LDL; low HDL)- He has achieved his LDL goal and is doing well on the statin. -     Lipid panel; Future -     TSH; Future  Cough- Based on his symptoms, exam, and normal chest x-ray I do not see any significant pathology here.  He may have asthma.  If his symptoms continue then he will let me know and I will refer him to pulmonology for further evaluation. -     DG Chest 2 View; Future  Colon cancer  screening -     Cologuard  Type 2 diabetes mellitus with complication, without long-term current use of insulin (HCC) -     metFORMIN (GLUCOPHAGE-XR) 750 MG 24 hr tablet; Take 2 tablets (1,500 mg total) by mouth daily with breakfast.   I am having Clifford Brown maintain his aspirin EC, Azelastine-Fluticasone, B-D ULTRAFINE III SHORT PEN, FREESTYLE LITE, telmisartan, rosuvastatin, BASAGLAR KWIKPEN, INVOKANA, BYDUREON BCISE, and metFORMIN.  Meds ordered this encounter  Medications  . metFORMIN (GLUCOPHAGE-XR) 750 MG 24 hr tablet    Sig: Take 2 tablets (1,500 mg total) by mouth daily with breakfast.    Dispense:  180 tablet    Refill:  1     Follow-up: Return in about 4 weeks (around 05/08/2018).  [name], MD

## 2018-04-10 NOTE — Patient Instructions (Signed)
Cough, Adult  Coughing is a reflex that clears your throat and your airways. Coughing helps to heal and protect your lungs. It is normal to cough occasionally, but a cough that happens with other symptoms or lasts a long time may be a sign of a condition that needs treatment. A cough may last only 2-3 weeks (acute), or it may last longer than 8 weeks (chronic). What are the causes? Coughing is commonly caused by:  Breathing in substances that irritate your lungs.  A viral or bacterial respiratory infection.  Allergies.  Asthma.  Postnasal drip.  Smoking.  Acid backing up from the stomach into the esophagus (gastroesophageal reflux).  Certain medicines.  Chronic lung problems, including COPD (or rarely, lung cancer).  Other medical conditions such as heart failure. Follow these instructions at home: Pay attention to any changes in your symptoms. Take these actions to help with your discomfort:  Take medicines only as told by your health care provider. ? If you were prescribed an antibiotic medicine, take it as told by your health care provider. Do not stop taking the antibiotic even if you start to feel better. ? Talk with your health care provider before you take a cough suppressant medicine.  Drink enough fluid to keep your urine clear or pale yellow.  If the air is dry, use a cold steam vaporizer or humidifier in your bedroom or your home to help loosen secretions.  Avoid anything that causes you to cough at work or at home.  If your cough is worse at night, try sleeping in a semi-upright position.  Avoid cigarette smoke. If you smoke, quit smoking. If you need help quitting, ask your health care provider.  Avoid caffeine.  Avoid alcohol.  Rest as needed. Contact a health care provider if:  You have new symptoms.  You cough up pus.  Your cough does not get better after 2-3 weeks, or your cough gets worse.  You cannot control your cough with suppressant  medicines and you are losing sleep.  You develop pain that is getting worse or pain that is not controlled with pain medicines.  You have a fever.  You have unexplained weight loss.  You have night sweats. Get help right away if:  You cough up blood.  You have difficulty breathing.  Your heartbeat is very fast. This information is not intended to replace advice given to you by your health care provider. Make sure you discuss any questions you have with your health care provider. Document Released: 08/27/2010 Document Revised: 08/06/2015 Document Reviewed: 05/07/2014 Elsevier Interactive Patient Education  2019 [employer] Inc[employer]  

## 2018-04-13 ENCOUNTER — Encounter: Payer: Self-pay | Admitting: Neurology

## 2018-04-13 ENCOUNTER — Encounter: Payer: Self-pay | Admitting: Internal Medicine

## 2018-04-13 LAB — VITAMIN B1: Vitamin B1 (Thiamine): 8 nmol/L (ref 8–30)

## 2018-04-13 MED ORDER — METFORMIN HCL ER 750 MG PO TB24: 1500.0000 mg | ORAL_TABLET | Freq: Every day | ORAL | 1 refills | Status: DC

## 2018-04-16 ENCOUNTER — Other Ambulatory Visit: Payer: Self-pay | Admitting: *Deleted

## 2018-04-16 DIAGNOSIS — R200 Anesthesia of skin: Secondary | ICD-10-CM

## 2018-04-16 DIAGNOSIS — R202 Paresthesia of skin: Principal | ICD-10-CM

## 2018-04-23 DIAGNOSIS — Z1211 Encounter for screening for malignant neoplasm of colon: Secondary | ICD-10-CM | POA: Diagnosis not present

## 2018-04-23 DIAGNOSIS — Z1212 Encounter for screening for malignant neoplasm of rectum: Secondary | ICD-10-CM | POA: Diagnosis not present

## 2018-04-26 ENCOUNTER — Encounter: Payer: Self-pay | Admitting: Internal Medicine

## 2018-04-26 ENCOUNTER — Ambulatory Visit (INDEPENDENT_AMBULATORY_CARE_PROVIDER_SITE_OTHER): Payer: Medicare Other | Admitting: Neurology

## 2018-04-26 DIAGNOSIS — R202 Paresthesia of skin: Secondary | ICD-10-CM | POA: Diagnosis not present

## 2018-04-26 DIAGNOSIS — G5603 Carpal tunnel syndrome, bilateral upper limbs: Secondary | ICD-10-CM

## 2018-04-26 DIAGNOSIS — R200 Anesthesia of skin: Secondary | ICD-10-CM

## 2018-04-26 LAB — COLOGUARD

## 2018-04-26 NOTE — Procedures (Signed)
[facility]  [place], [address]  [place], [place] [address] Tel: [phone] Fax:  [phone] Test Date:  04/26/2018  Patient: Clifford Brown DOB: Nov 14, 1950 Physician: Narda Amber, DO  Sex: Male Height: 5\' 10"  Ref Phys: [name], MD  ID#: [id number] Temp: 34.0C Technician:    Patient Complaints: This is a 68 year old man with diabetes mellitus referred for evaluation of bilateral hand numbness.  NCV & EMG Findings: Extensive electrodiagnostic evaluation of the left upper extremity and additional studies of the right shows:  1. Left median sensory response is absent.  Right median sensory response shows prolonged latency (4.2 ms).  Bilateral ulnar sensory responses are within normal limits.   2. Bilateral median motor responses show prolonged latency, which is worse on the left (L6.3, R4.3 ms).  Bilateral ulnar motor responses are within normal limits.   3. Chronic motor axonal loss changes are seen affecting bilateral abductor pollicis brevis muscles, without accompanied active denervation.    Impression: 1. Severe left median neuropathy at or distal to the wrist, consistent with a clinical diagnosis of carpal tunnel syndrome.   2. Moderate right median neuropathy at or distal to the wrist, consistent with a clinical diagnosis of carpal tunnel syndrome.   3. There is no evidence of a sensorimotor polyneuropathy or cervical radiculopathy.    ___________________________ Narda Amber, DO    Nerve Conduction Studies Anti Sensory Summary Table   Site NR Peak (ms) Norm Peak (ms) P-T Amp (V) Norm P-T Amp  Left Median Anti Sensory (2nd Digit)  34C  Wrist NR  <3.8  >10  Right Median Anti Sensory (2nd Digit)  34C  Wrist    4.2 <3.8 13.8 >10  Left Ulnar Anti Sensory (5th Digit)  34C  Wrist    3.2 <3.2 28.1 >5  Right Ulnar Anti Sensory (5th Digit)  34C  Wrist    2.4 <3.2 29.0 >5   Motor Summary Table   Site NR Onset (ms) Norm Onset (ms) O-P Amp (mV)  Norm O-P Amp Site1 Site2 Delta-0 (ms) Dist (cm) Vel (m/s) Norm Vel (m/s)  Left Median Motor (Abd Poll Brev)  34C  Wrist    6.3 <4.0 6.4 >5 Elbow Wrist 4.7 29.0 62 >50  Elbow    11.0  6.3         Right Median Motor (Abd Poll Brev)  34C  Wrist    4.3 <4.0 7.7 >5 Elbow Wrist 5.2 29.0 56 >50  Elbow    9.5  7.3         Left Ulnar Motor (Abd Dig Minimi)  34C  Wrist    2.0 <3.1 9.6 >7 B Elbow Wrist 4.3 23.0 53 >50  B Elbow    6.3  9.3  A Elbow B Elbow 1.9 10.0 53 >50  A Elbow    8.2  9.0         Right Ulnar Motor (Abd Dig Minimi)  34C  Wrist    2.0 <3.1 9.9 >7 B Elbow Wrist 4.0 23.0 58 >50  B Elbow    6.0  9.6  A Elbow B Elbow 1.8 10.0 56 >50  A Elbow    7.8  9.1          EMG   Side Muscle Ins Act Fibs Psw Fasc Number Recrt Dur Dur. Amp Amp. Poly Poly. Comment  Right 1stDorInt Nml Nml Nml Nml Nml Nml Nml Nml Nml Nml Nml Nml N/A  Right Abd Poll Brev Nml Nml  Nml Nml 1- Rapid Few 1+ Few 1+ Nml Nml N/A  Right PronatorTeres Nml Nml Nml Nml Nml Nml Nml Nml Nml Nml Nml Nml N/A  Right Biceps Nml Nml Nml Nml Nml Nml Nml Nml Nml Nml Nml Nml N/A  Right Triceps Nml Nml Nml Nml Nml Nml Nml Nml Nml Nml Nml Nml N/A  Right Deltoid Nml Nml Nml Nml Nml Nml Nml Nml Nml Nml Nml Nml N/A  Left 1stDorInt Nml Nml Nml Nml Nml Nml Nml Nml Nml Nml Nml Nml N/A  Left Abd Poll Brev Nml Nml Nml Nml 1- Rapid Some 1+ Some 1+ Nml Nml N/A  Left PronatorTeres Nml Nml Nml Nml Nml Nml Nml Nml Nml Nml Nml Nml N/A  Left Biceps Nml Nml Nml Nml Nml Nml Nml Nml Nml Nml Nml Nml N/A  Left Triceps Nml Nml Nml Nml Nml Nml Nml Nml Nml Nml Nml Nml N/A  Left Deltoid Nml Nml Nml Nml Nml Nml Nml Nml Nml Nml Nml Nml N/A      Waveforms:

## 2018-04-29 ENCOUNTER — Other Ambulatory Visit: Payer: Self-pay | Admitting: Internal Medicine

## 2018-04-29 DIAGNOSIS — E118 Type 2 diabetes mellitus with unspecified complications: Secondary | ICD-10-CM

## 2018-04-29 DIAGNOSIS — I10 Essential (primary) hypertension: Secondary | ICD-10-CM

## 2018-04-29 DIAGNOSIS — G5603 Carpal tunnel syndrome, bilateral upper limbs: Secondary | ICD-10-CM

## 2018-05-08 ENCOUNTER — Other Ambulatory Visit: Payer: Self-pay | Admitting: Internal Medicine

## 2018-05-16 ENCOUNTER — Encounter: Payer: Self-pay | Admitting: Internal Medicine

## 2018-05-16 ENCOUNTER — Ambulatory Visit (INDEPENDENT_AMBULATORY_CARE_PROVIDER_SITE_OTHER): Payer: Medicare Other | Admitting: Internal Medicine

## 2018-05-16 ENCOUNTER — Other Ambulatory Visit (INDEPENDENT_AMBULATORY_CARE_PROVIDER_SITE_OTHER): Payer: Medicare Other

## 2018-05-16 VITALS — BP 120/80 | HR 81 | Temp 97.8°F | Ht 70.0 in | Wt 186.8 lb

## 2018-05-16 DIAGNOSIS — E039 Hypothyroidism, unspecified: Secondary | ICD-10-CM

## 2018-05-16 DIAGNOSIS — I10 Essential (primary) hypertension: Secondary | ICD-10-CM

## 2018-05-16 DIAGNOSIS — E118 Type 2 diabetes mellitus with unspecified complications: Secondary | ICD-10-CM

## 2018-05-16 DIAGNOSIS — G5603 Carpal tunnel syndrome, bilateral upper limbs: Secondary | ICD-10-CM

## 2018-05-16 DIAGNOSIS — Z0000 Encounter for general adult medical examination without abnormal findings: Secondary | ICD-10-CM

## 2018-05-16 DIAGNOSIS — J301 Allergic rhinitis due to pollen: Secondary | ICD-10-CM | POA: Diagnosis not present

## 2018-05-16 DIAGNOSIS — E785 Hyperlipidemia, unspecified: Secondary | ICD-10-CM

## 2018-05-16 DIAGNOSIS — Z23 Encounter for immunization: Secondary | ICD-10-CM

## 2018-05-16 DIAGNOSIS — Z794 Long term (current) use of insulin: Secondary | ICD-10-CM | POA: Diagnosis not present

## 2018-05-16 LAB — BASIC METABOLIC PANEL
BUN: 25 mg/dL — ABNORMAL HIGH (ref 6–23)
CHLORIDE: 104 meq/L (ref 96–112)
CO2: 28 mEq/L (ref 19–32)
Calcium: 9.8 mg/dL (ref 8.4–10.5)
Creatinine, Ser: 1.06 mg/dL (ref 0.40–1.50)
GFR: 69.56 mL/min (ref >60.00)
GLUCOSE: 139 mg/dL — AB (ref 70–99)
Potassium: 4.3 mEq/L (ref 3.5–5.1)
Sodium: 140 mEq/L (ref 135–145)

## 2018-05-16 LAB — TSH: TSH: 6.9 u[IU]/mL — AB (ref 0.35–4.50)

## 2018-05-16 MED ORDER — EXENATIDE ER 2 MG/0.85ML ~~LOC~~ AUIJ: 1.0000 "pen " | AUTO-INJECTOR | SUBCUTANEOUS | 1 refills | Status: DC

## 2018-05-16 MED ORDER — AZELASTINE-FLUTICASONE 137-50 MCG/ACT NA SUSP: 2.0000 | Freq: Two times a day (BID) | NASAL | 11 refills | Status: DC

## 2018-05-16 MED ORDER — INSULIN PEN NEEDLE 31G X 8 MM MISC: 3 refills | Status: DC

## 2018-05-16 MED ORDER — ROSUVASTATIN CALCIUM 10 MG PO TABS: 10.0000 mg | ORAL_TABLET | Freq: Every day | ORAL | 1 refills | Status: DC

## 2018-05-16 MED ORDER — CANAGLIFLOZIN 100 MG PO TABS: 100.0000 mg | ORAL_TABLET | Freq: Every day | ORAL | 1 refills | Status: DC

## 2018-05-16 MED ORDER — BASAGLAR KWIKPEN 100 UNIT/ML ~~LOC~~ SOPN: PEN_INJECTOR | SUBCUTANEOUS | 5 refills | Status: DC

## 2018-05-16 MED ORDER — METFORMIN HCL ER 750 MG PO TB24: 1500.0000 mg | ORAL_TABLET | Freq: Every day | ORAL | 1 refills | Status: DC

## 2018-05-16 MED ORDER — TELMISARTAN 40 MG PO TABS: 40.0000 mg | ORAL_TABLET | Freq: Every day | ORAL | 1 refills | Status: DC

## 2018-05-16 NOTE — Patient Instructions (Signed)

## 2018-05-16 NOTE — Progress Notes (Signed)
Subjective:  Patient ID: Clifford Brown, male    DOB: 08-02-1950  Age: 68 y.o. MRN: [id number]  CC: Annual Exam and Hypertension   HPI Clifford Brown presents for a CPX.  He was recently diagnosed with moderately severe carpal tunnel syndrome.  I had recommended that he consider a surgical option but he is not ready to commit to that at this time.  He would rather try some occupational therapy for couple months and then consider in a few months.  Past Medical History:  Diagnosis Date  . Cancer [facility])    Prostate - Dr Alinda Money   . Depression   . Diabetes mellitus without complication ([place])   . Hyperlipidemia   . Hypertension    Past Surgical History:  Procedure Laterality Date  . PROSTATE SURGERY      reports that he has never smoked. He has never used smokeless tobacco. He reports current alcohol use of about 21.0 standard drinks of alcohol per week. He reports that he does not use drugs. family history includes Alcohol abuse in an other family member; Cancer in an other family member. No Known Allergies  Outpatient Medications Prior to Visit  Medication Sig Dispense Refill  . aspirin EC 81 MG tablet Take 1 tablet (81 mg total) by mouth daily. 90 tablet 3  . FREESTYLE LITE test strip USE AS DIRECTED 3 TIMES A DAY 300 each 3  . Azelastine-Fluticasone (DYMISTA) 137-50 MCG/ACT SUSP Place 2 puffs into the nose 2 (two) times daily. (Patient taking differently: Place 2 puffs into the nose as needed. ) 23 g 11  . B-D ULTRAFINE III SHORT PEN 31G X 8 MM MISC USE DAILY WITH TOUJEO 100 each 3  . BYDUREON BCISE 2 MG/0.85ML AUIJ INJECT 1 SYRINGE INTO THE SKIN ONCE A WEEK. 10.2 mL 1  . Insulin Glargine (BASAGLAR KWIKPEN) 100 UNIT/ML SOPN INJECT 30 UNITS INTO THE SKIN AT BEDTIME. 15 mL 5  . INVOKANA 100 MG TABS tablet TAKE 1 TABLET (100 MG TOTAL) BY MOUTH DAILY BEFORE BREAKFAST. 90 tablet 1  . metFORMIN (GLUCOPHAGE-XR) 750 MG 24 hr tablet Take 2 tablets (1,500 mg total) by mouth daily with  breakfast. 180 tablet 1  . rosuvastatin (CRESTOR) 10 MG tablet TAKE 1 TABLET BY MOUTH EVERY DAY 90 tablet 1  . telmisartan (MICARDIS) 40 MG tablet TAKE 1 TABLET BY MOUTH EVERY DAY 90 tablet 0   No facility-administered medications prior to visit.     ROS Review of Systems  Constitutional: Negative.  Negative for diaphoresis, fatigue and unexpected weight change.  HENT: Positive for congestion, postnasal drip and rhinorrhea. Negative for nosebleeds, sinus pressure, sinus pain, sneezing, sore throat and trouble swallowing.   Eyes: Negative for visual disturbance.  Respiratory: Negative for cough, chest tightness, shortness of breath and wheezing.   Gastrointestinal: Negative for abdominal pain, constipation, diarrhea, nausea and vomiting.  Endocrine: Negative for cold intolerance and heat intolerance.  Genitourinary: Negative.  Negative for difficulty urinating, penile swelling, scrotal swelling, testicular pain and urgency.  Musculoskeletal: Negative.  Negative for arthralgias and myalgias.  Skin: Negative.  Negative for color change, pallor and rash.  Neurological: Negative for dizziness, weakness, light-headedness and headaches.  Hematological: Negative for adenopathy. Does not bruise/bleed easily.  Psychiatric/Behavioral: Negative.     Objective:  BP 120/80 (BP Location: Left Arm, Patient Position: Sitting, Cuff Size: Normal)   Pulse 81   Temp 97.8 F (36.6 C) (Oral)   Ht 5\' 10"  (1.778 m)   Wt 186 lb 12  oz (84.7 kg)   SpO2 98%   BMI 26.80 kg/m   BP Readings from Last 3 Encounters:  05/16/18 120/80  04/10/18 130/82  10/23/17 136/78    Wt Readings from Last 3 Encounters:  05/16/18 186 lb 12 oz (84.7 kg)  04/10/18 188 lb 12 oz (85.6 kg)  10/23/17 192 lb (87.1 kg)    Physical Exam Vitals signs reviewed.  Constitutional:      Appearance: He is obese. He is not ill-appearing or diaphoretic.  HENT:     Nose: Nose normal. No congestion or rhinorrhea.     Mouth/Throat:      Pharynx: Oropharynx is clear. No oropharyngeal exudate or posterior oropharyngeal erythema.  Eyes:     General: No scleral icterus.    Conjunctiva/sclera: Conjunctivae normal.  Neck:     Musculoskeletal: Normal range of motion and neck supple. No muscular tenderness.  Cardiovascular:     Rate and Rhythm: Regular rhythm. Bradycardia present.     Heart sounds: No murmur. No gallop.   Pulmonary:     Effort: Pulmonary effort is normal.     Breath sounds: No stridor. No wheezing, rhonchi or rales.  Abdominal:     General: Bowel sounds are normal.     Palpations: There is no hepatomegaly, splenomegaly or mass.     Tenderness: There is no abdominal tenderness. There is no guarding.     Hernia: There is no hernia in the right inguinal area or left inguinal area.  Genitourinary:    Pubic Area: No rash.      Penis: Uncircumcised. No phimosis, paraphimosis, hypospadias, erythema, tenderness, discharge, swelling or lesions.      Scrotum/Testes: Normal.        Right: Mass not present.        Left: Mass not present.     Epididymis:     Right: Normal. No mass.     Left: Normal. No mass.     Prostate: Normal. Not enlarged, not tender and no nodules present.     Rectum: Guaiac result negative. External hemorrhoid and internal hemorrhoid present. No mass, tenderness or anal fissure. Normal anal tone.  Musculoskeletal: Normal range of motion.        General: No swelling.     Right lower leg: No edema.     Left lower leg: No edema.  Lymphadenopathy:     Cervical: No cervical adenopathy.     Lower Body: No right inguinal adenopathy. No left inguinal adenopathy.  Skin:    General: Skin is warm and dry.     Coloration: Skin is not pale.     Findings: No erythema or rash.  Neurological:     General: No focal deficit present.     Mental Status: He is oriented to person, place, and time. Mental status is at baseline.     Lab Results  Component Value Date   WBC 5.4 04/10/2018   HGB 15.8  04/10/2018   HCT 47.1 04/10/2018   PLT 192.0 04/10/2018   GLUCOSE 139 (H) 05/16/2018   CHOL 156 04/10/2018   TRIG 96.0 04/10/2018   HDL 65.70 04/10/2018   LDLDIRECT 183.6 01/31/2011   LDLCALC 71 04/10/2018   ALT 24 04/10/2018   AST 19 04/10/2018   NA 140 05/16/2018   K 4.3 05/16/2018   CL 104 05/16/2018   CREATININE 1.06 05/16/2018   BUN 25 (H) 05/16/2018   CO2 28 05/16/2018   TSH 6.90 (H) 05/16/2018   PSA 0.0 11/17/2017  HGBA1C 7.5 (H) 04/10/2018   MICROALBUR 2.0 (H) 04/10/2018    Dg Chest 2 View  Result Date: 04/10/2018 CLINICAL DATA:  Cough EXAM: CHEST - 2 VIEW COMPARISON:  12/03/2008 FINDINGS: Heart and mediastinal contours are within normal limits. No focal opacities or effusions. No acute bony abnormality. IMPRESSION: No active cardiopulmonary disease. Electronically Signed   By: Rolm Baptise M.D.   On: 04/10/2018 17:15    Assessment & Plan:   Clifford Brown was seen today for annual exam and hypertension.  Diagnoses and all orders for this visit:  Essential hypertension, benign- His blood pressure is adequately well controlled.  Electrolytes and renal function are normal.  Will continue the ARB at the current dose. -     Basic metabolic panel; Future -     telmisartan (MICARDIS) 40 MG tablet; Take 1 tablet (40 mg total) by mouth daily.  Need for pneumococcal vaccination -     Pneumococcal polysaccharide vaccine 23-valent greater than or equal to 2yo subcutaneous/IM  Routine general medical examination at a health care facility  Carpal tunnel syndrome on both sides -     Ambulatory referral to Occupational Therapy  Acquired hypothyroidism- His TSH is stable but remains mildly elevated.  Clinically he appears euthyroid.  I therefore do not recommend a thyroid supplement at this time. -     TSH; Future  Allergic rhinitis due to pollen -     Azelastine-Fluticasone (DYMISTA) 137-50 MCG/ACT SUSP; Place 2 puffs into the nose 2 (two) times daily.  Type 2 diabetes mellitus  with complication, without long-term current use of insulin (HCC) -     telmisartan (MICARDIS) 40 MG tablet; Take 1 tablet (40 mg total) by mouth daily. -     metFORMIN (GLUCOPHAGE-XR) 750 MG 24 hr tablet; Take 2 tablets (1,500 mg total) by mouth daily with breakfast. -     canagliflozin (INVOKANA) 100 MG TABS tablet; Take 1 tablet (100 mg total) by mouth daily before breakfast. -     Insulin Glargine (BASAGLAR KWIKPEN) 100 UNIT/ML SOPN; INJECT 30 UNITS INTO THE SKIN AT BEDTIME. -     Insulin Pen Needle (B-D ULTRAFINE III SHORT PEN) 31G X 8 MM MISC; USE DAILY WITH insulin -     Exenatide ER (BYDUREON BCISE) 2 MG/0.85ML AUIJ; Inject 1 pen as directed once a week.  Type II diabetes mellitus with manifestations (HCC) -     telmisartan (MICARDIS) 40 MG tablet; Take 1 tablet (40 mg total) by mouth daily. -     metFORMIN (GLUCOPHAGE-XR) 750 MG 24 hr tablet; Take 2 tablets (1,500 mg total) by mouth daily with breakfast. -     canagliflozin (INVOKANA) 100 MG TABS tablet; Take 1 tablet (100 mg total) by mouth daily before breakfast. -     Insulin Glargine (BASAGLAR KWIKPEN) 100 UNIT/ML SOPN; INJECT 30 UNITS INTO THE SKIN AT BEDTIME. -     Insulin Pen Needle (B-D ULTRAFINE III SHORT PEN) 31G X 8 MM MISC; USE DAILY WITH insulin -     Exenatide ER (BYDUREON BCISE) 2 MG/0.85ML AUIJ; Inject 1 pen as directed once a week.  Type 2 diabetes mellitus with complication, with long-term current use of insulin (HCC) -     telmisartan (MICARDIS) 40 MG tablet; Take 1 tablet (40 mg total) by mouth daily. -     metFORMIN (GLUCOPHAGE-XR) 750 MG 24 hr tablet; Take 2 tablets (1,500 mg total) by mouth daily with breakfast. -     canagliflozin (INVOKANA) 100 MG TABS  tablet; Take 1 tablet (100 mg total) by mouth daily before breakfast. -     Insulin Glargine (BASAGLAR KWIKPEN) 100 UNIT/ML SOPN; INJECT 30 UNITS INTO THE SKIN AT BEDTIME. -     Insulin Pen Needle (B-D ULTRAFINE III SHORT PEN) 31G X 8 MM MISC; USE DAILY WITH  insulin -     Exenatide ER (BYDUREON BCISE) 2 MG/0.85ML AUIJ; Inject 1 pen as directed once a week.  Dyslipidemia (high LDL; low HDL) -     rosuvastatin (CRESTOR) 10 MG tablet; Take 1 tablet (10 mg total) by mouth daily.   I have changed Clifford Brown's Invokana to canagliflozin, B-D ULTRAFINE III SHORT PEN to Insulin Pen Needle, and Bydureon BCise to Exenatide ER. I have also changed his telmisartan and rosuvastatin. I am also having him maintain his aspirin EC, FREESTYLE LITE, Azelastine-Fluticasone, metFORMIN, and Basaglar KwikPen.  Meds ordered this encounter  Medications  . Azelastine-Fluticasone (DYMISTA) 137-50 MCG/ACT SUSP    Sig: Place 2 puffs into the nose 2 (two) times daily.    Dispense:  23 g    Refill:  11  . telmisartan (MICARDIS) 40 MG tablet    Sig: Take 1 tablet (40 mg total) by mouth daily.    Dispense:  90 tablet    Refill:  1  . rosuvastatin (CRESTOR) 10 MG tablet    Sig: Take 1 tablet (10 mg total) by mouth daily.    Dispense:  90 tablet    Refill:  1  . metFORMIN (GLUCOPHAGE-XR) 750 MG 24 hr tablet    Sig: Take 2 tablets (1,500 mg total) by mouth daily with breakfast.    Dispense:  180 tablet    Refill:  1  . canagliflozin (INVOKANA) 100 MG TABS tablet    Sig: Take 1 tablet (100 mg total) by mouth daily before breakfast.    Dispense:  90 tablet    Refill:  1  . Insulin Glargine (BASAGLAR KWIKPEN) 100 UNIT/ML SOPN    Sig: INJECT 30 UNITS INTO THE SKIN AT BEDTIME.    Dispense:  15 mL    Refill:  5  . Insulin Pen Needle (B-D ULTRAFINE III SHORT PEN) 31G X 8 MM MISC    Sig: USE DAILY WITH insulin    Dispense:  100 each    Refill:  3  . Exenatide ER (BYDUREON BCISE) 2 MG/0.85ML AUIJ    Sig: Inject 1 pen as directed once a week.    Dispense:  10.2 mL    Refill:  1   See AVS for instructions about healthy living and anticipatory guidance.  Follow-up: Return in about 4 months (around 09/15/2018).  [name], MD

## 2018-05-18 NOTE — Assessment & Plan Note (Signed)

## 2018-05-21 ENCOUNTER — Encounter: Payer: Self-pay | Admitting: Internal Medicine

## 2018-05-31 DIAGNOSIS — R200 Anesthesia of skin: Secondary | ICD-10-CM | POA: Diagnosis not present

## 2018-06-28 DIAGNOSIS — R200 Anesthesia of skin: Secondary | ICD-10-CM | POA: Diagnosis not present

## 2018-08-07 DIAGNOSIS — E119 Type 2 diabetes mellitus without complications: Secondary | ICD-10-CM | POA: Diagnosis not present

## 2018-08-07 LAB — HM DIABETES EYE EXAM

## 2018-08-14 ENCOUNTER — Other Ambulatory Visit: Payer: Self-pay | Admitting: Internal Medicine

## 2018-08-14 ENCOUNTER — Telehealth: Payer: Self-pay | Admitting: Internal Medicine

## 2018-08-14 DIAGNOSIS — E118 Type 2 diabetes mellitus with unspecified complications: Secondary | ICD-10-CM

## 2018-08-14 MED ORDER — INSULIN GLARGINE (1 UNIT DIAL) 300 UNIT/ML ~~LOC~~ SOPN: 30.0000 [IU] | PEN_INJECTOR | Freq: Every day | SUBCUTANEOUS | 1 refills | Status: DC

## 2018-08-14 NOTE — Telephone Encounter (Signed)
Copied from [place] [id number]. Topic: General - Other >> Aug 14, 2018 10:07 AM Jodie Echevaria wrote: Reason for CRM: Patient called to say that his insurance will no longer cover the Insulin Glargine (BASAGLAR KWIKPEN) 100 UNIT/ML SOPN but will cover either Lantus or Toujeo as of 09/12/2018 so Dr [name] can decide which to switch to. Please be advised. Ph# [phone]

## 2018-08-15 ENCOUNTER — Other Ambulatory Visit: Payer: Self-pay | Admitting: Internal Medicine

## 2018-08-15 DIAGNOSIS — E118 Type 2 diabetes mellitus with unspecified complications: Secondary | ICD-10-CM

## 2018-08-15 MED ORDER — INSULIN GLARGINE 100 UNIT/ML SOLOSTAR PEN: 30.0000 [IU] | PEN_INJECTOR | Freq: Every day | SUBCUTANEOUS | 2 refills | Status: DC

## 2018-08-15 NOTE — Telephone Encounter (Signed)
Contacted pharmacy and pt has a new insurance that starts on July 1st. Pt has enough basaglar to get him to July 1st. They have the Lantus on file and will start filling the Lantus as of July 1. There is nothing that needs to be done at this time.

## 2018-08-15 NOTE — Telephone Encounter (Signed)
Fax received stating that the toujeo is not covered and that the basaglar is preferred.

## 2018-10-29 ENCOUNTER — Ambulatory Visit: Payer: Medicare Other

## 2018-11-13 ENCOUNTER — Other Ambulatory Visit: Payer: Self-pay | Admitting: Internal Medicine

## 2018-11-13 DIAGNOSIS — E118 Type 2 diabetes mellitus with unspecified complications: Secondary | ICD-10-CM

## 2018-11-20 NOTE — Progress Notes (Addendum)
Subjective:   Clifford Brown is a 68 y.o. male who presents for Medicare Annual/Subsequent preventive examination.  Review of Systems:   Cardiac Risk Factors include: advanced age (>13men, >22 women);diabetes mellitus;dyslipidemia;hypertension;male gender Sleep patterns: feels rested on waking, does not get up to void, gets up 1-2 times nightly to void and sleeps 4-5 hours nightly. Patient reports insomnia issues, discussed recommended sleep tips.   Home Safety/Smoke Alarms: Feels safe in home. Smoke alarms in place.  Living environment; residence and Firearm Safety: 1-story house/ trailer. Lives with wife, no needs for DME, good support system Seat Belt Safety/Bike Helmet: Wears seat belt.   PSA-  Lab Results  Component Value Date   PSA 0.0 11/17/2017   PSA 0.015 11/18/2016   PSA 0.00 (L) 05/02/2016       Objective:    Vitals: BP 138/85   Pulse 90   Resp 17   Ht 5\' 10"  (1.778 m)   Wt 185 lb (83.9 kg)   SpO2 99%   BMI 26.54 kg/m   Body mass index is 26.54 kg/m.  Advanced Directives 11/21/2018 10/23/2017 05/02/2016 09/29/2014  Does Patient Have a Medical Advance Directive? No No Yes No  Type of Advance Directive - Public librarian;Living will -  Copy of [place] in Chart? - - Yes -  Would patient like information on creating a medical advance directive? No - Patient declined Yes (ED - Information included in AVS) - Yes - Educational materials given    Tobacco Social History   Tobacco Use  Smoking Status Never Smoker  Smokeless Tobacco Never Used     Counseling given: Not Answered  Past Medical History:  Diagnosis Date  . Cancer [facility])    Prostate - Dr Alinda Money   . Depression   . Diabetes mellitus without complication ([place])   . Hyperlipidemia   . Hypertension    Past Surgical History:  Procedure Laterality Date  . PROSTATE SURGERY     Family History  Problem Relation Age of Onset  . Alcohol abuse Other   . Cancer Other     Prostate  . Stroke Neg Hx   . Kidney disease Neg Hx   . Hypertension Neg Hx   . Hyperlipidemia Neg Hx   . Heart disease Neg Hx   . Early death Neg Hx   . Diabetes Neg Hx    Social History   Socioeconomic History  . Marital status: Married    Spouse name: Not on file  . Number of children: 2  . Years of education: Not on file  . Highest education level: Not on file  Occupational History  . Occupation: [employer] part-time  Social Needs  . Financial resource strain: Not hard at all  . Food insecurity    Worry: Never true    Inability: Never true  . Transportation needs    Medical: No    Non-medical: No  Tobacco Use  . Smoking status: Never Smoker  . Smokeless tobacco: Never Used  Substance and Sexual Activity  . Alcohol use: Yes    Alcohol/week: 21.0 standard drinks    Types: 21 Cans of beer per week    Comment: couple beers on day off  . Drug use: No  . Sexual activity: Yes  Lifestyle  . Physical activity    Days per week: 3 days    Minutes per session: 50 min  . Stress: Not at all  Relationships  . Social connections  Talks on phone: More than three times a week    Gets together: More than three times a week    Attends religious service: Not on file    Active member of club or organization: Yes    Attends meetings of clubs or organizations: More than 4 times per year    Relationship status: Married  Other Topics Concern  . Not on file  Social History Narrative  . Not on file    Outpatient Encounter Medications as of 11/21/2018  Medication Sig  . aspirin EC 81 MG tablet Take 1 tablet (81 mg total) by mouth daily.  . Azelastine-Fluticasone (DYMISTA) 137-50 MCG/ACT SUSP Place 2 puffs into the nose 2 (two) times daily.  Marland Kitchen BYDUREON BCISE 2 MG/0.85ML AUIJ INJECT 1 PEN AS DIRECTED ONCE A WEEK.  . canagliflozin (INVOKANA) 100 MG TABS tablet Take 1 tablet (100 mg total) by mouth daily before breakfast.  . FREESTYLE LITE test strip USE AS DIRECTED 3 TIMES A DAY  .  Insulin Glargine (LANTUS SOLOSTAR) 100 UNIT/ML Solostar Pen Inject 30 Units into the skin daily.  . Insulin Pen Needle (B-D ULTRAFINE III SHORT PEN) 31G X 8 MM MISC USE DAILY WITH insulin  . metFORMIN (GLUCOPHAGE-XR) 750 MG 24 hr tablet Take 2 tablets (1,500 mg total) by mouth daily with breakfast.  . Multiple Vitamins-Minerals (MULTI COMPLETE PO) Take 1 tablet by mouth daily.  . rosuvastatin (CRESTOR) 10 MG tablet Take 1 tablet (10 mg total) by mouth daily.  Marland Kitchen telmisartan (MICARDIS) 40 MG tablet Take 1 tablet (40 mg total) by mouth daily.   No facility-administered encounter medications on file as of 11/21/2018.     Activities of Daily Living In your present state of health, do you have any difficulty performing the following activities: 11/21/2018 05/18/2018  Hearing? N N  Vision? N N  Difficulty concentrating or making decisions? N Y  Walking or climbing stairs? N N  Dressing or bathing? N N  Doing errands, shopping? N N  Preparing Food and eating ? N -  Using the Toilet? N -  In the past six months, have you accidently leaked urine? N -  Do you have problems with loss of bowel control? N -  Managing your Medications? N -  Managing your Finances? N -  Housekeeping or managing your Housekeeping? N -  Some recent data might be hidden    Patient Care Team: [name], MD as PCP - General   Assessment:   This is a routine wellness examination for Clifford Brown. Physical assessment deferred to PCP.  Exercise Activities and Dietary recommendations Current Exercise Habits: Home exercise routine;The patient has a physically strenuous job, but has no regular exercise apart from work., Type of exercise: walking, Time (Minutes): 50, Frequency (Times/Week): 3, Weekly Exercise (Minutes/Week): 150, Intensity: Mild  Diet (meal preparation, eat out, water intake, caffeinated beverages, dairy products, fruits and vegetables): in general, a "healthy" diet  , well balanced   Reviewed heart healthy and  diabetic diet. Encouraged patient to increase daily water and healthy fluid intake.  Goals    . Patient Stated     Continue to lose weight by getting back to going to [employer], ride my bike in the neighborhood, walk the dogs, and watching my diet for carbohydrates.       Fall Risk Fall Risk  11/21/2018 05/18/2018 04/10/2018 10/23/2017 10/12/2017  Falls in the past year? 0 0 0 No No  Comment - - - - Emmi Telephone  Survey: data to providers prior to load  Number falls in past yr: 0 - 0 - -  Injury with Fall? 0 - 0 - -  Follow up - - Falls evaluation completed - -    Depression Screen PHQ 2/9 Scores 11/21/2018 04/10/2018 10/23/2017 05/02/2016  PHQ - 2 Score 1 0 0 0  PHQ- 9 Score 3 0 1 -    Cognitive Function       Ad8 score reviewed for issues:  Issues making decisions: no  Less interest in hobbies / activities: no  Repeats questions, stories (family complaining): no  Trouble using ordinary gadgets (microwave, computer, phone):no  Forgets the month or year: no  Mismanaging finances: no  Remembering appts: no  Daily problems with thinking and/or memory: no Ad8 score is= 0  Immunization History  Administered Date(s) Administered  . Fluad Quad(high Dose 65+) 11/21/2018  . Influenza Whole 12/28/2011, 01/03/2013  . Influenza, High Dose Seasonal PF 02/08/2017, 12/04/2017  . Influenza,inj,Quad PF,6+ Mos 11/25/2014  . Pneumococcal Conjugate-13 08/21/2014  . Pneumococcal Polysaccharide-23 01/08/2013, 05/16/2018  . Td 01/12/2010  . Zoster 08/21/2014   Screening Tests Health Maintenance  Topic Date Due  . HEMOGLOBIN A1C  10/09/2018  . OPHTHALMOLOGY EXAM  08/07/2019  . FOOT EXAM  11/21/2019  . TETANUS/TDAP  01/13/2020  . Fecal DNA (Cologuard)  04/23/2021  . INFLUENZA VACCINE  Completed  . Hepatitis C Screening  Completed  . PNA vac Low Risk Adult  Completed      Plan:    Reviewed health maintenance screenings with patient today and relevant education, vaccines,  and/or referrals were provided.   I have personally reviewed and noted the following in the patient's chart:   . Medical and social history . Use of alcohol, tobacco or illicit drugs  . Current medications and supplements . Functional ability and status . Nutritional status . Physical activity . Advanced directives . List of other physicians . Vitals . Screenings to include cognitive, depression, and falls . Referrals and appointments  In addition, I have reviewed and discussed with patient certain preventive protocols, quality metrics, and best practice recommendations. A written personalized care plan for preventive services as well as general preventive health recommendations were provided to patient.     Michiel Cowboy, RN  11/21/2018  Medical screening examination/treatment/procedure(s) were performed by non-physician practitioner and as supervising physician I was immediately available for consultation/collaboration. I agree with above. [name], MD

## 2018-11-21 ENCOUNTER — Other Ambulatory Visit: Payer: Self-pay

## 2018-11-21 ENCOUNTER — Ambulatory Visit (INDEPENDENT_AMBULATORY_CARE_PROVIDER_SITE_OTHER): Payer: Medicare Other | Admitting: *Deleted

## 2018-11-21 VITALS — BP 138/85 | HR 90 | Resp 17 | Ht 70.0 in | Wt 185.0 lb

## 2018-11-21 DIAGNOSIS — Z23 Encounter for immunization: Secondary | ICD-10-CM | POA: Diagnosis not present

## 2018-11-21 DIAGNOSIS — E118 Type 2 diabetes mellitus with unspecified complications: Secondary | ICD-10-CM

## 2018-11-21 DIAGNOSIS — Z0000 Encounter for general adult medical examination without abnormal findings: Secondary | ICD-10-CM | POA: Diagnosis not present

## 2018-11-21 DIAGNOSIS — Z8546 Personal history of malignant neoplasm of prostate: Secondary | ICD-10-CM | POA: Diagnosis not present

## 2018-11-21 NOTE — Patient Instructions (Addendum)
Continue doing brain stimulating activities (puzzles, reading, adult coloring books, staying active) to keep memory sharp.   Continue to eat heart healthy diet (full of fruits, vegetables, whole grains, lean protein, water--limit salt, fat, and sugar intake) and increase physical activity as tolerated.   Clifford Brown , Thank you for taking time to come for your Medicare Wellness Visit. I appreciate your ongoing commitment to your health goals. Please review the following plan we discussed and let me know if I can assist you in the future.   These are the goals we discussed: Goals    . Patient Stated     Continue to lose weight by getting back to going to [employer], ride my bike in the neighborhood, walk the dogs, and watching my diet for carbohydrates.       This is a list of the screening recommended for you and due dates:  Health Maintenance  Topic Date Due  . Complete foot exam   09/21/2018  . Hemoglobin A1C  10/09/2018  . Flu Shot  10/13/2018  . Eye exam for diabetics  08/07/2019  . Tetanus Vaccine  01/13/2020  . Cologuard (Stool DNA test)  04/23/2021  .  Hepatitis C: One time screening is recommended by Center for Disease Control  (CDC) for  adults born from [birth year] through 1965.   Completed  . Pneumonia vaccines  Completed    Preventive Care 53 Years and Older, Male Preventive care refers to lifestyle choices and visits with your health care provider that can promote health and wellness. This includes:  A yearly physical exam. This is also called an annual well check.  Regular dental and eye exams.  Immunizations.  Screening for certain conditions.  Healthy lifestyle choices, such as diet and exercise. What can I expect for my preventive care visit? Physical exam Your health care provider will check:  Height and weight. These may be used to calculate body mass index (BMI), which is a measurement that tells if you are at a healthy weight.  Heart rate and blood  pressure.  Your skin for abnormal spots. Counseling Your health care provider may ask you questions about:  Alcohol, tobacco, and drug use.  Emotional well-being.  Home and relationship well-being.  Sexual activity.  Eating habits.  History of falls.  Memory and ability to understand (cognition).  Work and work Statistician. What immunizations do I need?  Influenza (flu) vaccine  This is recommended every year. Tetanus, diphtheria, and pertussis (Tdap) vaccine  You may need a Td booster every 10 years. Varicella (chickenpox) vaccine  You may need this vaccine if you have not already been vaccinated. Zoster (shingles) vaccine  You may need this after age 43. Pneumococcal conjugate (PCV13) vaccine  One dose is recommended after age 21. Pneumococcal polysaccharide (PPSV23) vaccine  One dose is recommended after age 75. Measles, mumps, and rubella (MMR) vaccine  You may need at least one dose of MMR if you were born in 1957 or later. You may also need a second dose. Meningococcal conjugate (MenACWY) vaccine  You may need this if you have certain conditions. Hepatitis A vaccine  You may need this if you have certain conditions or if you travel or work in places where you may be exposed to hepatitis A. Hepatitis B vaccine  You may need this if you have certain conditions or if you travel or work in places where you may be exposed to hepatitis B. Haemophilus influenzae type b (Hib) vaccine  You may  need this if you have certain conditions. You may receive vaccines as individual doses or as more than one vaccine together in one shot (combination vaccines). Talk with your health care provider about the risks and benefits of combination vaccines. What tests do I need? Blood tests  Lipid and cholesterol levels. These may be checked every 5 years, or more frequently depending on your overall health.  Hepatitis C test.  Hepatitis B test. Screening  Lung cancer  screening. You may have this screening every year starting at age 2 if you have a 30-pack-year history of smoking and currently smoke or have quit within the past 15 years.  Colorectal cancer screening. All adults should have this screening starting at age 32 and continuing until age 72. Your health care provider may recommend screening at age 64 if you are at increased risk. You will have tests every 1-10 years, depending on your results and the type of screening test.  Prostate cancer screening. Recommendations will vary depending on your family history and other risks.  Diabetes screening. This is done by checking your blood sugar (glucose) after you have not eaten for a while (fasting). You may have this done every 1-3 years.  Abdominal aortic aneurysm (AAA) screening. You may need this if you are a current or former smoker.  Sexually transmitted disease (STD) testing. Follow these instructions at home: Eating and drinking  Eat a diet that includes fresh fruits and vegetables, whole grains, lean protein, and low-fat dairy products. Limit your intake of foods with high amounts of sugar, saturated fats, and salt.  Take vitamin and mineral supplements as recommended by your health care provider.  Do not drink alcohol if your health care provider tells you not to drink.  If you drink alcohol: ? Limit how much you have to 0-2 drinks a day. ? Be aware of how much alcohol is in your drink. In the [place] one drink equals one 12 oz bottle of beer (355 mL), one 5 oz glass of wine (148 mL), or one 1 oz glass of hard liquor (44 mL). Lifestyle  Take daily care of your teeth and gums.  Stay active. Exercise for at least 30 minutes on 5 or more days each week.  Do not use any products that contain nicotine or tobacco, such as cigarettes, e-cigarettes, and chewing tobacco. If you need help quitting, ask your health care provider.  If you are sexually active, practice safe sex. Use a condom or  other form of protection to prevent STIs (sexually transmitted infections).  Talk with your health care provider about taking a low-dose aspirin or statin. What's next?  Visit your health care provider once a year for a well check visit.  Ask your health care provider how often you should have your eyes and teeth checked.  Stay up to date on all vaccines. This information is not intended to replace advice given to you by your health care provider. Make sure you discuss any questions you have with your health care provider. Document Released: 03/27/2015 Document Revised: 02/22/2018 Document Reviewed: 02/22/2018 Elsevier Patient Education  2020 [employer].

## 2018-11-23 LAB — PSA: PSA: 0.015

## 2018-11-28 ENCOUNTER — Encounter: Payer: Self-pay | Admitting: Internal Medicine

## 2018-11-28 ENCOUNTER — Other Ambulatory Visit: Payer: Self-pay | Admitting: Internal Medicine

## 2018-11-28 DIAGNOSIS — E118 Type 2 diabetes mellitus with unspecified complications: Secondary | ICD-10-CM

## 2018-11-28 DIAGNOSIS — N5201 Erectile dysfunction due to arterial insufficiency: Secondary | ICD-10-CM | POA: Diagnosis not present

## 2018-11-28 DIAGNOSIS — Z8546 Personal history of malignant neoplasm of prostate: Secondary | ICD-10-CM | POA: Diagnosis not present

## 2018-11-28 MED ORDER — JARDIANCE 25 MG PO TABS: 25.0000 mg | ORAL_TABLET | Freq: Every day | ORAL | 0 refills | Status: DC

## 2018-12-05 ENCOUNTER — Ambulatory Visit (INDEPENDENT_AMBULATORY_CARE_PROVIDER_SITE_OTHER): Payer: Medicare Other | Admitting: Internal Medicine

## 2018-12-05 ENCOUNTER — Encounter: Payer: Self-pay | Admitting: Internal Medicine

## 2018-12-05 ENCOUNTER — Other Ambulatory Visit (INDEPENDENT_AMBULATORY_CARE_PROVIDER_SITE_OTHER): Payer: Medicare Other

## 2018-12-05 ENCOUNTER — Other Ambulatory Visit: Payer: Self-pay

## 2018-12-05 VITALS — BP 120/80 | HR 92 | Temp 98.5°F | Resp 16 | Ht 70.0 in | Wt 184.0 lb

## 2018-12-05 DIAGNOSIS — I10 Essential (primary) hypertension: Secondary | ICD-10-CM

## 2018-12-05 DIAGNOSIS — E039 Hypothyroidism, unspecified: Secondary | ICD-10-CM

## 2018-12-05 DIAGNOSIS — E118 Type 2 diabetes mellitus with unspecified complications: Secondary | ICD-10-CM

## 2018-12-05 LAB — HEMOGLOBIN A1C: Hgb A1c MFr Bld: 7.1 % — ABNORMAL HIGH (ref 4.6–6.5)

## 2018-12-05 LAB — BASIC METABOLIC PANEL
BUN: 20 mg/dL (ref 6–23)
CO2: 26 mEq/L (ref 19–32)
Calcium: 9.7 mg/dL (ref 8.4–10.5)
Chloride: 103 mEq/L (ref 96–112)
Creatinine, Ser: 1 mg/dL (ref 0.40–1.50)
GFR: 74.27 mL/min (ref >60.00)
Glucose, Bld: 78 mg/dL (ref 70–99)
Potassium: 4.3 mEq/L (ref 3.5–5.1)
Sodium: 138 mEq/L (ref 135–145)

## 2018-12-05 LAB — TSH: TSH: 6.44 u[IU]/mL — ABNORMAL HIGH (ref 0.35–4.50)

## 2018-12-05 NOTE — Patient Instructions (Signed)
Type 2 Diabetes Mellitus, Diagnosis, Adult Type 2 diabetes (type 2 diabetes mellitus) is a long-term (chronic) disease. In type 2 diabetes, one or both of these problems may be present:  The pancreas does not make enough of a hormone called insulin.  Cells in the body do not respond properly to insulin that the body makes (insulin resistance). Normally, insulin allows blood sugar (glucose) to enter cells in the body. The cells use glucose for energy. Insulin resistance or lack of insulin causes excess glucose to build up in the blood instead of going into cells. As a result, high blood glucose (hyperglycemia) develops. What increases the risk? The following factors may make you more likely to develop type 2 diabetes:  Having a family member with type 2 diabetes.  Being overweight or obese.  Having an inactive (sedentary) lifestyle.  Having been diagnosed with insulin resistance.  Having a history of prediabetes, gestational diabetes, or polycystic ovary syndrome (PCOS).  Being of American-Indian, African-American, Hispanic/Latino, or Asian/Pacific Islander descent. What are the signs or symptoms? In the early stage of this condition, you may not have symptoms. Symptoms develop slowly and may include:  Increased thirst (polydipsia).  Increased hunger(polyphagia).  Increased urination (polyuria).  Increased urination during the night (nocturia).  Unexplained weight loss.  Frequent infections that keep coming back (recurring).  Fatigue.  Weakness.  Vision changes, such as blurry vision.  Cuts or bruises that are slow to heal.  Tingling or numbness in the hands or feet.  Dark patches on the skin (acanthosis nigricans). How is this diagnosed? This condition is diagnosed based on your symptoms, your medical history, a physical exam, and your blood glucose level. Your blood glucose may be checked with one or more of the following blood tests:  A fasting blood glucose (FBG)  test. You will not be allowed to eat (you will fast) for 8 hours or longer before a blood sample is taken.  A random blood glucose test. This test checks blood glucose at any time of day regardless of when you ate.  An A1c (hemoglobin A1c) blood test. This test provides information about blood glucose control over the previous 2-3 months.  An oral glucose tolerance test (OGTT). This test measures your blood glucose at two times: ? After fasting. This is your baseline blood glucose level. ? Two hours after drinking a beverage that contains glucose. You may be diagnosed with type 2 diabetes if:  Your FBG level is 126 mg/dL (7.0 mmol/L) or higher.  Your random blood glucose level is 200 mg/dL (11.1 mmol/L) or higher.  Your A1c level is 6.5% or higher.  Your OGTT result is higher than 200 mg/dL (11.1 mmol/L). These blood tests may be repeated to confirm your diagnosis. How is this treated? Your treatment may be managed by a specialist called an endocrinologist. Type 2 diabetes may be treated by following instructions from your health care provider about:  Making diet and lifestyle changes. This may include: ? Following an individualized nutrition plan that is developed by a diet and nutrition specialist (registered dietitian). ? Exercising regularly. ? Finding ways to manage stress.  Checking your blood glucose level as often as told.  Taking diabetes medicines or insulin daily. This helps to keep your blood glucose levels in the healthy range. ? If you use insulin, you may need to adjust the dosage depending on how physically active you are and what foods you eat. Your health care provider will tell you how to adjust your dosage.    Taking medicines to help prevent complications from diabetes, such as: ? Aspirin. ? Medicine to lower cholesterol. ? Medicine to control blood pressure. Your health care provider will set individualized treatment goals for you. Your goals will be based on  your age, other medical conditions you have, and how you respond to diabetes treatment. Generally, the goal of treatment is to maintain the following blood glucose levels:  Before meals (preprandial): 80-130 mg/dL (4.4-7.2 mmol/L).  After meals (postprandial): below 180 mg/dL (10 mmol/L).  A1c level: less than 7%. Follow these instructions at home: Questions to ask your health care provider  Consider asking the following questions: ? Do I need to meet with a diabetes educator? ? Where can I find a support group for people with diabetes? ? What equipment will I need to manage my diabetes at home? ? What diabetes medicines do I need, and when should I take them? ? How often do I need to check my blood glucose? ? What number can I call if I have questions? ? When is my next appointment? General instructions  Take over-the-counter and prescription medicines only as told by your health care provider.  Keep all follow-up visits as told by your health care provider. This is important.  For more information about diabetes, visit: ? American Diabetes Association (ADA): www.diabetes.org ? American Association of Diabetes Educators (AADE): www.diabeteseducator.org Contact a health care provider if:  Your blood glucose is at or above 240 mg/dL (13.3 mmol/L) for 2 days in a row.  You have been sick or have had a fever for 2 days or longer, and you are not getting better.  You have any of the following problems for more than 6 hours: ? You cannot eat or drink. ? You have nausea and vomiting. ? You have diarrhea. Get help right away if:  Your blood glucose is lower than 54 mg/dL (3.0 mmol/L).  You become confused or you have trouble thinking clearly.  You have difficulty breathing.  You have moderate or large ketone levels in your urine. Summary  Type 2 diabetes (type 2 diabetes mellitus) is a long-term (chronic) disease. In type 2 diabetes, the pancreas does not make enough of a  hormone called insulin, or cells in the body do not respond properly to insulin that the body makes (insulin resistance).  This condition is treated by making diet and lifestyle changes and taking diabetes medicines or insulin.  Your health care provider will set individualized treatment goals for you. Your goals will be based on your age, other medical conditions you have, and how you respond to diabetes treatment.  Keep all follow-up visits as told by your health care provider. This is important. This information is not intended to replace advice given to you by your health care provider. Make sure you discuss any questions you have with your health care provider. Document Released: 02/28/2005 Document Revised: 04/28/2017 Document Reviewed: 04/03/2015 Elsevier Patient Education  2020 Elsevier Inc.  

## 2018-12-05 NOTE — Progress Notes (Signed)
Subjective:  Patient ID: Clifford Brown, male    DOB: 05-24-1950  Age: 68 y.o. MRN: [id number]  CC: Hypertension, Diabetes, and Hypothyroidism   HPI [place] Buscaglia presents for f/up - He had a brief episode of fatigue about 3 weeks ago but he tells me the fatigue has resolved.  He has felt well recently and offers no complaints.  Outpatient Medications Prior to Visit  Medication Sig Dispense Refill  . aspirin EC 81 MG tablet Take 1 tablet (81 mg total) by mouth daily. 90 tablet 3  . Azelastine-Fluticasone (DYMISTA) 137-50 MCG/ACT SUSP Place 2 puffs into the nose 2 (two) times daily. 23 g 11  . BYDUREON BCISE 2 MG/0.85ML AUIJ INJECT 1 PEN AS DIRECTED ONCE A WEEK. 10.2 mL 1  . empagliflozin (JARDIANCE) 25 MG TABS tablet Take 25 mg by mouth daily. 90 tablet 0  . FREESTYLE LITE test strip USE AS DIRECTED 3 TIMES A DAY 300 each 3  . Insulin Glargine (LANTUS SOLOSTAR) 100 UNIT/ML Solostar Pen Inject 30 Units into the skin daily. 5 pen 2  . Insulin Pen Needle (B-D ULTRAFINE III SHORT PEN) 31G X 8 MM MISC USE DAILY WITH insulin 100 each 3  . metFORMIN (GLUCOPHAGE-XR) 750 MG 24 hr tablet Take 2 tablets (1,500 mg total) by mouth daily with breakfast. 180 tablet 1  . Multiple Vitamins-Minerals (MULTI COMPLETE PO) Take 1 tablet by mouth daily.    . rosuvastatin (CRESTOR) 10 MG tablet Take 1 tablet (10 mg total) by mouth daily. 90 tablet 1  . telmisartan (MICARDIS) 40 MG tablet Take 1 tablet (40 mg total) by mouth daily. 90 tablet 1   No facility-administered medications prior to visit.     ROS Review of Systems  Constitutional: Negative for diaphoresis, fatigue and unexpected weight change.  HENT: Negative.   Eyes: Negative for visual disturbance.  Respiratory: Negative for chest tightness, shortness of breath and wheezing.   Cardiovascular: Negative for chest pain, palpitations and leg swelling.  Gastrointestinal: Negative for abdominal pain, constipation, diarrhea, nausea and vomiting.   Endocrine: Negative.  Negative for cold intolerance and heat intolerance.  Genitourinary: Negative.  Negative for difficulty urinating.  Musculoskeletal: Negative.  Negative for arthralgias and myalgias.  Skin: Negative for color change and pallor.  Neurological: Negative.  Negative for dizziness, weakness and headaches.  Hematological: Negative for adenopathy. Does not bruise/bleed easily.  Psychiatric/Behavioral: Negative.     Objective:  BP 120/80 (BP Location: Left Arm, Patient Position: Sitting, Cuff Size: Normal)   Pulse 92   Temp 98.5 F (36.9 C) (Oral)   Resp 16   Ht 5\' 10"  (1.778 m)   Wt 184 lb (83.5 kg)   SpO2 99%   BMI 26.40 kg/m   BP Readings from Last 3 Encounters:  12/05/18 120/80  11/21/18 138/85  05/16/18 120/80    Wt Readings from Last 3 Encounters:  12/05/18 184 lb (83.5 kg)  11/21/18 185 lb (83.9 kg)  05/16/18 186 lb 12 oz (84.7 kg)    Physical Exam Vitals signs reviewed.  Constitutional:      Appearance: Normal appearance. He is not ill-appearing or diaphoretic.  HENT:     Nose: Nose normal.     Mouth/Throat:     Mouth: Mucous membranes are moist.     Pharynx: No posterior oropharyngeal erythema.  Eyes:     General: No scleral icterus. Neck:     Musculoskeletal: Normal range of motion and neck supple. No muscular tenderness.  Cardiovascular:  Rate and Rhythm: Normal rate and regular rhythm.     Heart sounds: No murmur.  Pulmonary:     Effort: Pulmonary effort is normal.     Breath sounds: No stridor. No wheezing, rhonchi or rales.  Abdominal:     General: Abdomen is flat. Bowel sounds are normal. There is no distension.     Palpations: Abdomen is soft. There is no hepatomegaly, splenomegaly or mass.     Tenderness: There is no abdominal tenderness.  Musculoskeletal: Normal range of motion.     Right lower leg: No edema.     Left lower leg: No edema.  Lymphadenopathy:     Cervical: No cervical adenopathy.  Skin:    General: Skin is  warm and dry.  Neurological:     General: No focal deficit present.     Mental Status: He is alert.  Psychiatric:        Mood and Affect: Mood normal.        Behavior: Behavior normal.     Lab Results  Component Value Date   WBC 5.4 04/10/2018   HGB 15.8 04/10/2018   HCT 47.1 04/10/2018   PLT 192.0 04/10/2018   GLUCOSE 78 12/05/2018   CHOL 156 04/10/2018   TRIG 96.0 04/10/2018   HDL 65.70 04/10/2018   LDLDIRECT 183.6 01/31/2011   LDLCALC 71 04/10/2018   ALT 24 04/10/2018   AST 19 04/10/2018   NA 138 12/05/2018   K 4.3 12/05/2018   CL 103 12/05/2018   CREATININE 1.00 12/05/2018   BUN 20 12/05/2018   CO2 26 12/05/2018   TSH 6.44 (H) 12/05/2018   PSA 0.015 11/23/2018   HGBA1C 7.1 (H) 12/05/2018   MICROALBUR 2.0 (H) 04/10/2018    Dg Chest 2 View  Result Date: 04/10/2018 CLINICAL DATA:  Cough EXAM: CHEST - 2 VIEW COMPARISON:  12/03/2008 FINDINGS: Heart and mediastinal contours are within normal limits. No focal opacities or effusions. No acute bony abnormality. IMPRESSION: No active cardiopulmonary disease. Electronically Signed   By: Rolm Baptise M.D.   On: 04/10/2018 17:15    Assessment & Plan:   Majd was seen today for hypertension, diabetes and hypothyroidism.  Diagnoses and all orders for this visit:  Acquired hypothyroidism- His TSH is essentially unchanged at 6.44.  He is euthyroid.  At this time I do not recommend thyroid replacement therapy. -     TSH; Future  Essential hypertension, benign- His blood pressure is well controlled.  Electrolytes and renal function are normal. -     Basic metabolic panel; Future  Type II diabetes mellitus with manifestations ([place])- His A1c is at 7.1%.  His blood sugars are adequately well controlled. -     Basic metabolic panel; Future -     Hemoglobin A1c; Future   I am having Clifford Brown maintain his aspirin EC, FREESTYLE LITE, Azelastine-Fluticasone, telmisartan, rosuvastatin, metFORMIN, Insulin Pen Needle, Insulin  Glargine, Bydureon BCise, Multiple Vitamins-Minerals (MULTI COMPLETE PO), and Jardiance.  No orders of the defined types were placed in this encounter.    Follow-up: Return in about 4 months (around 04/06/2019).  [name], MD

## 2018-12-06 ENCOUNTER — Encounter: Payer: Self-pay | Admitting: Internal Medicine

## 2019-01-13 ENCOUNTER — Other Ambulatory Visit: Payer: Self-pay | Admitting: Internal Medicine

## 2019-01-13 DIAGNOSIS — I10 Essential (primary) hypertension: Secondary | ICD-10-CM

## 2019-01-13 DIAGNOSIS — E785 Hyperlipidemia, unspecified: Secondary | ICD-10-CM

## 2019-01-13 DIAGNOSIS — Z794 Long term (current) use of insulin: Secondary | ICD-10-CM

## 2019-01-13 DIAGNOSIS — E118 Type 2 diabetes mellitus with unspecified complications: Secondary | ICD-10-CM

## 2019-02-28 ENCOUNTER — Other Ambulatory Visit: Payer: Self-pay | Admitting: Internal Medicine

## 2019-02-28 DIAGNOSIS — E118 Type 2 diabetes mellitus with unspecified complications: Secondary | ICD-10-CM

## 2019-04-16 ENCOUNTER — Other Ambulatory Visit: Payer: Self-pay | Admitting: Internal Medicine

## 2019-04-16 DIAGNOSIS — Z794 Long term (current) use of insulin: Secondary | ICD-10-CM

## 2019-04-16 DIAGNOSIS — E118 Type 2 diabetes mellitus with unspecified complications: Secondary | ICD-10-CM

## 2019-05-10 ENCOUNTER — Ambulatory Visit: Payer: Medicare Other | Attending: Internal Medicine

## 2019-05-10 DIAGNOSIS — Z23 Encounter for immunization: Secondary | ICD-10-CM | POA: Insufficient documentation

## 2019-05-10 NOTE — Progress Notes (Signed)
   Covid-19 Vaccination Clinic  Name:  Clifford Brown    MRN: [id number] DOB: 04-Feb-1951  05/10/2019  [name] was observed post Covid-19 immunization for 15 minutes without incidence. He was provided with Vaccine Information Sheet and instruction to access the V-Safe system.   [name] was instructed to call 911 with any severe reactions post vaccine: Marland Kitchen Difficulty breathing  . Swelling of your face and throat  . A fast heartbeat  . A bad rash all over your body  . Dizziness and weakness    Immunizations Administered    Name Date Dose VIS Date Route   Pfizer COVID-19 Vaccine 05/10/2019 10:57 AM 0.3 mL 02/22/2019 Intramuscular   Manufacturer: [place]   Lot: [id number]   [place]: [id number]

## 2019-05-30 ENCOUNTER — Other Ambulatory Visit: Payer: Self-pay

## 2019-05-30 ENCOUNTER — Encounter: Payer: Self-pay | Admitting: Internal Medicine

## 2019-05-30 ENCOUNTER — Ambulatory Visit (INDEPENDENT_AMBULATORY_CARE_PROVIDER_SITE_OTHER): Payer: Medicare Other | Admitting: Internal Medicine

## 2019-05-30 ENCOUNTER — Other Ambulatory Visit: Payer: Self-pay | Admitting: Internal Medicine

## 2019-05-30 VITALS — BP 136/82 | HR 90 | Temp 97.8°F | Resp 16 | Ht 70.0 in | Wt 178.5 lb

## 2019-05-30 DIAGNOSIS — Z794 Long term (current) use of insulin: Secondary | ICD-10-CM

## 2019-05-30 DIAGNOSIS — I10 Essential (primary) hypertension: Secondary | ICD-10-CM | POA: Diagnosis not present

## 2019-05-30 DIAGNOSIS — E785 Hyperlipidemia, unspecified: Secondary | ICD-10-CM

## 2019-05-30 DIAGNOSIS — Z0000 Encounter for general adult medical examination without abnormal findings: Secondary | ICD-10-CM | POA: Diagnosis not present

## 2019-05-30 DIAGNOSIS — E118 Type 2 diabetes mellitus with unspecified complications: Secondary | ICD-10-CM

## 2019-05-30 DIAGNOSIS — E039 Hypothyroidism, unspecified: Secondary | ICD-10-CM | POA: Diagnosis not present

## 2019-05-30 LAB — HEPATIC FUNCTION PANEL
ALT: 23 U/L (ref 0–53)
AST: 18 U/L (ref 0–37)
Albumin: 4 g/dL (ref 3.5–5.2)
Alkaline Phosphatase: 84 U/L (ref 39–117)
Bilirubin, Direct: 0.1 mg/dL (ref 0.0–0.3)
Total Bilirubin: 0.6 mg/dL (ref 0.2–1.2)
Total Protein: 7 g/dL (ref 6.0–8.3)

## 2019-05-30 LAB — CBC WITH DIFFERENTIAL/PLATELET
Basophils Absolute: 0.1 10*3/uL (ref 0.0–0.1)
Basophils Relative: 1.1 % (ref 0.0–3.0)
Eosinophils Absolute: 0.2 10*3/uL (ref 0.0–0.7)
Eosinophils Relative: 3.5 % (ref 0.0–5.0)
HCT: 46.6 % (ref 39.0–52.0)
Hemoglobin: 15.6 g/dL (ref 13.0–17.0)
Lymphocytes Relative: 26.8 % (ref 12.0–46.0)
Lymphs Abs: 1.5 10*3/uL (ref 0.7–4.0)
MCHC: 33.5 g/dL (ref 30.0–36.0)
MCV: 89.1 fl (ref 78.0–100.0)
Monocytes Absolute: 0.3 10*3/uL (ref 0.1–1.0)
Monocytes Relative: 4.9 % (ref 3.0–12.0)
Neutro Abs: 3.5 10*3/uL (ref 1.4–7.7)
Neutrophils Relative %: 63.7 % (ref 43.0–77.0)
Platelets: 211 10*3/uL (ref 150.0–400.0)
RBC: 5.23 Mil/uL (ref 4.22–5.81)
RDW: 13.2 % (ref 11.5–15.5)
WBC: 5.5 10*3/uL (ref 4.0–10.5)

## 2019-05-30 LAB — LIPID PANEL
Cholesterol: 174 mg/dL (ref 0–200)
HDL: 69.6 mg/dL (ref >39.00)
LDL Cholesterol: 77 mg/dL (ref 0–99)
NonHDL: 103.95
Total CHOL/HDL Ratio: 2
Triglycerides: 136 mg/dL (ref 0.0–149.0)
VLDL: 27.2 mg/dL (ref 0.0–40.0)

## 2019-05-30 LAB — URINALYSIS, ROUTINE W REFLEX MICROSCOPIC
Bilirubin Urine: NEGATIVE
Hgb urine dipstick: NEGATIVE
Ketones, ur: NEGATIVE
Leukocytes,Ua: NEGATIVE
Nitrite: NEGATIVE
RBC / HPF: NONE SEEN (ref 0–?)
Specific Gravity, Urine: 1.025 (ref 1.000–1.030)
Total Protein, Urine: NEGATIVE
Urine Glucose: >=1000 — AB
Urobilinogen, UA: 0.2 (ref 0.0–1.0)
WBC, UA: NONE SEEN (ref 0–?)
pH: 5.5 (ref 5.0–8.0)

## 2019-05-30 LAB — BASIC METABOLIC PANEL
BUN: 21 mg/dL (ref 6–23)
CO2: 26 mEq/L (ref 19–32)
Calcium: 9.4 mg/dL (ref 8.4–10.5)
Chloride: 105 mEq/L (ref 96–112)
Creatinine, Ser: 1.09 mg/dL (ref 0.40–1.50)
GFR: 67.14 mL/min (ref >60.00)
Glucose, Bld: 80 mg/dL (ref 70–99)
Potassium: 4.5 mEq/L (ref 3.5–5.1)
Sodium: 136 mEq/L (ref 135–145)

## 2019-05-30 LAB — MICROALBUMIN / CREATININE URINE RATIO
Creatinine,U: 57.3 mg/dL
Microalb Creat Ratio: 3.9 mg/g (ref 0.0–30.0)
Microalb, Ur: 2.2 mg/dL — ABNORMAL HIGH (ref 0.0–1.9)

## 2019-05-30 LAB — TSH: TSH: 5.82 u[IU]/mL — ABNORMAL HIGH (ref 0.35–4.50)

## 2019-05-30 LAB — HEMOGLOBIN A1C: Hgb A1c MFr Bld: 7.9 % — ABNORMAL HIGH (ref 4.6–6.5)

## 2019-05-30 MED ORDER — BYDUREON BCISE 2 MG/0.85ML ~~LOC~~ AUIJ: 1.0000 | AUTO-INJECTOR | SUBCUTANEOUS | 1 refills | Status: DC

## 2019-05-30 NOTE — Progress Notes (Signed)
Subjective:  Patient ID: Clifford Brown, male    DOB: February 12, 1951  Age: 69 y.o. MRN: [id number]  CC: Annual Exam, Hyperlipidemia, and Diabetes   HPI Clifford Brown presents for a CPX.  He has a new puppy dog and tells me they walks several miles a day.  He denies any recent episodes of chest pain, shortness of breath, dizziness, diaphoresis, palpitations, edema, or fatigue.  He thinks his blood sugar is adequately well controlled.  He is not real sure about compliance with all of his glycemic agents though.  He tells me his blood pressure has been well controlled.  Outpatient Medications Prior to Visit  Medication Sig Dispense Refill  . aspirin EC 81 MG tablet Take 1 tablet (81 mg total) by mouth daily. 90 tablet 3  . Azelastine-Fluticasone (DYMISTA) 137-50 MCG/ACT SUSP Place 2 puffs into the nose 2 (two) times daily. 23 g 11  . FREESTYLE LITE test strip USE AS DIRECTED 3 TIMES A DAY 300 each 3  . Insulin Glargine (LANTUS SOLOSTAR) 100 UNIT/ML Solostar Pen Inject 30 Units into the skin daily. 5 pen 2  . Insulin Pen Needle (B-D ULTRAFINE III SHORT PEN) 31G X 8 MM MISC USE DAILY WITH INSULIN 100 each 1  . JARDIANCE 25 MG TABS tablet TAKE 1 TABLET BY MOUTH EVERY DAY 90 tablet 0  . metFORMIN (GLUCOPHAGE-XR) 750 MG 24 hr tablet TAKE 2 TABLETS BY MOUTH DAILY WITH BREAKFAST. 180 tablet 1  . rosuvastatin (CRESTOR) 10 MG tablet TAKE 1 TABLET BY MOUTH EVERY DAY 90 tablet 1  . telmisartan (MICARDIS) 40 MG tablet TAKE 1 TABLET BY MOUTH EVERY DAY 90 tablet 1  . BYDUREON BCISE 2 MG/0.85ML AUIJ INJECT 1 PEN AS DIRECTED ONCE A WEEK. 10.2 mL 1  . Multiple Vitamins-Minerals (MULTI COMPLETE PO) Take 1 tablet by mouth daily.     No facility-administered medications prior to visit.    ROS Review of Systems  Constitutional: Negative for appetite change, diaphoresis, fatigue and unexpected weight change.  HENT: Negative.   Eyes: Negative for visual disturbance.  Respiratory: Negative for cough, chest  tightness, shortness of breath and wheezing.   Cardiovascular: Negative for chest pain, palpitations and leg swelling.  Gastrointestinal: Negative for abdominal pain, constipation, diarrhea, nausea and vomiting.  Endocrine: Negative.  Negative for polydipsia, polyphagia and polyuria.  Genitourinary: Negative.  Negative for difficulty urinating and dysuria.  Musculoskeletal: Negative.  Negative for arthralgias, myalgias and neck pain.  Skin: Negative.  Negative for color change, pallor and rash.  Neurological: Negative.  Negative for dizziness, weakness, light-headedness, numbness and headaches.  Hematological: Negative for adenopathy. Does not bruise/bleed easily.  Psychiatric/Behavioral: Negative.     Objective:  BP 136/82 (BP Location: Left Arm, Patient Position: Sitting, Cuff Size: Large)   Pulse 90   Temp 97.8 F (36.6 C) (Oral)   Resp 16   Ht 5\' 10"  (1.778 m)   Wt 178 lb 8 oz (81 kg)   SpO2 96%   BMI 25.61 kg/m   BP Readings from Last 3 Encounters:  05/30/19 136/82  12/05/18 120/80  11/21/18 138/85    Wt Readings from Last 3 Encounters:  05/30/19 178 lb 8 oz (81 kg)  12/05/18 184 lb (83.5 kg)  11/21/18 185 lb (83.9 kg)    Physical Exam Vitals reviewed.  Constitutional:      Appearance: Normal appearance.  HENT:     Nose: Nose normal.     Mouth/Throat:     Mouth: Mucous membranes are moist.  Eyes:     General: No scleral icterus.    Conjunctiva/sclera: Conjunctivae normal.  Cardiovascular:     Rate and Rhythm: Normal rate and regular rhythm.     Heart sounds: No murmur. No gallop.      Comments: EKG - NSR, 94 bpm Early repol - no change compared to EKG of 2012 No LVH Pulmonary:     Effort: Pulmonary effort is normal.     Breath sounds: No stridor. No wheezing, rhonchi or rales.  Abdominal:     General: Abdomen is protuberant. Bowel sounds are normal. There is no distension.     Palpations: Abdomen is soft. There is no hepatomegaly, splenomegaly or mass.       Tenderness: There is no abdominal tenderness.  Musculoskeletal:     Cervical back: Neck supple.     Right lower leg: No edema.     Left lower leg: No edema.  Lymphadenopathy:     Cervical: No cervical adenopathy.  Skin:    Findings: No lesion or rash.  Neurological:     General: No focal deficit present.     Mental Status: He is alert and oriented to person, place, and time. Mental status is at baseline.  Psychiatric:        Mood and Affect: Mood normal.     Lab Results  Component Value Date   WBC 5.5 05/30/2019   HGB 15.6 05/30/2019   HCT 46.6 05/30/2019   PLT 211.0 05/30/2019   GLUCOSE 80 05/30/2019   CHOL 174 05/30/2019   TRIG 136.0 05/30/2019   HDL 69.60 05/30/2019   LDLDIRECT 183.6 01/31/2011   LDLCALC 77 05/30/2019   ALT 23 05/30/2019   AST 18 05/30/2019   NA 136 05/30/2019   K 4.5 05/30/2019   CL 105 05/30/2019   CREATININE 1.09 05/30/2019   BUN 21 05/30/2019   CO2 26 05/30/2019   TSH 5.82 (H) 05/30/2019   PSA 0.015 11/23/2018   HGBA1C 7.9 (H) 05/30/2019   MICROALBUR 2.2 (H) 05/30/2019    DG Chest 2 View  Result Date: 04/10/2018 CLINICAL DATA:  Cough EXAM: CHEST - 2 VIEW COMPARISON:  12/03/2008 FINDINGS: Heart and mediastinal contours are within normal limits. No focal opacities or effusions. No acute bony abnormality. IMPRESSION: No active cardiopulmonary disease. Electronically Signed   By: Rolm Baptise M.D.   On: 04/10/2018 17:15    Assessment & Plan:   Clifford Brown was seen today for annual exam, hyperlipidemia and diabetes.  Diagnoses and all orders for this visit:  Essential hypertension, benign- His blood pressure is adequately well controlled.  Electrolytes and renal function are normal.  His EKG is negative for LVH or ischemia. -     CBC with Differential/Platelet; Future -     TSH; Future -     Urinalysis, Routine w reflex microscopic; Future -     EKG 12-Lead -     Urinalysis, Routine w reflex microscopic -     TSH -     CBC with  Differential/Platelet  Type II diabetes mellitus with manifestations ([place])- His A1c is up to 7.9%.  I have asked him to ensure compliance with all of his glycemic agents.  He was also encouraged to improve his lifestyle modifications. -     Basic metabolic panel; Future -     Microalbumin / creatinine urine ratio; Future -     Hemoglobin A1c; Future -     Hemoglobin A1c -     Microalbumin / creatinine  urine ratio -     Basic metabolic panel -     Exenatide ER (BYDUREON BCISE) 2 MG/0.85ML AUIJ; Inject 1 Act into the skin once a week.  Dyslipidemia (high LDL; low HDL)- He has achieved his LDL goal and is doing well on the statin. -     Lipid panel; Future -     TSH; Future -     Hepatic function panel; Future -     Hepatic function panel -     TSH -     Lipid panel  Routine general medical examination at a health care facility- Exam completed, labs reviewed, no vaccines were given today so as to not interfere with COVID-19 vaccinations, colon cancer screening is up-to-date, patient education was given.  Acquired hypothyroidism- He has a minor elevation in his TSH but clinically he is euthyroid.  I do not think thyroid replacement therapy is indicated  Type 2 diabetes mellitus with complication, without long-term current use of insulin (HCC) -     Exenatide ER (BYDUREON BCISE) 2 MG/0.85ML AUIJ; Inject 1 Act into the skin once a week.  Type 2 diabetes mellitus with complication, with long-term current use of insulin (HCC) -     Exenatide ER (BYDUREON BCISE) 2 MG/0.85ML AUIJ; Inject 1 Act into the skin once a week.   I have discontinued Cornelia Copa Grimmer's Multiple Vitamins-Minerals (MULTI COMPLETE PO). I have also changed his Bydureon BCise. Additionally, I am having him maintain his aspirin EC, FREESTYLE LITE, Azelastine-Fluticasone, insulin glargine, metFORMIN, rosuvastatin, telmisartan, Jardiance, and B-D ULTRAFINE III SHORT PEN.  Meds ordered this encounter  Medications  . Exenatide ER  (BYDUREON BCISE) 2 MG/0.85ML AUIJ    Sig: Inject 1 Act into the skin once a week.    Dispense:  10.2 mL    Refill:  1     Follow-up: Return in about 6 months (around 11/30/2019).  [name], MD

## 2019-05-30 NOTE — Patient Instructions (Signed)

## 2019-06-03 ENCOUNTER — Encounter: Payer: Self-pay | Admitting: Internal Medicine

## 2019-06-04 ENCOUNTER — Ambulatory Visit: Payer: Medicare Other | Attending: Internal Medicine

## 2019-06-04 DIAGNOSIS — Z23 Encounter for immunization: Secondary | ICD-10-CM

## 2019-06-04 NOTE — Progress Notes (Signed)
   Covid-19 Vaccination Clinic  Name:  Clifford Brown    MRN: [id number] DOB: Apr 16, 1950  06/04/2019  [name] was observed post Covid-19 immunization for 15 minutes without incident. He was provided with Vaccine Information Sheet and instruction to access the V-Safe system.   [name] was instructed to call 911 with any severe reactions post vaccine: Marland Kitchen Difficulty breathing  . Swelling of face and throat  . A fast heartbeat  . A bad rash all over body  . Dizziness and weakness   Immunizations Administered    Name Date Dose VIS Date Route   Pfizer COVID-19 Vaccine 06/04/2019 12:56 PM 0.3 mL 02/22/2019 Intramuscular   Manufacturer: [place]   Lot: [id number]   [place]: [id number]

## 2019-06-11 ENCOUNTER — Encounter: Payer: Self-pay | Admitting: Internal Medicine

## 2019-06-16 ENCOUNTER — Other Ambulatory Visit: Payer: Self-pay | Admitting: Internal Medicine

## 2019-06-16 DIAGNOSIS — E118 Type 2 diabetes mellitus with unspecified complications: Secondary | ICD-10-CM

## 2019-06-19 DIAGNOSIS — E119 Type 2 diabetes mellitus without complications: Secondary | ICD-10-CM | POA: Diagnosis not present

## 2019-06-19 LAB — HM DIABETES EYE EXAM

## 2019-06-20 ENCOUNTER — Telehealth: Payer: Self-pay | Admitting: Internal Medicine

## 2019-06-20 DIAGNOSIS — G5603 Carpal tunnel syndrome, bilateral upper limbs: Secondary | ICD-10-CM

## 2019-06-20 NOTE — Chronic Care Management (AMB) (Signed)
  Chronic Care Management   Note  06/20/2019 Name: Kylar Kubesh MRN: [id number] DOB: 1950-06-14  Amauris Gerena is a 69 y.o. year old male who is a primary care patient of [name], MD. I reached out to [name] by phone today in response to a referral sent by Mr. Tesfa Laidler's PCP, [name], MD.   Mr. Provencio was given information about Chronic Care Management services today including:  1. CCM service includes personalized support from designated clinical staff supervised by his physician, including individualized plan of care and coordination with other care providers 2. 24/7 contact phone numbers for assistance for urgent and routine care needs. 3. Service will only be billed when office clinical staff spend 20 minutes or more in a month to coordinate care. 4. Only one practitioner may furnish and bill the service in a calendar month. 5. The patient may stop CCM services at any time (effective at the end of the month) by phone call to the office staff.   Patient agreed to services and verbal consent obtained.   Follow up plan:   Raynicia Dukes [employer] Scheduler

## 2019-06-21 ENCOUNTER — Encounter: Payer: Self-pay | Admitting: Internal Medicine

## 2019-06-30 ENCOUNTER — Other Ambulatory Visit: Payer: Self-pay | Admitting: Internal Medicine

## 2019-06-30 DIAGNOSIS — E118 Type 2 diabetes mellitus with unspecified complications: Secondary | ICD-10-CM

## 2019-07-03 ENCOUNTER — Encounter: Payer: Self-pay | Admitting: Internal Medicine

## 2019-07-03 DIAGNOSIS — E118 Type 2 diabetes mellitus with unspecified complications: Secondary | ICD-10-CM

## 2019-07-04 MED ORDER — LANTUS SOLOSTAR 100 UNIT/ML ~~LOC~~ SOPN: 30.0000 [IU] | PEN_INJECTOR | Freq: Every day | SUBCUTANEOUS | 1 refills | Status: DC

## 2019-07-06 ENCOUNTER — Other Ambulatory Visit: Payer: Self-pay | Admitting: Internal Medicine

## 2019-07-06 DIAGNOSIS — I10 Essential (primary) hypertension: Secondary | ICD-10-CM

## 2019-07-06 DIAGNOSIS — E118 Type 2 diabetes mellitus with unspecified complications: Secondary | ICD-10-CM

## 2019-07-06 DIAGNOSIS — E785 Hyperlipidemia, unspecified: Secondary | ICD-10-CM

## 2019-07-06 DIAGNOSIS — Z794 Long term (current) use of insulin: Secondary | ICD-10-CM

## 2019-07-14 ENCOUNTER — Other Ambulatory Visit: Payer: Self-pay | Admitting: Internal Medicine

## 2019-07-14 DIAGNOSIS — E118 Type 2 diabetes mellitus with unspecified complications: Secondary | ICD-10-CM

## 2019-07-15 ENCOUNTER — Telehealth: Payer: Medicare Other

## 2019-07-15 NOTE — Chronic Care Management (AMB) (Signed)
Chronic Care Management Pharmacy  Name: Clifford Brown  MRN: [id number] DOB: [birth year]   Chief Complaint/ HPI  [name],  69 y.o. , male presents for their Initial CCM visit with the clinical pharmacist via telephone due to COVID-19 Pandemic.  PCP : [name], MD  Their chronic conditions include: HTN, T2DM, HLD, hypothyroidism, allergies, depression,   Office Visits: 05/30/19 Dr [name] OV: A1c up to 7.9, emphasized compliance and lifestyle. Mildly elevated TSH but clinically euthyroid.   Consult Visit: n/a  Medications: Outpatient Encounter Medications as of 07/16/2019  Medication Sig  . aspirin EC 81 MG tablet Take 1 tablet (81 mg total) by mouth daily.  . Azelastine-Fluticasone (DYMISTA) 137-50 MCG/ACT SUSP Place 2 puffs into the nose 2 (two) times daily.  . Exenatide ER (BYDUREON BCISE) 2 MG/0.85ML AUIJ Inject 1 Act into the skin once a week.  Marland Kitchen FREESTYLE LITE test strip USE AS DIRECTED 3 TIMES A DAY  . insulin glargine (LANTUS SOLOSTAR) 100 UNIT/ML Solostar Pen Inject 30 Units into the skin daily.  . Insulin Pen Needle (B-D ULTRAFINE III SHORT PEN) 31G X 8 MM MISC USE DAILY WITH INSULIN  . JARDIANCE 25 MG TABS tablet TAKE 1 TABLET BY MOUTH EVERY DAY  . rosuvastatin (CRESTOR) 10 MG tablet TAKE 1 TABLET BY MOUTH EVERY DAY  . telmisartan (MICARDIS) 40 MG tablet TAKE 1 TABLET BY MOUTH EVERY DAY  . metFORMIN (GLUCOPHAGE-XR) 750 MG 24 hr tablet TAKE 2 TABLETS BY MOUTH DAILY WITH BREAKFAST. (Patient not taking: Reported on 07/16/2019)  . [DISCONTINUED] Insulin Glargine (LANTUS SOLOSTAR) 100 UNIT/ML Solostar Pen Inject 30 Units into the skin daily.  . [DISCONTINUED] JARDIANCE 25 MG TABS tablet TAKE 1 TABLET BY MOUTH EVERY DAY  . [DISCONTINUED] metFORMIN (GLUCOPHAGE-XR) 750 MG 24 hr tablet TAKE 2 TABLETS BY MOUTH DAILY WITH BREAKFAST.  . [DISCONTINUED] rosuvastatin (CRESTOR) 10 MG tablet TAKE 1 TABLET BY MOUTH EVERY DAY  . [DISCONTINUED] telmisartan (MICARDIS) 40 MG tablet TAKE  1 TABLET BY MOUTH EVERY DAY   No facility-administered encounter medications on file as of 07/16/2019.     Current Diagnosis/Assessment:  SDOH Interventions     Most Recent Value  SDOH Interventions  SDOH Interventions for the Following Domains  Financial Strain  Financial Strain Interventions  Other (Comment) [pt reports no issues paying for medications]     Goals Addressed            This Visit's Progress   . Pharmacy Care Plan       CARE PLAN ENTRY  Current Barriers:  . Chronic Disease Management support, education, and care coordination needs related to Hypertension, Hyperlipidemia, and Diabetes   Hypertension . Pharmacist Clinical Goal(s): o Over the next 90 days, patient will work with PharmD and providers to maintain BP goal < 130/80 . Current regimen:  o Telmisartan 40 mg daily . Interventions: o Discussed BP goals and benefits of telmisartan for BP control and kidney protection . Patient self care activities - Over the next 90 days, patient will: o Ensure daily salt intake < 2300 mg/day  Hyperlipidemia Lipid Panel  Component Value Date/Time   CHOL 174 05/30/2019 1018   TRIG 136.0 05/30/2019 1018   HDL 69.60 05/30/2019 1018   LDLCALC 77 05/30/2019 1018 .  Pharmacist Clinical Goal(s): o Over the next 90 days, patient will work with PharmD and providers to maintain LDL goal < 100 . Current regimen:  o Rosuvastatin 10 mg daily . Interventions: o Discussed benefits of statin for  cholesterol control and heart disease prevention . Patient self care activities - Over the next 90 days, patient will: o Take medication as prescribed o Adhere to low cholesterol diet  Diabetes Hgb A1c MFr Bld  Date Value Ref Range Status  05/30/2019 7.9 (H) 4.6 - 6.5 % Final .  Pharmacist Clinical Goal(s): o Over the next 90 days, patient will work with PharmD and providers to achieve A1c goal < 7% . Current regimen:  o Metformin ER 750 mg - not taking o Jardiance 25 mg  daily o Bydureon BCise 2 mg once weekly o Lantus 30 units daily . Interventions: o Discussed importance of metformin for controlling sugars - Expect metformin to reduce A1c by ~1% o Discussed combination metformin + Jardiance Iva Boop) - Patient has just refilled Jardiance for 90 days so will wait to complete this before starting Synjardy . Patient self care activities - Over the next 90 days, patient will: o Check blood sugar 2-3 times weekly, document, and provide at future appointments o Contact provider with any episodes of hypoglycemia o Restart metformin ER 750 mg twice daily  Medication management . Pharmacist Clinical Goal(s): o Over the next 90 days, patient will work with PharmD and providers to achieve optimal medication adherence . Current [facility] . Interventions o Comprehensive medication review performed. o Continue current medication management strategy . Patient self care activities - Over the next 90 days, patient will: o Focus on medication adherence by pill box o Take medications as prescribed o Report any questions or concerns to PharmD and/or provider(s)  Initial goal documentation        Hypertension   Office blood pressures are  BP Readings from Last 3 Encounters:  05/30/19 136/82  12/05/18 120/80  11/21/18 138/85    Patient has failed these meds in the past: n/a Patient is currently controlled on the following medications:   Telmisartan 40 mg daily  Patient checks BP at home infrequently  Patient home BP readings are ranging: n/a  We discussed diet and exercise extensively; BP goals and benefits of telmisartan for renal protection.  Plan  Continue current medications and control with diet and exercise     Diabetes   Recent Relevant Labs: Lab Results  Component Value Date/Time   HGBA1C 7.9 (H) 05/30/2019 10:18 AM   HGBA1C 7.1 (H) 12/05/2018 09:37 AM   GFR 67.14 05/30/2019 10:18 AM   GFR 74.27 12/05/2018 09:37 AM    MICROALBUR 2.2 (H) 05/30/2019 10:18 AM   MICROALBUR 2.0 (H) 04/10/2018 12:20 PM    Checking BG: Weekly  Recent FBG Readings: 110-135  Patient has failed these meds in past: [place], [place], [place] Patient is currently uncontrolled on the following medications:   Metformin ER 750 mg - not taking  Jardiance 25 mg daily  Bydureon BCise 2 mg once weekly - Sun  Lantus 30 units daily  Last diabetic Eye exam:  Lab Results  Component Value Date/Time   HMDIABEYEEXA No Retinopathy 06/19/2019 12:00 AM    Last diabetic Foot exam:  Lab Results  Component Value Date/Time   HMDIABFOOTEX done 09/20/2017 12:00 AM     We discussed: pt has not been taking metformin for several months, he thought Jardiance was a metformin substitute. Discussed benefits of metformin and importance of taking it in addition to [place] and other meds. Recent increase in A1c is likely due to inadvertant d/c metformin. Pt agreed to restart.   Plan  Continue current medications and control with diet and exercise  Restart metformin ER 750 mg BID  Hyperlipidemia   Lipid Panel     Component Value Date/Time   CHOL 174 05/30/2019 1018   TRIG 136.0 05/30/2019 1018   HDL 69.60 05/30/2019 1018   CHOLHDL 2 05/30/2019 1018   VLDL 27.2 05/30/2019 1018   LDLCALC 77 05/30/2019 1018     The 10-year ASCVD risk score Mikey Bussing DC Jr., et al., 2013) is: 28.5%   Values used to calculate the score:     Age: 47 years     Sex: Male     Is Non-Hispanic African American: No     Diabetic: Yes     Tobacco smoker: No     Systolic Blood Pressure: [id number] mmHg     Is BP treated: Yes     HDL Cholesterol: 69.6 mg/dL     Total Cholesterol: 174 mg/dL   Patient has failed these meds in past: n/a Patient is currently controlled on the following medications:   Rosuvastatin 10 mg daily  Aspirin 81 mg daily  We discussed:  diet and exercise extensively; cholesterol goals and benefit of statin for ASCVD risk  reduction.  Plan  Continue current medications and control with diet and exercise   Allergies   Patient has failed these meds in past: n/a Patient is currently controlled on the following medications:   Dymista (azelastine-fluticasone) nasal spray  We discussed:  Rarely ever uses nasal spray, suffers seasonal allergies only.  Plan  Continue to monitor symptoms  Health Maintenance   Patient is currently controlled on the following medications:   Glucosamine - joint pain  Multivitamin  Excedrin vs Ibuprofen - when working, 3x per week  We discussed: Pt started glucosamine ~a month ago to help with joint pain, he has noticed improvement. He alternates Excedrin or ibuprofen a few times a week to help with pain while working.  Plan  Continue current medications   Medication Management   [facility] Uses pill box? Yes Pt endorses 100% compliance  We discussed: [facility] with [facility], he is able to get most medications free or cheap because he still has Pharmacist, community and is able to get coupons.  Plan  Continue current medication management strategy    Follow up: 3 month phone visit  Charlene Brooke, PharmD Clinical Pharmacist [facility] [phone]

## 2019-07-15 NOTE — Addendum Note (Signed)
Addended by: Aviva Signs M on: 07/15/2019 01:11 PM   Modules accepted: Orders

## 2019-07-16 ENCOUNTER — Other Ambulatory Visit: Payer: Self-pay

## 2019-07-16 ENCOUNTER — Ambulatory Visit: Payer: Medicare Other | Admitting: Pharmacist

## 2019-07-16 DIAGNOSIS — I10 Essential (primary) hypertension: Secondary | ICD-10-CM

## 2019-07-16 DIAGNOSIS — E785 Hyperlipidemia, unspecified: Secondary | ICD-10-CM

## 2019-07-16 DIAGNOSIS — E118 Type 2 diabetes mellitus with unspecified complications: Secondary | ICD-10-CM

## 2019-07-16 NOTE — Patient Instructions (Addendum)
Visit Information  Thank you for meeting with me to discuss your medications! I look forward to working with you to achieve your health care goals. Below is a summary of what we talked about during the visit:  Goals Addressed            This Visit's Progress   . Pharmacy Care Plan       CARE PLAN ENTRY  Current Barriers:  . Chronic Disease Management support, education, and care coordination needs related to Hypertension, Hyperlipidemia, and Diabetes   Hypertension . Pharmacist Clinical Goal(s): o Over the next 90 days, patient will work with PharmD and providers to maintain BP goal < 130/80 . Current regimen:  o Telmisartan 40 mg daily . Interventions: o Discussed BP goals and benefits of telmisartan for BP control and kidney protection . Patient self care activities - Over the next 90 days, patient will: o Ensure daily salt intake < 2300 mg/day  Hyperlipidemia Lipid Panel  Component Value Date/Time   CHOL 174 05/30/2019 1018   TRIG 136.0 05/30/2019 1018   HDL 69.60 05/30/2019 1018   LDLCALC 77 05/30/2019 1018 .  Pharmacist Clinical Goal(s): o Over the next 90 days, patient will work with PharmD and providers to maintain LDL goal < 100 . Current regimen:  o Rosuvastatin 10 mg daily . Interventions: o Discussed benefits of statin for cholesterol control and heart disease prevention . Patient self care activities - Over the next 90 days, patient will: o Take medication as prescribed o Adhere to low cholesterol diet  Diabetes Hgb A1c MFr Bld  Date Value Ref Range Status  05/30/2019 7.9 (H) 4.6 - 6.5 % Final .  Pharmacist Clinical Goal(s): o Over the next 90 days, patient will work with PharmD and providers to achieve A1c goal < 7% . Current regimen:  o Metformin ER 750 mg - not taking o Jardiance 25 mg daily o Bydureon BCise 2 mg once weekly o Lantus 30 units daily . Interventions: o Discussed importance of metformin for controlling sugars - Expect metformin to  reduce A1c by ~1% o Discussed combination metformin + Jardiance Iva Boop) - Patient has just refilled Jardiance for 90 days so will wait to complete this before starting Synjardy . Patient self care activities - Over the next 90 days, patient will: o Check blood sugar 2-3 times weekly, document, and provide at future appointments o Contact provider with any episodes of hypoglycemia o Restart metformin ER 750 mg twice daily  Medication management . Pharmacist Clinical Goal(s): o Over the next 90 days, patient will work with PharmD and providers to achieve optimal medication adherence . [facility] . Interventions o Comprehensive medication review performed. o Continue current medication management strategy . Patient self care activities - Over the next 90 days, patient will: o Focus on medication adherence by pill box o Take medications as prescribed o Report any questions or concerns to PharmD and/or provider(s)  Initial goal documentation        Clifford Brown was given information about Chronic Care Management services today including:  1. CCM service includes personalized support from designated clinical staff supervised by his physician, including individualized plan of care and coordination with other care providers 2. 24/7 contact phone numbers for assistance for urgent and routine care needs. 3. Standard insurance, coinsurance, copays and deductibles apply for chronic care management only during months in which we provide at least 20 minutes of these services. Most insurances cover these services at 100%, however patients  may be responsible for any copay, coinsurance and/or deductible if applicable. This service may help you avoid the need for more expensive face-to-face services. 4. Only one practitioner may furnish and bill the service in a calendar month. 5. The patient may stop CCM services at any time (effective at the end of the month) by phone call to the office  staff.  Patient agreed to services and verbal consent obtained.   The patient verbalized understanding of instructions provided today and agreed to receive a mailed copy of patient instruction and/or educational materials. Telephone follow up appointment with pharmacy team member scheduled for: 3 months  Charlene Brooke, PharmD Clinical Pharmacist [facility] [phone]    Type 2 Diabetes Mellitus, Self Care, Adult Caring for yourself after you have been diagnosed with type 2 diabetes (type 2 diabetes mellitus) means keeping your blood sugar (glucose) under control with a balance of:  Nutrition.  Exercise.  Lifestyle changes.  Medicines or insulin, if necessary.  Support from your team of health care providers and others. The following information explains what you need to know to manage your diabetes at home. What are the risks? Having diabetes can put you at risk for other long-term (chronic) conditions, such as heart disease and kidney disease. Your health care provider may prescribe medicines to help prevent complications from diabetes. These medicines may include:  Aspirin.  Medicine to lower cholesterol.  Medicine to control blood pressure. How to monitor blood glucose   Check your blood glucose every day, as often as told by your health care provider.  Have your A1c (hemoglobin A1c) level checked two or more times a year, or as often as told by your health care provider. Your health care provider will set individualized treatment goals for you. Generally, the goal of treatment is to maintain the following blood glucose levels:  Before meals (preprandial): 80-130 mg/dL (4.4-7.2 mmol/L).  After meals (postprandial): below 180 mg/dL (10 mmol/L).  A1c level: less than 7%. How to manage hyperglycemia and hypoglycemia Hyperglycemia symptoms Hyperglycemia, also called high blood glucose, occurs when blood glucose is too high. Make sure  you know the early signs of hyperglycemia, such as:  Increased thirst.  Hunger.  Feeling very tired.  Needing to urinate more often than usual.  Blurry vision. Hypoglycemia symptoms Hypoglycemia, also called low blood glucose, occurswith a blood glucose level at or below 70 mg/dL (3.9 mmol/L). The risk for hypoglycemia increases during or after exercise, during sleep, during illness, and when skipping meals or not eating for a long time (fasting). It is important to know the symptoms of hypoglycemia and treat it right away. Always have a 15-gram rapid-acting carbohydrate snack with you to treat low blood glucose. Family members and close friends should also know the symptoms and should understand how to treat hypoglycemia, in case you are not able to treat yourself. Symptoms may include:  Hunger.  Anxiety.  Sweating and feeling clammy.  Confusion.  Dizziness or feeling light-headed.  Sleepiness.  Nausea.  Increased heart rate.  Headache.  Blurry vision.  Irritability.  A change in coordination.  Tingling or numbness around the mouth, lips, or tongue.  Restless sleep.  Fainting.  Seizure. Treating hypoglycemia If you are alert and able to swallow safely, follow the 15:15 rule:  Take 15 grams of a rapid-acting carbohydrate. Talk with your health care provider about how much you should take.  Rapid-acting options include: ? Glucose pills (take 15 grams). ? 6-8 pieces of  hard candy. ? 4-6 oz (120-150 mL) of fruit juice. ? 4-6 oz (120-150 mL) of regular (not diet) soda. ? 1 Tbsp (15 mL) honey or sugar.  Check your blood glucose 15 minutes after you take the carbohydrate.  If the repeat blood glucose level is still at or below 70 mg/dL (3.9 mmol/L), take 15 grams of a carbohydrate again.  If your blood glucose level does not increase above 70 mg/dL (3.9 mmol/L) after 3 tries, seek emergency medical care.  After your blood glucose level returns to normal, eat  a meal or a snack within 1 hour. Treating severe hypoglycemia Severe hypoglycemia is when your blood glucose level is at or below 54 mg/dL (3 mmol/L). Severe hypoglycemia is an emergency. Do not wait to see if the symptoms will go away. Get medical help right away. Call your local emergency services (911 in the [place] If you have severe hypoglycemia and you cannot eat or drink, you may need an injection of glucagon. A family member or close friend should learn how to check your blood glucose and how to give you a glucagon injection. Ask your health care provider if you need to have an emergency glucagon injection kit available. Severe hypoglycemia may need to be treated in a hospital. The treatment may include getting glucose through an IV. You may also need treatment for the cause of your hypoglycemia. Follow these instructions at home: Take diabetes medicines as told  If your health care provider prescribed insulin or diabetes medicines, take them every day.  Do not run out of insulin or other diabetes medicines that you take. Plan ahead so you always have these available.  If you use insulin, adjust your dosage based on how physically active you are and what foods you eat. Your health care provider will tell you how to adjust your dosage. Make healthy food choices  The things that you eat and drink affect your blood glucose and your insulin dosage. Making good choices helps to control your diabetes and prevent other health problems. A healthy meal plan includes eating lean proteins, complex carbohydrates, fresh fruits and vegetables, low-fat dairy products, and healthy fats. Make an appointment to see a diet and nutrition specialist (registered dietitian) to help you create an eating plan that is right for you. Make sure that you:  Follow instructions from your health care provider about eating or drinking restrictions.  Drink enough fluid to keep your urine pale yellow.  Keep a record of  the carbohydrates that you eat. Do this by reading food labels and learning the standard serving sizes of foods.  Follow your sick day plan whenever you cannot eat or drink as usual. Make this plan in advance with your health care provider.  Stay active Exercise regularly, as told by your health care provider. This may include:  Stretching and doing strength exercises, such as yoga or weightlifting, 2 or more times a week.  Doing 150 minutes or more of moderate-intensity or vigorous-intensity exercise each week. This could be brisk walking, biking, or water aerobics. ? Spread out your activity over 3 or more days of the week. ? Do not go more than 2 days in a row without doing some kind of physical activity. When you start a new exercise or activity, work with your health care provider to adjust your insulin, medicines, or food intake as needed. Make healthy lifestyle choices  Do not use any tobacco products, such as cigarettes, chewing tobacco, and e-cigarettes. If you need  help quitting, ask your health care provider.  If your health care provider says that alcohol is safe for you, limit alcohol intake to no more than 1 drink per day for nonpregnant women and 2 drinks per day for men. One drink equals 12 oz of beer (355 mL), 5 oz of wine (148 mL), or 1 oz of hard liquor (44 mL).  Learn to manage stress. If you need help with this, ask your health care provider. Care for your body   Keep your immunizations up to date. In addition to getting vaccinations as told by your health care provider, it is recommended that you get vaccinated against the following illnesses: ? The flu (influenza). Get a flu shot every year. ? Pneumonia. ? Hepatitis B.  Schedule an eye exam soon after your diagnosis, and then one time every year after that.  Check your skin and feet every day for cuts, bruises, redness, blisters, or sores. Schedule a foot exam with your health care provider once every  year.  Brush your teeth and gums two times a day, and floss one or more times a day. Visit your dentist one or more times every 6 months.  Maintain a healthy weight. General instructions  Take over-the-counter and prescription medicines only as told by your health care provider.  Share your diabetes management plan with people in your workplace, school, and household.  Carry a medical alert card or wear medical alert jewelry.  Keep all follow-up visits as told by your health care provider. This is important. Questions to ask your health care provider  Do I need to meet with a diabetes educator?  Where can I find a support group for people with diabetes? Where to find more information For more information about diabetes, visit:  American Diabetes Association (ADA): www.diabetes.org  American Association of Diabetes Educators (AADE): www.diabeteseducator.org Summary  Caring for yourself after you have been diagnosed with (type 2 diabetes mellitus) means keeping your blood sugar (glucose) under control with a balance of nutrition, exercise, lifestyle changes, and medicine.  Check your blood glucose every day, as often as told by your health care provider.  Having diabetes can put you at risk for other long-term (chronic) conditions, such as heart disease and kidney disease. Your health care provider may prescribe medicines to help prevent complications from diabetes.  Keep all follow-up visits as told by your health care provider. This is important. This information is not intended to replace advice given to you by your health care provider. Make sure you discuss any questions you have with your health care provider. Document Revised: 08/21/2017 Document Reviewed: 04/03/2015 Elsevier Patient Education  2020 [employer].

## 2019-07-25 DIAGNOSIS — R200 Anesthesia of skin: Secondary | ICD-10-CM | POA: Diagnosis not present

## 2019-07-25 DIAGNOSIS — M65332 Trigger finger, left middle finger: Secondary | ICD-10-CM | POA: Diagnosis not present

## 2019-08-27 DIAGNOSIS — M65332 Trigger finger, left middle finger: Secondary | ICD-10-CM | POA: Diagnosis not present

## 2019-08-27 DIAGNOSIS — R200 Anesthesia of skin: Secondary | ICD-10-CM | POA: Diagnosis not present

## 2019-08-27 DIAGNOSIS — M533 Sacrococcygeal disorders, not elsewhere classified: Secondary | ICD-10-CM | POA: Diagnosis not present

## 2019-08-27 DIAGNOSIS — M545 Low back pain: Secondary | ICD-10-CM | POA: Diagnosis not present

## 2019-09-26 DIAGNOSIS — R200 Anesthesia of skin: Secondary | ICD-10-CM | POA: Diagnosis not present

## 2019-09-26 DIAGNOSIS — M654 Radial styloid tenosynovitis [de Quervain]: Secondary | ICD-10-CM | POA: Diagnosis not present

## 2019-09-26 DIAGNOSIS — M65332 Trigger finger, left middle finger: Secondary | ICD-10-CM | POA: Diagnosis not present

## 2019-10-16 ENCOUNTER — Ambulatory Visit: Payer: Medicare Other | Admitting: Pharmacist

## 2019-10-16 ENCOUNTER — Other Ambulatory Visit: Payer: Self-pay

## 2019-10-16 DIAGNOSIS — I10 Essential (primary) hypertension: Secondary | ICD-10-CM

## 2019-10-16 DIAGNOSIS — E785 Hyperlipidemia, unspecified: Secondary | ICD-10-CM

## 2019-10-16 DIAGNOSIS — E118 Type 2 diabetes mellitus with unspecified complications: Secondary | ICD-10-CM

## 2019-10-16 NOTE — Chronic Care Management (AMB) (Signed)
Chronic Care Management Pharmacy  Name: Clifford Brown  MRN: [id number] DOB: [birth year]   Chief Complaint/ HPI  [name],  69 y.o. , male presents for their Follow-Up CCM visit with the clinical pharmacist via telephone due to COVID-19 Pandemic.  PCP : [name], MD  Their chronic conditions include: HTN, T2DM, HLD, hypothyroidism, allergies, depression,   Office Visits: 05/30/19 Dr [name] OV: A1c up to 7.9, emphasized compliance and lifestyle. Mildly elevated TSH but clinically euthyroid.   Consult Visit:  08/27/19 Dr Grandville Silos (orthopedic surgery): OV for back pain, trigger finger.  Medications: Outpatient Encounter Medications as of 10/16/2019  Medication Sig  . aspirin EC 81 MG tablet Take 1 tablet (81 mg total) by mouth daily.  . Azelastine-Fluticasone (DYMISTA) 137-50 MCG/ACT SUSP Place 2 puffs into the nose 2 (two) times daily.  . Exenatide ER (BYDUREON BCISE) 2 MG/0.85ML AUIJ Inject 1 Act into the skin once a week.  Marland Kitchen FREESTYLE LITE test strip USE AS DIRECTED 3 TIMES A DAY  . insulin glargine (LANTUS SOLOSTAR) 100 UNIT/ML Solostar Pen Inject 30 Units into the skin daily.  . Insulin Pen Needle (B-D ULTRAFINE III SHORT PEN) 31G X 8 MM MISC USE DAILY WITH INSULIN  . JARDIANCE 25 MG TABS tablet TAKE 1 TABLET BY MOUTH EVERY DAY  . metFORMIN (GLUCOPHAGE-XR) 750 MG 24 hr tablet TAKE 2 TABLETS BY MOUTH DAILY WITH BREAKFAST.  . rosuvastatin (CRESTOR) 10 MG tablet TAKE 1 TABLET BY MOUTH EVERY DAY  . telmisartan (MICARDIS) 40 MG tablet TAKE 1 TABLET BY MOUTH EVERY DAY  . [DISCONTINUED] Insulin Glargine (LANTUS SOLOSTAR) 100 UNIT/ML Solostar Pen Inject 30 Units into the skin daily.  . [DISCONTINUED] JARDIANCE 25 MG TABS tablet TAKE 1 TABLET BY MOUTH EVERY DAY  . [DISCONTINUED] metFORMIN (GLUCOPHAGE-XR) 750 MG 24 hr tablet TAKE 2 TABLETS BY MOUTH DAILY WITH BREAKFAST.  . [DISCONTINUED] rosuvastatin (CRESTOR) 10 MG tablet TAKE 1 TABLET BY MOUTH EVERY DAY  . [DISCONTINUED]  telmisartan (MICARDIS) 40 MG tablet TAKE 1 TABLET BY MOUTH EVERY DAY   No facility-administered encounter medications on file as of 10/16/2019.     Current Diagnosis/Assessment:  SDOH Interventions     Most Recent Value  SDOH Interventions  Transportation Interventions Intervention Not Indicated     Goals Addressed            This Visit's Progress   . Pharmacy Care Plan   On track    CARE PLAN ENTRY  Current Barriers:  . Chronic Disease Management support, education, and care coordination needs related to Hypertension, Hyperlipidemia, and Diabetes   Hypertension BP Readings from Last 3 Encounters:  05/30/19 136/82  12/05/18 120/80  11/21/18 138/85 .  Pharmacist Clinical Goal(s): o Over the next 90 days, patient will work with PharmD and providers to maintain BP goal < 130/80 . Current regimen:  o Telmisartan 40 mg daily . Interventions: o Discussed BP goals and benefits of telmisartan for BP control and kidney protection . Patient self care activities - Over the next 90 days, patient will: o Ensure daily salt intake < 2300 mg/day  Hyperlipidemia Lab Results  Component Value Date/Time   LDLCALC 77 05/30/2019 10:18 AM   LDLDIRECT 183.6 01/31/2011 01:58 PM .  Pharmacist Clinical Goal(s): o Over the next 90 days, patient will work with PharmD and providers to maintain LDL goal < 100 . Current regimen:  o Rosuvastatin 10 mg daily . Interventions: o Discussed benefits of statin for cholesterol control and heart disease prevention .  Patient self care activities - Over the next 90 days, patient will: o Take medication as prescribed o Adhere to low cholesterol diet  Diabetes Lab Results  Component Value Date/Time   HGBA1C 7.9 (H) 05/30/2019 10:18 AM   HGBA1C 7.1 (H) 12/05/2018 09:37 AM .  Pharmacist Clinical Goal(s): o Over the next 90 days, patient will work with PharmD and providers to achieve A1c goal < 7% . Current regimen:  o Metformin ER 750 mg twice a  day o Jardiance 25 mg daily o Bydureon BCise 2 mg once weekly o Lantus 30 units daily . Interventions: o Discussed importance of metformin for controlling sugars - May expect metformin to reduce A1c by ~0.5-1% o Discussed combination metformin + Jardiance (Synjardy) . Patient self care activities - Over the next 90 days, patient will: o Check blood sugar 2-3 times weekly, document, and provide at future appointments o Contact provider with any episodes of hypoglycemia o Take medications as prescribed  Medication management . Pharmacist Clinical Goal(s): o Over the next 90 days, patient will work with PharmD and providers to achieve optimal medication adherence . [facility] . Interventions o Comprehensive medication review performed. o Continue current medication management strategy . Patient self care activities - Over the next 90 days, patient will: o Focus on medication adherence by pill box o Take medications as prescribed o Report any questions or concerns to PharmD and/or provider(s)  Please see past updates related to this goal by clicking on the "Past Updates" button in the selected goal         Hypertension   BP goal < 130/80  Office blood pressures are  BP Readings from Last 3 Encounters:  05/30/19 136/82  12/05/18 120/80  11/21/18 138/85   Patient has failed these meds in the past: n/a Patient is currently controlled on the following medications:   Telmisartan 40 mg daily  Patient checks BP at home infrequently  Patient home BP readings are ranging: n/a  We discussed diet and exercise extensively; BP goals and benefits of telmisartan for renal protection.  Plan  Continue current medications and control with diet and exercise     Diabetes   A1c goal < 7%  Recent Relevant Labs: Lab Results  Component Value Date/Time   HGBA1C 7.9 (H) 05/30/2019 10:18 AM   HGBA1C 7.1 (H) 12/05/2018 09:37 AM   GFR 67.14 05/30/2019 10:18 AM   GFR 74.27  12/05/2018 09:37 AM   MICROALBUR 2.2 (H) 05/30/2019 10:18 AM   MICROALBUR 2.0 (H) 04/10/2018 12:20 PM    Last diabetic Eye exam:  Lab Results  Component Value Date/Time   HMDIABEYEEXA No Retinopathy 06/19/2019 12:00 AM    Last diabetic Foot exam:  Lab Results  Component Value Date/Time   HMDIABFOOTEX done 09/20/2017 12:00 AM    Checking BG: Weekly Recent FBG Readings: 130-140  Patient has failed these meds in past: [place], [place], [place] Patient is currently uncontrolled on the following medications:   Metformin ER 750 mg BID  Jardiance 25 mg daily  Bydureon BCise 2 mg once weekly - Sun  Lantus 30 units daily   We discussed: Pt has been taking medications as prescribed; he does not endorse any specific diet, although he tries to avoid sweets. Discussed A1c goal and plan to intensify medications or lifestyle changes if A1c is not at goal at next check.  Plan  Continue current medications and control with diet and exercise   Hyperlipidemia   LDL goal < 100  Lipid Panel     Component Value Date/Time   CHOL 174 05/30/2019 1018   TRIG 136.0 05/30/2019 1018   HDL 69.60 05/30/2019 1018   CHOLHDL 2 05/30/2019 1018   VLDL 27.2 05/30/2019 1018   LDLCALC 77 05/30/2019 1018   LDLDIRECT 183.6 01/31/2011 1358    The 10-year ASCVD risk score Mikey Bussing DC Jr., et al., 2013) is: 30.7%   Values used to calculate the score:     Age: 68 years     Sex: Male     Is Non-Hispanic African American: No     Diabetic: Yes     Tobacco smoker: No     Systolic Blood Pressure: 697 mmHg     Is BP treated: Yes     HDL Cholesterol: 69.6 mg/dL     Total Cholesterol: 174 mg/dL   Patient has failed these meds in past: n/a Patient is currently controlled on the following medications:   Rosuvastatin 10 mg daily  Aspirin 81 mg daily  We discussed:  diet and exercise extensively; cholesterol goals and benefit of statin for ASCVD risk reduction.  Plan  Continue current  medications and control with diet and exercise   Medication Management   [facility] Uses pill box? Yes Pt endorses 100% compliance  We discussed[facility] with [facility], he is able to get most medications free or cheap because he still has Pharmacist, community and is able to get coupons.  Plan  Continue current medication management strategy    Follow up: 3 month phone visit  Charlene Brooke, PharmD Clinical Pharmacist [facility] [phone]

## 2019-10-16 NOTE — Patient Instructions (Addendum)
Visit Information  Phone number for Pharmacist: [phone]  Goals Addressed            This Visit's Progress   . Pharmacy Care Plan   On track    CARE PLAN ENTRY  Current Barriers:  . Chronic Disease Management support, education, and care coordination needs related to Hypertension, Hyperlipidemia, and Diabetes   Hypertension BP Readings from Last 3 Encounters:  05/30/19 136/82  12/05/18 120/80  11/21/18 138/85 .  Pharmacist Clinical Goal(s): o Over the next 90 days, patient will work with PharmD and providers to maintain BP goal < 130/80 . Current regimen:  o Telmisartan 40 mg daily . Interventions: o Discussed BP goals and benefits of telmisartan for BP control and kidney protection . Patient self care activities - Over the next 90 days, patient will: o Ensure daily salt intake < 2300 mg/day  Hyperlipidemia Lab Results  Component Value Date/Time   LDLCALC 77 05/30/2019 10:18 AM   LDLDIRECT 183.6 01/31/2011 01:58 PM .  Pharmacist Clinical Goal(s): o Over the next 90 days, patient will work with PharmD and providers to maintain LDL goal < 100 . Current regimen:  o Rosuvastatin 10 mg daily . Interventions: o Discussed benefits of statin for cholesterol control and heart disease prevention . Patient self care activities - Over the next 90 days, patient will: o Take medication as prescribed o Adhere to low cholesterol diet  Diabetes Lab Results  Component Value Date/Time   HGBA1C 7.9 (H) 05/30/2019 10:18 AM   HGBA1C 7.1 (H) 12/05/2018 09:37 AM .  Pharmacist Clinical Goal(s): o Over the next 90 days, patient will work with PharmD and providers to achieve A1c goal < 7% . Current regimen:  o Metformin ER 750 mg twice a day o Jardiance 25 mg daily o Bydureon BCise 2 mg once weekly o Lantus 30 units daily . Interventions: o Discussed importance of metformin for controlling sugars - May expect metformin to reduce A1c by ~0.5-1% o Discussed combination metformin +  Jardiance (Synjardy) . Patient self care activities - Over the next 90 days, patient will: o Check blood sugar 2-3 times weekly, document, and provide at future appointments o Contact provider with any episodes of hypoglycemia o Take medications as prescribed  Medication management . Pharmacist Clinical Goal(s): o Over the next 90 days, patient will work with PharmD and providers to achieve optimal medication adherence . [facility] . Interventions o Comprehensive medication review performed. o Continue current medication management strategy . Patient self care activities - Over the next 90 days, patient will: o Focus on medication adherence by pill box o Take medications as prescribed o Report any questions or concerns to PharmD and/or provider(s)  Please see past updates related to this goal by clicking on the "Past Updates" button in the selected goal        Patient verbalizes understanding of instructions provided today.   Telephone follow up appointment with pharmacy team member scheduled for: 3 months  Charlene Brooke, PharmD Clinical Pharmacist [facility] [phone]  Diabetes Mellitus and Nutrition, Adult When you have diabetes (diabetes mellitus), it is very important to have healthy eating habits because your blood sugar (glucose) levels are greatly affected by what you eat and drink. Eating healthy foods in the appropriate amounts, at about the same times every day, can help you:  Control your blood glucose.  Lower your risk of heart disease.  Improve your blood pressure.  Reach or maintain a healthy  weight. Every person with diabetes is different, and each person has different needs for a meal plan. Your health care provider may recommend that you work with a diet and nutrition specialist (dietitian) to make a meal plan that is best for you. Your meal plan may vary depending on factors such as:  The calories you  need.  The medicines you take.  Your weight.  Your blood glucose, blood pressure, and cholesterol levels.  Your activity level.  Other health conditions you have, such as heart or kidney disease. How do carbohydrates affect me? Carbohydrates, also called carbs, affect your blood glucose level more than any other type of food. Eating carbs naturally raises the amount of glucose in your blood. Carb counting is a method for keeping track of how many carbs you eat. Counting carbs is important to keep your blood glucose at a healthy level, especially if you use insulin or take certain oral diabetes medicines. It is important to know how many carbs you can safely have in each meal. This is different for every person. Your dietitian can help you calculate how many carbs you should have at each meal and for each snack. Foods that contain carbs include:  Bread, cereal, rice, pasta, and crackers.  Potatoes and corn.  Peas, beans, and lentils.  Milk and yogurt.  Fruit and juice.  Desserts, such as cakes, cookies, ice cream, and candy. How does alcohol affect me? Alcohol can cause a sudden decrease in blood glucose (hypoglycemia), especially if you use insulin or take certain oral diabetes medicines. Hypoglycemia can be a life-threatening condition. Symptoms of hypoglycemia (sleepiness, dizziness, and confusion) are similar to symptoms of having too much alcohol. If your health care provider says that alcohol is safe for you, follow these guidelines:  Limit alcohol intake to no more than 1 drink per day for nonpregnant women and 2 drinks per day for men. One drink equals 12 oz of beer, 5 oz of wine, or 1 oz of hard liquor.  Do not drink on an empty stomach.  Keep yourself hydrated with water, diet soda, or unsweetened iced tea.  Keep in mind that regular soda, juice, and other mixers may contain a lot of sugar and must be counted as carbs. What are tips for following this plan?  Reading  food labels  Start by checking the serving size on the "Nutrition Facts" label of packaged foods and drinks. The amount of calories, carbs, fats, and other nutrients listed on the label is based on one serving of the item. Many items contain more than one serving per package.  Check the total grams (g) of carbs in one serving. You can calculate the number of servings of carbs in one serving by dividing the total carbs by 15. For example, if a food has 30 g of total carbs, it would be equal to 2 servings of carbs.  Check the number of grams (g) of saturated and trans fats in one serving. Choose foods that have low or no amount of these fats.  Check the number of milligrams (mg) of salt (sodium) in one serving. Most people should limit total sodium intake to less than 2,300 mg per day.  Always check the nutrition information of foods labeled as "low-fat" or "nonfat". These foods may be higher in added sugar or refined carbs and should be avoided.  Talk to your dietitian to identify your daily goals for nutrients listed on the label. Shopping  Avoid buying canned, premade, or processed  foods. These foods tend to be high in fat, sodium, and added sugar.  Shop around the outside edge of the grocery store. This includes fresh fruits and vegetables, bulk grains, fresh meats, and fresh dairy. Cooking  Use low-heat cooking methods, such as baking, instead of high-heat cooking methods like deep frying.  Cook using healthy oils, such as olive, canola, or sunflower oil.  Avoid cooking with butter, cream, or high-fat meats. Meal planning  Eat meals and snacks regularly, preferably at the same times every day. Avoid going long periods of time without eating.  Eat foods high in fiber, such as fresh fruits, vegetables, beans, and whole grains. Talk to your dietitian about how many servings of carbs you can eat at each meal.  Eat 4-6 ounces (oz) of lean protein each day, such as lean meat, chicken,  fish, eggs, or tofu. One oz of lean protein is equal to: ? 1 oz of meat, chicken, or fish. ? 1 egg. ?  cup of tofu.  Eat some foods each day that contain healthy fats, such as avocado, nuts, seeds, and fish. Lifestyle  Check your blood glucose regularly.  Exercise regularly as told by your health care provider. This may include: ? 150 minutes of moderate-intensity or vigorous-intensity exercise each week. This could be brisk walking, biking, or water aerobics. ? Stretching and doing strength exercises, such as yoga or weightlifting, at least 2 times a week.  Take medicines as told by your health care provider.  Do not use any products that contain nicotine or tobacco, such as cigarettes and e-cigarettes. If you need help quitting, ask your health care provider.  Work with a Social worker or diabetes educator to identify strategies to manage stress and any emotional and social challenges. Questions to ask a health care provider  Do I need to meet with a diabetes educator?  Do I need to meet with a dietitian?  What number can I call if I have questions?  When are the best times to check my blood glucose? Where to find more information:  American Diabetes Association: diabetes.org  Academy of Nutrition and Dietetics: www.eatright.[employer] of Diabetes and Digestive and Kidney Diseases (NIH): [employer] Summary  A healthy meal plan will help you control your blood glucose and maintain a healthy lifestyle.  Working with a diet and nutrition specialist (dietitian) can help you make a meal plan that is best for you.  Keep in mind that carbohydrates (carbs) and alcohol have immediate effects on your blood glucose levels. It is important to count carbs and to use alcohol carefully. This information is not intended to replace advice given to you by your health care provider. Make sure you discuss any questions you have with your health care provider. Document  Revised: 02/10/2017 Document Reviewed: 04/04/2016 Elsevier Patient Education  2020 [employer].

## 2019-11-20 ENCOUNTER — Other Ambulatory Visit: Payer: Self-pay | Admitting: Internal Medicine

## 2019-11-20 DIAGNOSIS — E118 Type 2 diabetes mellitus with unspecified complications: Secondary | ICD-10-CM

## 2019-11-26 DIAGNOSIS — M65332 Trigger finger, left middle finger: Secondary | ICD-10-CM | POA: Diagnosis not present

## 2019-11-26 DIAGNOSIS — M65341 Trigger finger, right ring finger: Secondary | ICD-10-CM | POA: Diagnosis not present

## 2019-11-26 DIAGNOSIS — M654 Radial styloid tenosynovitis [de Quervain]: Secondary | ICD-10-CM | POA: Diagnosis not present

## 2019-11-26 DIAGNOSIS — G5603 Carpal tunnel syndrome, bilateral upper limbs: Secondary | ICD-10-CM | POA: Diagnosis not present

## 2019-11-27 ENCOUNTER — Other Ambulatory Visit: Payer: Self-pay

## 2019-11-27 ENCOUNTER — Ambulatory Visit (INDEPENDENT_AMBULATORY_CARE_PROVIDER_SITE_OTHER): Payer: Medicare Other

## 2019-11-27 VITALS — BP 120/72 | HR 87 | Temp 98.2°F | Resp 16 | Ht 70.0 in | Wt 180.4 lb

## 2019-11-27 DIAGNOSIS — Z0000 Encounter for general adult medical examination without abnormal findings: Secondary | ICD-10-CM | POA: Diagnosis not present

## 2019-11-27 NOTE — Progress Notes (Signed)
Subjective:   Clifford Brown is a 69 y.o. male who presents for Medicare Annual/Subsequent preventive examination.  Review of Systems    No ROS. Medicare Wellness Exam Cardiac Risk Factors include: advanced age (>64men, >15 women);diabetes mellitus;dyslipidemia;male gender     Objective:    Today's Vitals   11/27/19 0817  BP: 120/72  Pulse: 87  Resp: 16  Temp: 98.2 F (36.8 C)  SpO2: 97%  Weight: 180 lb 6.4 oz (81.8 kg)  Height: 5\' 10"  (1.778 m)  PainSc: 0-No pain   Body mass index is 25.88 kg/m.  Advanced Directives 11/27/2019 11/21/2018 10/23/2017 05/02/2016 09/29/2014  Does Patient Have a Medical Advance Directive? Yes No No Yes No  Type of Advance Directive Living will - - [place];Living will -  Does patient want to make changes to medical advance directive? No - Patient declined - - - -  Copy of [place] in Chart? - - - Yes -  Would patient like information on creating a medical advance directive? - No - Patient declined Yes (ED - Information included in AVS) - Yes - Educational materials given    Current Medications (verified) Outpatient Encounter Medications as of 11/27/2019  Medication Sig  . aspirin EC 81 MG tablet Take 1 tablet (81 mg total) by mouth daily.  . Azelastine-Fluticasone (DYMISTA) 137-50 MCG/ACT SUSP Place 2 puffs into the nose 2 (two) times daily.  Marland Kitchen BYDUREON BCISE 2 MG/0.85ML AUIJ INJECT 1 PEN AS DIRECTED ONCE A WEEK  . FREESTYLE LITE test strip USE AS DIRECTED 3 TIMES A DAY  . insulin glargine (LANTUS SOLOSTAR) 100 UNIT/ML Solostar Pen Inject 30 Units into the skin daily.  . Insulin Pen Needle (B-D ULTRAFINE III SHORT PEN) 31G X 8 MM MISC USE DAILY WITH INSULIN  . JARDIANCE 25 MG TABS tablet TAKE 1 TABLET BY MOUTH EVERY DAY  . metFORMIN (GLUCOPHAGE-XR) 750 MG 24 hr tablet TAKE 2 TABLETS BY MOUTH DAILY WITH BREAKFAST.  . rosuvastatin (CRESTOR) 10 MG tablet TAKE 1 TABLET BY MOUTH EVERY DAY  . telmisartan  (MICARDIS) 40 MG tablet TAKE 1 TABLET BY MOUTH EVERY DAY  . [DISCONTINUED] Exenatide ER (BYDUREON BCISE) 2 MG/0.85ML AUIJ Inject 1 Act into the skin once a week.  . [DISCONTINUED] Insulin Glargine (LANTUS SOLOSTAR) 100 UNIT/ML Solostar Pen Inject 30 Units into the skin daily.  . [DISCONTINUED] JARDIANCE 25 MG TABS tablet TAKE 1 TABLET BY MOUTH EVERY DAY  . [DISCONTINUED] metFORMIN (GLUCOPHAGE-XR) 750 MG 24 hr tablet TAKE 2 TABLETS BY MOUTH DAILY WITH BREAKFAST.  . [DISCONTINUED] rosuvastatin (CRESTOR) 10 MG tablet TAKE 1 TABLET BY MOUTH EVERY DAY  . [DISCONTINUED] telmisartan (MICARDIS) 40 MG tablet TAKE 1 TABLET BY MOUTH EVERY DAY   No facility-administered encounter medications on file as of 11/27/2019.    Allergies (verified) Patient has no known allergies.   History: Past Medical History:  Diagnosis Date  . Cancer [facility])    Prostate - Dr Alinda Money   . Depression   . Diabetes mellitus without complication ([place])   . Hyperlipidemia   . Hypertension    Past Surgical History:  Procedure Laterality Date  . PROSTATE SURGERY     Family History  Problem Relation Age of Onset  . Alcohol abuse Other   . Cancer Other        Prostate  . Stroke Neg Hx   . Kidney disease Neg Hx   . Hypertension Neg Hx   . Hyperlipidemia Neg Hx   .  Heart disease Neg Hx   . Early death Neg Hx   . Diabetes Neg Hx    Social History   Socioeconomic History  . Marital status: Married    Spouse name: Not on file  . Number of children: 2  . Years of education: Not on file  . Highest education level: Not on file  Occupational History  . Occupation: [employer] part-time  Tobacco Use  . Smoking status: Never Smoker  . Smokeless tobacco: Never Used  Vaping Use  . Vaping Use: Never used  Substance and Sexual Activity  . Alcohol use: Yes    Alcohol/week: 21.0 standard drinks    Types: 21 Cans of beer per week    Comment: couple beers on day off  . Drug use: No  . Sexual activity: Yes  Other Topics  Concern  . Not on file  Social History Narrative  . Not on file   Social Determinants of Health   Financial Resource Strain: Low Risk   . Difficulty of Paying Living Expenses: Not hard at all  Food Insecurity: No Food Insecurity  . Worried About Charity fundraiser in the Last Year: Never true  . Ran Out of Food in the Last Year: Never true  Transportation Needs: No Transportation Needs  . Lack of Transportation (Medical): No  . Lack of Transportation (Non-Medical): No  Physical Activity: Sufficiently Active  . Days of Exercise per Week: 5 days  . Minutes of Exercise per Session: 30 min  Stress: No Stress Concern Present  . Feeling of Stress : Not at all  Social Connections: Socially Integrated  . Frequency of Communication with Friends and Family: More than three times a week  . Frequency of Social Gatherings with Friends and Family: More than three times a week  . Attends Religious Services: More than 4 times per year  . Active Member of Clubs or Organizations: Yes  . Attends Archivist Meetings: More than 4 times per year  . Marital Status: Married    Tobacco Counseling Counseling given: Not Answered   Clinical Intake:  Pre-visit preparation completed: Yes  Pain : No/denies pain Pain Score: 0-No pain     BMI - recorded: 25.88 Nutritional Status: BMI 25 -29 Overweight Nutritional Risks: None Diabetes: Yes CBG done?: No Did pt. bring in CBG monitor from home?: No  How often do you need to have someone help you when you read instructions, pamphlets, or other written materials from your doctor or pharmacy?: 1 - Never What is the last grade level you completed in school?: HSG  Diabetic? yes  Interpreter Needed?: No  Information entered by :: Zelia Yzaguirre N. Mccauley Diehl, LPN   Activities of Daily Living In your present state of health, do you have any difficulty performing the following activities: 11/27/2019  Hearing? N  Vision? N  Difficulty  concentrating or making decisions? N  Walking or climbing stairs? N  Dressing or bathing? N  Doing errands, shopping? N  Preparing Food and eating ? N  Using the Toilet? N  In the past six months, have you accidently leaked urine? N  Do you have problems with loss of bowel control? N  Managing your Medications? N  Managing your Finances? N  Housekeeping or managing your Housekeeping? N  Some recent data might be hidden    Patient Care Team: [name], MD as PCP - General Folt[place]ki, Cleaster Corin, [facility] as Pharmacist (Pharmacist)  Indicate any recent Medical Services you may  have received from other than [facility] providers in the past year (date may be approximate).     Assessment:   This is a routine wellness examination for Rue.  Hearing/Vision screen No exam data present  Dietary issues and exercise activities discussed: Current Exercise Habits: Home exercise routine, Type of exercise: walking, Time (Minutes): 30, Frequency (Times/Week): 5, Weekly Exercise (Minutes/Week): 150, Intensity: Moderate, Exercise limited by: None identified  Goals    . Patient Stated     Continue to lose weight by getting back to going to [employer], ride my bike in the neighborhood, walk the dogs, and watching my diet for carbohydrates.    . Pharmacy Care Plan     CARE PLAN ENTRY  Current Barriers:  . Chronic Disease Management support, education, and care coordination needs related to Hypertension, Hyperlipidemia, and Diabetes   Hypertension BP Readings from Last 3 Encounters:  05/30/19 136/82  12/05/18 120/80  11/21/18 138/85 .  Pharmacist Clinical Goal(s): o Over the next 90 days, patient will work with PharmD and providers to maintain BP goal < 130/80 . Current regimen:  o Telmisartan 40 mg daily . Interventions: o Discussed BP goals and benefits of telmisartan for BP control and kidney protection . Patient self care activities - Over the next 90 days, patient will: o Ensure  daily salt intake < 2300 mg/day  Hyperlipidemia Lab Results  Component Value Date/Time   LDLCALC 77 05/30/2019 10:18 AM   LDLDIRECT 183.6 01/31/2011 01:58 PM .  Pharmacist Clinical Goal(s): o Over the next 90 days, patient will work with PharmD and providers to maintain LDL goal < 100 . Current regimen:  o Rosuvastatin 10 mg daily . Interventions: o Discussed benefits of statin for cholesterol control and heart disease prevention . Patient self care activities - Over the next 90 days, patient will: o Take medication as prescribed o Adhere to low cholesterol diet  Diabetes Lab Results  Component Value Date/Time   HGBA1C 7.9 (H) 05/30/2019 10:18 AM   HGBA1C 7.1 (H) 12/05/2018 09:37 AM .  Pharmacist Clinical Goal(s): o Over the next 90 days, patient will work with PharmD and providers to achieve A1c goal < 7% . Current regimen:  o Metformin ER 750 mg twice a day o Jardiance 25 mg daily o Bydureon BCise 2 mg once weekly o Lantus 30 units daily . Interventions: o Discussed importance of metformin for controlling sugars - May expect metformin to reduce A1c by ~0.5-1% o Discussed combination metformin + Jardiance (Synjardy) . Patient self care activities - Over the next 90 days, patient will: o Check blood sugar 2-3 times weekly, document, and provide at future appointments o Contact provider with any episodes of hypoglycemia o Take medications as prescribed  Medication management . Pharmacist Clinical Goal(s): o Over the next 90 days, patient will work with PharmD and providers to achieve optimal medication adherence . [facility] . Interventions o Comprehensive medication review performed. o Continue current medication management strategy . Patient self care activities - Over the next 90 days, patient will: o Focus on medication adherence by pill box o Take medications as prescribed o Report any questions or concerns to PharmD and/or provider(s)  Please see past  updates related to this goal by clicking on the "Past Updates" button in the selected goal        Depression Screen PHQ 2/9 Scores 11/27/2019 11/21/2018 04/10/2018 10/23/2017 05/02/2016 09/29/2014 01/08/2013  PHQ - 2 Score 0 1 0 0 0 0 0  PHQ-  9 Score - 3 0 1 - - -    Fall Risk Fall Risk  11/27/2019 11/21/2018 05/18/2018 04/10/2018 10/23/2017  Falls in the past year? 0 0 0 0 No  Comment - - - - -  Number falls in past yr: 0 0 - 0 -  Injury with Fall? 0 0 - 0 -  Risk for fall due to : No Fall Risks - - - -  Follow up Falls evaluation completed - - Falls evaluation completed -    Any stairs in or around the home? Yes  If so, are there any without handrails? No  Home free of loose throw rugs in walkways, pet beds, electrical cords, etc? Yes  Adequate lighting in your home to reduce risk of falls? Yes   ASSISTIVE DEVICES UTILIZED TO PREVENT FALLS:  Life alert? No  Use of a cane, walker or w/c? No  Grab bars in the bathroom? No  Shower chair or bench in shower? No  Elevated toilet seat or a handicapped toilet? No   TIMED UP AND GO:  Was the test performed? No .  Length of time to ambulate 10 feet: 0 sec.   Gait steady and fast without use of assistive device  Cognitive Function:     6CIT Screen 11/27/2019  What Year? 0 points  What month? 0 points  What time? 0 points  Count back from 20 0 points  Months in reverse 0 points  Repeat phrase 0 points  Total Score 0    Immunizations Immunization History  Administered Date(s) Administered  . Fluad Quad(high Dose 65+) 11/21/2018  . Influenza Whole 12/28/2011, 01/03/2013  . Influenza, High Dose Seasonal PF 02/08/2017, 12/04/2017  . Influenza,inj,Quad PF,6+ Mos 11/25/2014  . PFIZER SARS-COV-2 Vaccination 05/10/2019, 06/04/2019  . Pneumococcal Conjugate-13 08/21/2014  . Pneumococcal Polysaccharide-23 01/08/2013, 05/16/2018  . Td 01/12/2010  . Zoster 08/21/2014    TDAP status: Up to date Flu Vaccine status: Up to  date Pneumococcal vaccine status: Up to date Covid-19 vaccine status: Completed vaccines  Qualifies for Shingles Vaccine? Yes   Zostavax completed Yes   Shingrix Completed?: No.    Education has been provided regarding the importance of this vaccine. Patient has been advised to call insurance company to determine out of pocket expense if they have not yet received this vaccine. Advised may also receive vaccine at local pharmacy or Health Dept. Verbalized acceptance and understanding.  Screening Tests Health Maintenance  Topic Date Due  . INFLUENZA VACCINE  10/13/2019  . FOOT EXAM  11/21/2019  . HEMOGLOBIN A1C  11/30/2019  . TETANUS/TDAP  01/13/2020  . OPHTHALMOLOGY EXAM  06/18/2020  . Fecal DNA (Cologuard)  04/23/2021  . COVID-19 Vaccine  Completed  . Hepatitis C Screening  Completed  . PNA vac Low Risk Adult  Completed    Health Maintenance  Health Maintenance Due  Topic Date Due  . INFLUENZA VACCINE  10/13/2019  . FOOT EXAM  11/21/2019    Colorectal cancer screening: Completed 04/23/2018. Repeat every 3 years  Lung Cancer Screening: (Low Dose CT Chest recommended if Age 13-80 years, 30 pack-year currently smoking OR have quit w/in 15years.) does not qualify.   Lung Cancer Screening Referral: no  Additional Screening:  Hepatitis C Screening: does qualify; Completed: yes  Vision Screening: Recommended annual ophthalmology exams for early detection of glaucoma and other disorders of the eye. Is the patient up to date with their annual eye exam?  Yes  Who is the provider or what  is the name of the office in which the patient attends annual eye exams? [facility] If pt is not established with a provider, would they like to be referred to a provider to establish care? No .   Dental Screening: Recommended annual dental exams for proper oral hygiene  Community Resource Referral / Chronic Care Management: CRR required this visit?  No   CCM required this visit?  No       Plan:     I have personally reviewed and noted the following in the patient's chart:   . Medical and social history . Use of alcohol, tobacco or illicit drugs  . Current medications and supplements . Functional ability and status . Nutritional status . Physical activity . Advanced directives . List of other physicians . Hospitalizations, surgeries, and ER visits in previous 12 months . Vitals . Screenings to include cognitive, depression, and falls . Referrals and appointments  In addition, I have reviewed and discussed with patient certain preventive protocols, quality metrics, and best practice recommendations. A written personalized care plan for preventive services as well as general preventive health recommendations were provided to patient.     Sheral Flow, LPN   03/23/3157   Nurse Notes: n/a

## 2019-11-27 NOTE — Patient Instructions (Signed)
Clifford Brown , Thank you for taking time to come for your Medicare Wellness Visit. I appreciate your ongoing commitment to your health goals. Please review the following plan we discussed and let me know if I can assist you in the future.   Screening recommendations/referrals: Colonoscopy: 04/23/2018; due every 3 years (Cologuard) Recommended yearly ophthalmology/optometry visit for glaucoma screening and checkup Recommended yearly dental visit for hygiene and checkup  Vaccinations: Influenza vaccine: 11/21/2018 Pneumococcal vaccine: completed Tdap vaccine: 01/12/2010; due every 10 years Shingles vaccine: never done   Covid-19: completed  Advanced directives: Please bring a copy of your health care power of attorney and living will to the office at your convenience.  Conditions/risks identified: Yes; Reviewed health maintenance screenings with patient today and relevant education, vaccines, and/or referrals were provided. Please continue to do your personal lifestyle choices by: daily care of teeth and gums, regular physical activity (goal should be 5 days a week for 30 minutes), eat a healthy diet, avoid tobacco and drug use, limiting any alcohol intake, taking a low-dose aspirin (if not allergic or have been advised by your provider otherwise) and taking vitamins and minerals as recommended by your provider. Continue doing brain stimulating activities (puzzles, reading, adult coloring books, staying active) to keep memory sharp. Continue to eat heart healthy diet (full of fruits, vegetables, whole grains, lean protein, water--limit salt, fat, and sugar intake) and increase physical activity as tolerated.  Next appointment: Please schedule your next Medicare Wellness Visit with your Nurse Health Advisor in 1 year.  Preventive Care 40 Years and Older, Male Preventive care refers to lifestyle choices and visits with your health care provider that can promote health and wellness. What does preventive  care include?  A yearly physical exam. This is also called an annual well check.  Dental exams once or twice a year.  Routine eye exams. Ask your health care provider how often you should have your eyes checked.  Personal lifestyle choices, including:  Daily care of your teeth and gums.  Regular physical activity.  Eating a healthy diet.  Avoiding tobacco and drug use.  Limiting alcohol use.  Practicing safe sex.  Taking low doses of aspirin every day.  Taking vitamin and mineral supplements as recommended by your health care provider. What happens during an annual well check? The services and screenings done by your health care provider during your annual well check will depend on your age, overall health, lifestyle risk factors, and family history of disease. Counseling  Your health care provider may ask you questions about your:  Alcohol use.  Tobacco use.  Drug use.  Emotional well-being.  Home and relationship well-being.  Sexual activity.  Eating habits.  History of falls.  Memory and ability to understand (cognition).  Work and work Statistician. Screening  You may have the following tests or measurements:  Height, weight, and BMI.  Blood pressure.  Lipid and cholesterol levels. These may be checked every 5 years, or more frequently if you are over 22 years old.  Skin check.  Lung cancer screening. You may have this screening every year starting at age 86 if you have a 30-pack-year history of smoking and currently smoke or have quit within the past 15 years.  Fecal occult blood test (FOBT) of the stool. You may have this test every year starting at age 91.  Flexible sigmoidoscopy or colonoscopy. You may have a sigmoidoscopy every 5 years or a colonoscopy every 10 years starting at age 8.  Prostate  cancer screening. Recommendations will vary depending on your family history and other risks.  Hepatitis C blood test.  Hepatitis B blood  test.  Sexually transmitted disease (STD) testing.  Diabetes screening. This is done by checking your blood sugar (glucose) after you have not eaten for a while (fasting). You may have this done every 1-3 years.  Abdominal aortic aneurysm (AAA) screening. You may need this if you are a current or former smoker.  Osteoporosis. You may be screened starting at age 81 if you are at high risk. Talk with your health care provider about your test results, treatment options, and if necessary, the need for more tests. Vaccines  Your health care provider may recommend certain vaccines, such as:  Influenza vaccine. This is recommended every year.  Tetanus, diphtheria, and acellular pertussis (Tdap, Td) vaccine. You may need a Td booster every 10 years.  Zoster vaccine. You may need this after age 51.  Pneumococcal 13-valent conjugate (PCV13) vaccine. One dose is recommended after age 24.  Pneumococcal polysaccharide (PPSV23) vaccine. One dose is recommended after age 13. Talk to your health care provider about which screenings and vaccines you need and how often you need them. This information is not intended to replace advice given to you by your health care provider. Make sure you discuss any questions you have with your health care provider. Document Released: 03/27/2015 Document Revised: 11/18/2015 Document Reviewed: 12/30/2014 Elsevier Interactive Patient Education  2017 [place] Prevention in the Home Falls can cause injuries. They can happen to people of all ages. There are many things you can do to make your home safe and to help prevent falls. What can I do on the outside of my home?  Regularly fix the edges of walkways and driveways and fix any cracks.  Remove anything that might make you trip as you walk through a door, such as a raised step or threshold.  Trim any bushes or trees on the path to your home.  Use bright outdoor lighting.  Clear any walking paths of  anything that might make someone trip, such as rocks or tools.  Regularly check to see if handrails are loose or broken. Make sure that both sides of any steps have handrails.  Any raised decks and porches should have guardrails on the edges.  Have any leaves, snow, or ice cleared regularly.  Use sand or salt on walking paths during winter.  Clean up any spills in your garage right away. This includes oil or grease spills. What can I do in the bathroom?  Use night lights.  Install grab bars by the toilet and in the tub and shower. Do not use towel bars as grab bars.  Use non-skid mats or decals in the tub or shower.  If you need to sit down in the shower, use a plastic, non-slip stool.  Keep the floor dry. Clean up any water that spills on the floor as soon as it happens.  Remove soap buildup in the tub or shower regularly.  Attach bath mats securely with double-sided non-slip rug tape.  Do not have throw rugs and other things on the floor that can make you trip. What can I do in the bedroom?  Use night lights.  Make sure that you have a light by your bed that is easy to reach.  Do not use any sheets or blankets that are too big for your bed. They should not hang down onto the floor.  Have a  firm chair that has side arms. You can use this for support while you get dressed.  Do not have throw rugs and other things on the floor that can make you trip. What can I do in the kitchen?  Clean up any spills right away.  Avoid walking on wet floors.  Keep items that you use a lot in easy-to-reach places.  If you need to reach something above you, use a strong step stool that has a grab bar.  Keep electrical cords out of the way.  Do not use floor polish or wax that makes floors slippery. If you must use wax, use non-skid floor wax.  Do not have throw rugs and other things on the floor that can make you trip. What can I do with my stairs?  Do not leave any items on the  stairs.  Make sure that there are handrails on both sides of the stairs and use them. Fix handrails that are broken or loose. Make sure that handrails are as long as the stairways.  Check any carpeting to make sure that it is firmly attached to the stairs. Fix any carpet that is loose or worn.  Avoid having throw rugs at the top or bottom of the stairs. If you do have throw rugs, attach them to the floor with carpet tape.  Make sure that you have a light switch at the top of the stairs and the bottom of the stairs. If you do not have them, ask someone to add them for you. What else can I do to help prevent falls?  Wear shoes that:  Do not have high heels.  Have rubber bottoms.  Are comfortable and fit you well.  Are closed at the toe. Do not wear sandals.  If you use a stepladder:  Make sure that it is fully opened. Do not climb a closed stepladder.  Make sure that both sides of the stepladder are locked into place.  Ask someone to hold it for you, if possible.  Clearly mark and make sure that you can see:  Any grab bars or handrails.  First and last steps.  Where the edge of each step is.  Use tools that help you move around (mobility aids) if they are needed. These include:  Canes.  Walkers.  Scooters.  Crutches.  Turn on the lights when you go into a dark area. Replace any light bulbs as soon as they burn out.  Set up your furniture so you have a clear path. Avoid moving your furniture around.  If any of your floors are uneven, fix them.  If there are any pets around you, be aware of where they are.  Review your medicines with your doctor. Some medicines can make you feel dizzy. This can increase your chance of falling. Ask your doctor what other things that you can do to help prevent falls. This information is not intended to replace advice given to you by your health care provider. Make sure you discuss any questions you have with your health care  provider. Document Released: 12/25/2008 Document Revised: 08/06/2015 Document Reviewed: 04/04/2014 Elsevier Interactive Patient Education  2017 [employer].

## 2019-12-12 ENCOUNTER — Other Ambulatory Visit: Payer: Self-pay

## 2019-12-12 ENCOUNTER — Ambulatory Visit (INDEPENDENT_AMBULATORY_CARE_PROVIDER_SITE_OTHER): Payer: Medicare Other | Admitting: Internal Medicine

## 2019-12-12 ENCOUNTER — Encounter: Payer: Self-pay | Admitting: Internal Medicine

## 2019-12-12 VITALS — BP 136/86 | HR 84 | Temp 98.1°F | Resp 16 | Ht 70.0 in | Wt 176.0 lb

## 2019-12-12 DIAGNOSIS — E118 Type 2 diabetes mellitus with unspecified complications: Secondary | ICD-10-CM

## 2019-12-12 DIAGNOSIS — I10 Essential (primary) hypertension: Secondary | ICD-10-CM

## 2019-12-12 DIAGNOSIS — Z23 Encounter for immunization: Secondary | ICD-10-CM | POA: Insufficient documentation

## 2019-12-12 DIAGNOSIS — E039 Hypothyroidism, unspecified: Secondary | ICD-10-CM | POA: Diagnosis not present

## 2019-12-12 MED ORDER — SHINGRIX 50 MCG/0.5ML IM SUSR: 0.5000 mL | Freq: Once | INTRAMUSCULAR | 1 refills | Status: AC

## 2019-12-12 NOTE — Progress Notes (Signed)
Subjective:  Patient ID: Clifford Brown, male    DOB: 1951/01/12  Age: 69 y.o. MRN: [id number]  CC: Hypertension, Diabetes, and Hypothyroidism  This visit occurred during the SARS-CoV-2 public health emergency.  Safety protocols were in place, including screening questions prior to the visit, additional usage of staff PPE, and extensive cleaning of exam room while observing appropriate contact time as indicated for disinfecting solutions.    HPI Clifford Brown presents for f/up - he has had trouble with compliance recently.  He may have recently run out of insulin as well as the SGLT2 inhibitor.  He tells me he thinks he is using the GLP-1 agonist.  He does not monitor his blood sugar but denies polys and thinks his blood sugars have been well controlled.  Outpatient Medications Prior to Visit  Medication Sig Dispense Refill  . aspirin EC 81 MG tablet Take 1 tablet (81 mg total) by mouth daily. 90 tablet 3  . BYDUREON BCISE 2 MG/0.85ML AUIJ INJECT 1 PEN AS DIRECTED ONCE A WEEK 12 mL 1  . FREESTYLE LITE test strip USE AS DIRECTED 3 TIMES A DAY 300 each 3  . Insulin Pen Needle (B-D ULTRAFINE III SHORT PEN) 31G X 8 MM MISC USE DAILY WITH INSULIN 100 each 1  . Azelastine-Fluticasone (DYMISTA) 137-50 MCG/ACT SUSP Place 2 puffs into the nose 2 (two) times daily. 23 g 11  . insulin glargine (LANTUS SOLOSTAR) 100 UNIT/ML Solostar Pen Inject 30 Units into the skin daily. 45 mL 1  . JARDIANCE 25 MG TABS tablet TAKE 1 TABLET BY MOUTH EVERY DAY 90 tablet 1  . metFORMIN (GLUCOPHAGE-XR) 750 MG 24 hr tablet TAKE 2 TABLETS BY MOUTH DAILY WITH BREAKFAST. 180 tablet 1  . rosuvastatin (CRESTOR) 10 MG tablet TAKE 1 TABLET BY MOUTH EVERY DAY 90 tablet 1  . telmisartan (MICARDIS) 40 MG tablet TAKE 1 TABLET BY MOUTH EVERY DAY 90 tablet 1   No facility-administered medications prior to visit.    ROS Review of Systems  Constitutional: Negative for appetite change, diaphoresis and fatigue.  HENT: Negative.     Eyes: Negative for visual disturbance.  Respiratory: Negative for cough, chest tightness, shortness of breath and wheezing.   Cardiovascular: Negative for chest pain, palpitations and leg swelling.  Gastrointestinal: Negative for abdominal pain, constipation, diarrhea, nausea and vomiting.  Endocrine: Negative for cold intolerance, heat intolerance, polydipsia, polyphagia and polyuria.  Genitourinary: Negative for difficulty urinating.  Musculoskeletal: Negative for arthralgias and myalgias.  Skin: Negative.  Negative for color change.  Neurological: Negative.  Negative for dizziness, weakness and light-headedness.  Hematological: Negative for adenopathy. Does not bruise/bleed easily.  Psychiatric/Behavioral: Negative.     Objective:  BP 136/86 (BP Location: Left Arm, Patient Position: Sitting, Cuff Size: Normal)   Pulse 84   Temp 98.1 F (36.7 C) (Oral)   Resp 16   Ht 5\' 10"  (1.778 m)   Wt 176 lb (79.8 kg)   SpO2 98%   BMI 25.25 kg/m   BP Readings from Last 3 Encounters:  12/12/19 136/86  11/27/19 120/72  05/30/19 136/82    Wt Readings from Last 3 Encounters:  12/12/19 176 lb (79.8 kg)  11/27/19 180 lb 6.4 oz (81.8 kg)  05/30/19 178 lb 8 oz (81 kg)    Physical Exam Vitals reviewed.  HENT:     Nose: Nose normal.     Mouth/Throat:     Mouth: Mucous membranes are moist.  Eyes:     General: No scleral icterus.  Conjunctiva/sclera: Conjunctivae normal.  Cardiovascular:     Rate and Rhythm: Normal rate and regular rhythm.     Heart sounds: No murmur heard.   Pulmonary:     Effort: Pulmonary effort is normal.     Breath sounds: No stridor. No wheezing, rhonchi or rales.  Abdominal:     General: Abdomen is protuberant. Bowel sounds are normal. There is no distension.     Palpations: Abdomen is soft. There is no fluid wave, hepatomegaly or mass.     Tenderness: There is no abdominal tenderness.  Musculoskeletal:        General: Normal range of motion.      Cervical back: Neck supple.     Right lower leg: No edema.  Lymphadenopathy:     Cervical: No cervical adenopathy.  Skin:    General: Skin is warm and dry.  Neurological:     General: No focal deficit present.     Mental Status: He is alert.  Psychiatric:        Mood and Affect: Mood normal.        Behavior: Behavior normal.     Lab Results  Component Value Date   WBC 5.5 05/30/2019   HGB 15.6 05/30/2019   HCT 46.6 05/30/2019   PLT 211.0 05/30/2019   GLUCOSE 155 (H) 12/12/2019   CHOL 174 05/30/2019   TRIG 136.0 05/30/2019   HDL 69.60 05/30/2019   LDLDIRECT 183.6 01/31/2011   LDLCALC 77 05/30/2019   ALT 23 05/30/2019   AST 18 05/30/2019   NA 139 12/12/2019   K 4.5 12/12/2019   CL 105 12/12/2019   CREATININE 1.08 12/12/2019   BUN 21 12/12/2019   CO2 23 12/12/2019   TSH 4.40 12/12/2019   PSA 0.015 11/23/2018   HGBA1C 8.8 (H) 12/12/2019   MICROALBUR 2.2 (H) 05/30/2019    DG Chest 2 View  Result Date: 04/10/2018 CLINICAL DATA:  Cough EXAM: CHEST - 2 VIEW COMPARISON:  12/03/2008 FINDINGS: Heart and mediastinal contours are within normal limits. No focal opacities or effusions. No acute bony abnormality. IMPRESSION: No active cardiopulmonary disease. Electronically Signed   By: Rolm Baptise M.D.   On: 04/10/2018 17:15    Assessment & Plan:   Clifford Brown was seen today for hypertension, diabetes and hypothyroidism.  Diagnoses and all orders for this visit:  Essential hypertension, benign- his blood pressure is well controlled.  Electrolytes and renal function are normal. -     BASIC METABOLIC PANEL WITH GFR; Future -     BASIC METABOLIC PANEL WITH GFR  Acquired hypothyroidism- his TSH is very slightly elevated but clinically he appears euthyroid.  I do not think thyroid replacement therapy is indicated. -     TSH; Future -     TSH  Type II diabetes mellitus with manifestations ([place])- his A1c is up to 8.8%.  He agrees to be more compliant with the SGLT2 inhibitor.  He  will stay on the current dose of the GLP-1 agonist.  I recommended that he increase the dose of the basal insulin to 50 units a day. -     HM Diabetes Foot Exam -     Hemoglobin A1c; Future -     Hemoglobin A1c -     empagliflozin (JARDIANCE) 25 MG TABS tablet; Take 1 tablet (25 mg total) by mouth daily. -     insulin glargine (LANTUS SOLOSTAR) 100 UNIT/ML Solostar Pen; Inject 50 Units into the skin daily.  Need for shingles vaccine -  Zoster Vaccine Adjuvanted [facility]) injection; Inject 0.5 mLs into the muscle once for 1 dose.   I have discontinued Clifford Brown "Gene"'s Azelastine-Fluticasone. I have changed his Jardiance to empagliflozin. I have also changed his Lantus SoloStar. Additionally, I am having him start on Shingrix. Lastly, I am having him maintain his aspirin EC, FREESTYLE LITE, B-D ULTRAFINE III SHORT PEN, and Bydureon BCise.  Meds ordered this encounter  Medications  . Zoster Vaccine Adjuvanted [facility]) injection    Sig: Inject 0.5 mLs into the muscle once for 1 dose.    Dispense:  0.5 mL    Refill:  1  . empagliflozin (JARDIANCE) 25 MG TABS tablet    Sig: Take 1 tablet (25 mg total) by mouth daily.    Dispense:  90 tablet    Refill:  1  . insulin glargine (LANTUS SOLOSTAR) 100 UNIT/ML Solostar Pen    Sig: Inject 50 Units into the skin daily.    Dispense:  45 mL    Refill:  1     Follow-up: Return in about 6 months (around 06/10/2020).  [name], MD

## 2019-12-12 NOTE — Patient Instructions (Signed)
Type 2 Diabetes Mellitus, Diagnosis, Adult Type 2 diabetes (type 2 diabetes mellitus) is a long-term (chronic) disease. In type 2 diabetes, one or both of these problems may be present:  The pancreas does not make enough of a hormone called insulin.  Cells in the body do not respond properly to insulin that the body makes (insulin resistance). Normally, insulin allows blood sugar (glucose) to enter cells in the body. The cells use glucose for energy. Insulin resistance or lack of insulin causes excess glucose to build up in the blood instead of going into cells. As a result, high blood glucose (hyperglycemia) develops. What increases the risk? The following factors may make you more likely to develop type 2 diabetes:  Having a family member with type 2 diabetes.  Being overweight or obese.  Having an inactive (sedentary) lifestyle.  Having been diagnosed with insulin resistance.  Having a history of prediabetes, gestational diabetes, or polycystic ovary syndrome (PCOS).  Being of American-Indian, African-American, Hispanic/Latino, or Asian/Pacific Islander descent. What are the signs or symptoms? In the early stage of this condition, you may not have symptoms. Symptoms develop slowly and may include:  Increased thirst (polydipsia).  Increased hunger(polyphagia).  Increased urination (polyuria).  Increased urination during the night (nocturia).  Unexplained weight loss.  Frequent infections that keep coming back (recurring).  Fatigue.  Weakness.  Vision changes, such as blurry vision.  Cuts or bruises that are slow to heal.  Tingling or numbness in the hands or feet.  Dark patches on the skin (acanthosis nigricans). How is this diagnosed? This condition is diagnosed based on your symptoms, your medical history, a physical exam, and your blood glucose level. Your blood glucose may be checked with one or more of the following blood tests:  A fasting blood glucose (FBG)  test. You will not be allowed to eat (you will fast) for 8 hours or longer before a blood sample is taken.  A random blood glucose test. This test checks blood glucose at any time of day regardless of when you ate.  An A1c (hemoglobin A1c) blood test. This test provides information about blood glucose control over the previous 2-3 months.  An oral glucose tolerance test (OGTT). This test measures your blood glucose at two times: ? After fasting. This is your baseline blood glucose level. ? Two hours after drinking a beverage that contains glucose. You may be diagnosed with type 2 diabetes if:  Your FBG level is 126 mg/dL (7.0 mmol/L) or higher.  Your random blood glucose level is 200 mg/dL (11.1 mmol/L) or higher.  Your A1c level is 6.5% or higher.  Your OGTT result is higher than 200 mg/dL (11.1 mmol/L). These blood tests may be repeated to confirm your diagnosis. How is this treated? Your treatment may be managed by a specialist called an endocrinologist. Type 2 diabetes may be treated by following instructions from your health care provider about:  Making diet and lifestyle changes. This may include: ? Following an individualized nutrition plan that is developed by a diet and nutrition specialist (registered dietitian). ? Exercising regularly. ? Finding ways to manage stress.  Checking your blood glucose level as often as told.  Taking diabetes medicines or insulin daily. This helps to keep your blood glucose levels in the healthy range. ? If you use insulin, you may need to adjust the dosage depending on how physically active you are and what foods you eat. Your health care provider will tell you how to adjust your dosage.    Taking medicines to help prevent complications from diabetes, such as: ? Aspirin. ? Medicine to lower cholesterol. ? Medicine to control blood pressure. Your health care provider will set individualized treatment goals for you. Your goals will be based on  your age, other medical conditions you have, and how you respond to diabetes treatment. Generally, the goal of treatment is to maintain the following blood glucose levels:  Before meals (preprandial): 80-130 mg/dL (4.4-7.2 mmol/L).  After meals (postprandial): below 180 mg/dL (10 mmol/L).  A1c level: less than 7%. Follow these instructions at home: Questions to ask your health care provider  Consider asking the following questions: ? Do I need to meet with a diabetes educator? ? Where can I find a support group for people with diabetes? ? What equipment will I need to manage my diabetes at home? ? What diabetes medicines do I need, and when should I take them? ? How often do I need to check my blood glucose? ? What number can I call if I have questions? ? When is my next appointment? General instructions  Take over-the-counter and prescription medicines only as told by your health care provider.  Keep all follow-up visits as told by your health care provider. This is important.  For more information about diabetes, visit: ? American Diabetes Association (ADA): www.diabetes.org ? American Association of Diabetes Educators (AADE): www.diabeteseducator.org Contact a health care provider if:  Your blood glucose is at or above 240 mg/dL (13.3 mmol/L) for 2 days in a row.  You have been sick or have had a fever for 2 days or longer, and you are not getting better.  You have any of the following problems for more than 6 hours: ? You cannot eat or drink. ? You have nausea and vomiting. ? You have diarrhea. Get help right away if:  Your blood glucose is lower than 54 mg/dL (3.0 mmol/L).  You become confused or you have trouble thinking clearly.  You have difficulty breathing.  You have moderate or large ketone levels in your urine. Summary  Type 2 diabetes (type 2 diabetes mellitus) is a long-term (chronic) disease. In type 2 diabetes, the pancreas does not make enough of a  hormone called insulin, or cells in the body do not respond properly to insulin that the body makes (insulin resistance).  This condition is treated by making diet and lifestyle changes and taking diabetes medicines or insulin.  Your health care provider will set individualized treatment goals for you. Your goals will be based on your age, other medical conditions you have, and how you respond to diabetes treatment.  Keep all follow-up visits as told by your health care provider. This is important. This information is not intended to replace advice given to you by your health care provider. Make sure you discuss any questions you have with your health care provider. Document Revised: 04/28/2017 Document Reviewed: 04/03/2015 Elsevier Patient Education  2020 [employer]  

## 2019-12-13 ENCOUNTER — Encounter: Payer: Self-pay | Admitting: Internal Medicine

## 2019-12-13 LAB — HEMOGLOBIN A1C
Hgb A1c MFr Bld: 8.8 % of total Hgb — ABNORMAL HIGH (ref <5.7)
Mean Plasma Glucose: 206 (calc)
eAG (mmol/L): 11.4 (calc)

## 2019-12-13 LAB — BASIC METABOLIC PANEL WITH GFR
BUN: 21 mg/dL (ref 7–25)
CO2: 23 mmol/L (ref 20–32)
Calcium: 10.1 mg/dL (ref 8.6–10.3)
Chloride: 105 mmol/L (ref 98–110)
Creat: 1.08 mg/dL (ref 0.70–1.25)
GFR, Est African American: 81 mL/min/{1.73_m2} (ref >=60)
GFR, Est Non African American: 70 mL/min/{1.73_m2} (ref >=60)
Glucose, Bld: 155 mg/dL — ABNORMAL HIGH (ref 65–99)
Potassium: 4.5 mmol/L (ref 3.5–5.3)
Sodium: 139 mmol/L (ref 135–146)

## 2019-12-13 LAB — TSH: TSH: 4.4 mIU/L (ref 0.40–4.50)

## 2019-12-14 MED ORDER — EMPAGLIFLOZIN 25 MG PO TABS: 25.0000 mg | ORAL_TABLET | Freq: Every day | ORAL | 1 refills | Status: DC

## 2019-12-15 ENCOUNTER — Other Ambulatory Visit: Payer: Self-pay | Admitting: Internal Medicine

## 2019-12-15 DIAGNOSIS — E118 Type 2 diabetes mellitus with unspecified complications: Secondary | ICD-10-CM

## 2019-12-15 DIAGNOSIS — E785 Hyperlipidemia, unspecified: Secondary | ICD-10-CM

## 2019-12-15 DIAGNOSIS — Z794 Long term (current) use of insulin: Secondary | ICD-10-CM

## 2019-12-15 DIAGNOSIS — I10 Essential (primary) hypertension: Secondary | ICD-10-CM

## 2019-12-16 MED ORDER — LANTUS SOLOSTAR 100 UNIT/ML ~~LOC~~ SOPN: 50.0000 [IU] | PEN_INJECTOR | Freq: Every day | SUBCUTANEOUS | 1 refills | Status: DC

## 2019-12-27 ENCOUNTER — Encounter: Payer: Self-pay | Admitting: Internal Medicine

## 2019-12-28 ENCOUNTER — Other Ambulatory Visit: Payer: Self-pay | Admitting: Internal Medicine

## 2019-12-28 DIAGNOSIS — B3742 Candidal balanitis: Secondary | ICD-10-CM

## 2019-12-28 MED ORDER — ITRACONAZOLE 200 MG PO TABS: 1.0000 | ORAL_TABLET | Freq: Two times a day (BID) | ORAL | 1 refills | Status: DC

## 2020-01-10 DIAGNOSIS — G5602 Carpal tunnel syndrome, left upper limb: Secondary | ICD-10-CM | POA: Diagnosis not present

## 2020-01-16 ENCOUNTER — Ambulatory Visit: Payer: Medicare Other | Admitting: Pharmacist

## 2020-01-16 ENCOUNTER — Other Ambulatory Visit: Payer: Self-pay

## 2020-01-16 DIAGNOSIS — I10 Essential (primary) hypertension: Secondary | ICD-10-CM

## 2020-01-16 DIAGNOSIS — E785 Hyperlipidemia, unspecified: Secondary | ICD-10-CM

## 2020-01-16 DIAGNOSIS — E118 Type 2 diabetes mellitus with unspecified complications: Secondary | ICD-10-CM

## 2020-01-16 NOTE — Chronic Care Management (AMB) (Signed)
Chronic Care Management Pharmacy  Name: Clifford Brown  MRN: [id number] DOB: 01-28-1951   Chief Complaint/ HPI  Clifford Brown,  69 y.o. , male presents for their Follow-Up CCM visit with the clinical pharmacist via telephone due to COVID-19 Pandemic.  PCP : [name], MD  Their chronic conditions include: HTN, T2DM, HLD, hypothyroidism, allergies, depression,   Office Visits: 12/27/19 pt message: yeast infection, rx'd itraconazole.  12/12/19 Dr [name] OV: A1c up to 8.8%, increase Lantus to 50 units, continue Jardiance and Byrdureon  05/30/19 Dr [name] OV: A1c up to 7.9, emphasized compliance and lifestyle. Mildly elevated TSH but clinically euthyroid.   Consult Visit:  08/27/19 Dr Grandville Silos (orthopedic surgery): OV for back pain, trigger finger.  No Known Allergies  Medications: Outpatient Encounter Medications as of 01/16/2020  Medication Sig  . aspirin EC 81 MG tablet Take 1 tablet (81 mg total) by mouth daily.  Marland Kitchen BYDUREON BCISE 2 MG/0.85ML AUIJ INJECT 1 PEN AS DIRECTED ONCE A WEEK  . empagliflozin (JARDIANCE) 25 MG TABS tablet Take 1 tablet (25 mg total) by mouth daily.  Marland Kitchen FREESTYLE LITE test strip USE AS DIRECTED 3 TIMES A DAY  . insulin glargine (LANTUS SOLOSTAR) 100 UNIT/ML Solostar Pen Inject 50 Units into the skin daily. (Patient taking differently: Inject 30 Units into the skin daily. )  . Insulin Pen Needle (B-D ULTRAFINE III SHORT PEN) 31G X 8 MM MISC USE DAILY WITH INSULIN  . Itraconazole 200 MG TABS Take 1 tablet by mouth 2 (two) times daily.  . metFORMIN (GLUCOPHAGE-XR) 750 MG 24 hr tablet TAKE 2 TABLETS BY MOUTH DAILY WITH BREAKFAST.  . rosuvastatin (CRESTOR) 10 MG tablet TAKE 1 TABLET BY MOUTH EVERY DAY  . telmisartan (MICARDIS) 40 MG tablet TAKE 1 TABLET BY MOUTH EVERY DAY  . [DISCONTINUED] Exenatide ER (BYDUREON BCISE) 2 MG/0.85ML AUIJ Inject 1 Act into the skin once a week.  . [DISCONTINUED] Insulin Glargine (LANTUS SOLOSTAR) 100 UNIT/ML Solostar Pen Inject  30 Units into the skin daily.  . [DISCONTINUED] JARDIANCE 25 MG TABS tablet TAKE 1 TABLET BY MOUTH EVERY DAY  . [DISCONTINUED] metFORMIN (GLUCOPHAGE-XR) 750 MG 24 hr tablet TAKE 2 TABLETS BY MOUTH DAILY WITH BREAKFAST.  . [DISCONTINUED] rosuvastatin (CRESTOR) 10 MG tablet TAKE 1 TABLET BY MOUTH EVERY DAY  . [DISCONTINUED] telmisartan (MICARDIS) 40 MG tablet TAKE 1 TABLET BY MOUTH EVERY DAY   No facility-administered encounter medications on file as of 01/16/2020.   Wt Readings from Last 3 Encounters:  12/12/19 176 lb (79.8 kg)  11/27/19 180 lb 6.4 oz (81.8 kg)  05/30/19 178 lb 8 oz (81 kg)    Current Diagnosis/Assessment:   Goals Addressed            This Visit's Progress   . Pharmacy Care Plan       CARE PLAN ENTRY  Current Barriers:  . Chronic Disease Management support, education, and care coordination needs related to Hypertension, Hyperlipidemia, and Diabetes   Hypertension BP Readings from Last 3 Encounters:  05/30/19 136/82  12/05/18 120/80  11/21/18 138/85 .  Pharmacist Clinical Goal(s): o Over the next 180 days, patient will work with PharmD and providers to maintain BP goal < 130/80 . Current regimen:  o Telmisartan 40 mg daily . Interventions: o Discussed BP goals and benefits of telmisartan for BP control and kidney protection . Patient self care activities - Over the next 180 days, patient will: o Ensure daily salt intake < 2300 mg/day  Hyperlipidemia Lab Results  Component Value Date/Time   LDLCALC 77 05/30/2019 10:18 AM   LDLDIRECT 183.6 01/31/2011 01:58 PM .  Pharmacist Clinical Goal(s): o Over the next 180 days, patient will work with PharmD and providers to maintain LDL goal < 100 . Current regimen:  o Rosuvastatin 10 mg daily . Interventions: o Discussed benefits of statin for cholesterol control and heart disease prevention . Patient self care activities - Over the next 180 days, patient will: o Take medication as prescribed o Adhere to low  cholesterol diet  Diabetes Lab Results  Component Value Date/Time   HGBA1C 7.9 (H) 05/30/2019 10:18 AM   HGBA1C 7.1 (H) 12/05/2018 09:37 AM .  Pharmacist Clinical Goal(s): o Over the next 180 days, patient will work with PharmD and providers to achieve A1c goal < 7% . Current regimen:  o Metformin ER 750 mg twice a day o Jardiance 25 mg daily o Bydureon BCise 2 mg once weekly o Lantus 30 units daily . Interventions: o Discussed importance of maintaining sugars at goal to prevent complications of diabetes including kidney damage, retinal damage, and cardiovascular disease . Patient self care activities - Over the next 180 days, patient will: o Check blood sugar 2-3 times weekly, document, and provide at future appointments o Contact provider with any episodes of hypoglycemia o Take medications as prescribed  Medication management . Pharmacist Clinical Goal(s): o Over the next 180 days, patient will work with PharmD and providers to achieve optimal medication adherence . Current [facility] . Interventions o Comprehensive medication review performed. o Continue current medication management strategy . Patient self care activities - Over the next 180 days, patient will: o Focus on medication adherence by pill box o Take medications as prescribed o Report any questions or concerns to PharmD and/or provider(s)  Please see past updates related to this goal by clicking on the "Past Updates" button in the selected goal         Hypertension   BP goal < 130/80  Office blood pressures are  BP Readings from Last 3 Encounters:  12/12/19 136/86  11/27/19 120/72  05/30/19 136/82   Lab Results  Component Value Date   CREATININE 1.08 12/12/2019   BUN 21 12/12/2019   GFR 67.14 05/30/2019   GFRNONAA 70 12/12/2019   GFRAA 81 12/12/2019   NA 139 12/12/2019   K 4.5 12/12/2019   CALCIUM 10.1 12/12/2019   CO2 23 12/12/2019   Patient has failed these meds in the past:  n/a Patient is currently controlled on the following medications:   Telmisartan 40 mg daily  Patient checks BP at home infrequently  Patient home BP readings are ranging: n/a  We discussed diet and exercise extensively; BP goals and benefits of telmisartan for renal protection.  Plan  Continue current medications and control with diet and exercise     Diabetes   A1c goal < 7%  Recent Relevant Labs: Lab Results  Component Value Date/Time   HGBA1C 8.8 (H) 12/12/2019 09:50 AM   HGBA1C 7.9 (H) 05/30/2019 10:18 AM   GFR 67.14 05/30/2019 10:18 AM   GFR 74.27 12/05/2018 09:37 AM   MICROALBUR 2.2 (H) 05/30/2019 10:18 AM   MICROALBUR 2.0 (H) 04/10/2018 12:20 PM    Last diabetic Eye exam:  Lab Results  Component Value Date/Time   HMDIABEYEEXA No Retinopathy 06/19/2019 12:00 AM    Last diabetic Foot exam:  Lab Results  Component Value Date/Time   HMDIABFOOTEX done 09/20/2017 12:00 AM    Checking BG: daily  Recent FBG Readings: 100   Patient has failed these meds in past: Shaune Pollack, [place] Patient is currently uncontrolled on the following medications:   Metformin ER 750 mg BID  Jardiance 25 mg daily  Bydureon BCise 2 mg once weekly - Sun  Lantus 30 units daily    We discussed: Pt reports he got a steroid injection in late August, and he thinks this contributed to his elevated A1c at the end of September; discussed steroids can indeed raise blood sugar temporarily; pt never did increase Lantus to 50 units, fasting BG has normalized to ~100 most days on 30 units. No changes indicated.  Plan  Continue current medications and control with diet and exercise   Hyperlipidemia   LDL goal < 100  Lipid Panel     Component Value Date/Time   CHOL 174 05/30/2019 1018   TRIG 136.0 05/30/2019 1018   HDL 69.60 05/30/2019 1018   CHOLHDL 2 05/30/2019 1018   VLDL 27.2 05/30/2019 1018   LDLCALC 77 05/30/2019 1018   LDLDIRECT 183.6 01/31/2011 1358    The  10-year ASCVD risk score Mikey Bussing DC Jr., et al., 2013) is: 30.7%   Values used to calculate the score:     Age: 48 years     Sex: Male     Is Non-Hispanic African American: No     Diabetic: Yes     Tobacco smoker: No     Systolic Blood Pressure: 552 mmHg     Is BP treated: Yes     HDL Cholesterol: 69.6 mg/dL     Total Cholesterol: 174 mg/dL   Patient has failed these meds in past: n/a Patient is currently controlled on the following medications:   Rosuvastatin 10 mg daily  Aspirin 81 mg daily  We discussed:  diet and exercise extensively; cholesterol goals and benefit of statin for ASCVD risk reduction.  Plan  Continue current medications and control with diet and exercise   Medication Management   [facility] Uses pill box? Yes Pt endorses 100% compliance  We discussed: [facility] with [facility], he is able to get most medications free or cheap because he still has Pharmacist, community and is able to get coupons.  Plan  Continue current medication management strategy    Follow up: 6 month phone visit  Charlene Brooke, PharmD, [facility] Clinical Pharmacist [facility] [phone]

## 2020-01-16 NOTE — Patient Instructions (Signed)
Visit Information  Phone number for Pharmacist: [phone]  Goals Addressed            This Visit's Progress   . Pharmacy Care Plan       CARE PLAN ENTRY  Current Barriers:  . Chronic Disease Management support, education, and care coordination needs related to Hypertension, Hyperlipidemia, and Diabetes   Hypertension BP Readings from Last 3 Encounters:  05/30/19 136/82  12/05/18 120/80  11/21/18 138/85 .  Pharmacist Clinical Goal(s): o Over the next 180 days, patient will work with PharmD and providers to maintain BP goal < 130/80 . Current regimen:  o Telmisartan 40 mg daily . Interventions: o Discussed BP goals and benefits of telmisartan for BP control and kidney protection . Patient self care activities - Over the next 180 days, patient will: o Ensure daily salt intake < 2300 mg/day  Hyperlipidemia Lab Results  Component Value Date/Time   LDLCALC 77 05/30/2019 10:18 AM   LDLDIRECT 183.6 01/31/2011 01:58 PM .  Pharmacist Clinical Goal(s): o Over the next 180 days, patient will work with PharmD and providers to maintain LDL goal < 100 . Current regimen:  o Rosuvastatin 10 mg daily . Interventions: o Discussed benefits of statin for cholesterol control and heart disease prevention . Patient self care activities - Over the next 180 days, patient will: o Take medication as prescribed o Adhere to low cholesterol diet  Diabetes Lab Results  Component Value Date/Time   HGBA1C 7.9 (H) 05/30/2019 10:18 AM   HGBA1C 7.1 (H) 12/05/2018 09:37 AM .  Pharmacist Clinical Goal(s): o Over the next 180 days, patient will work with PharmD and providers to achieve A1c goal < 7% . Current regimen:  o Metformin ER 750 mg twice a day o Jardiance 25 mg daily o Bydureon BCise 2 mg once weekly o Lantus 30 units daily . Interventions: o Discussed importance of maintaining sugars at goal to prevent complications of diabetes including kidney damage, retinal damage, and  cardiovascular disease . Patient self care activities - Over the next 180 days, patient will: o Check blood sugar 2-3 times weekly, document, and provide at future appointments o Contact provider with any episodes of hypoglycemia o Take medications as prescribed  Medication management . Pharmacist Clinical Goal(s): o Over the next 180 days, patient will work with PharmD and providers to achieve optimal medication adherence . [facility] . Interventions o Comprehensive medication review performed. o Continue current medication management strategy . Patient self care activities - Over the next 180 days, patient will: o Focus on medication adherence by pill box o Take medications as prescribed o Report any questions or concerns to PharmD and/or provider(s)  Please see past updates related to this goal by clicking on the "Past Updates" button in the selected goal        Patient verbalizes understanding of instructions provided today.  Telephone follow up appointment with pharmacy team member scheduled for: 6 months  Charlene Brooke, PharmD, [facility] Clinical Pharmacist [facility] [phone]

## 2020-01-21 DIAGNOSIS — Z23 Encounter for immunization: Secondary | ICD-10-CM | POA: Diagnosis not present

## 2020-02-03 ENCOUNTER — Other Ambulatory Visit: Payer: Self-pay | Admitting: Internal Medicine

## 2020-02-03 DIAGNOSIS — E118 Type 2 diabetes mellitus with unspecified complications: Secondary | ICD-10-CM

## 2020-02-03 DIAGNOSIS — Z794 Long term (current) use of insulin: Secondary | ICD-10-CM

## 2020-04-07 ENCOUNTER — Other Ambulatory Visit: Payer: Self-pay | Admitting: Internal Medicine

## 2020-04-07 DIAGNOSIS — E118 Type 2 diabetes mellitus with unspecified complications: Secondary | ICD-10-CM

## 2020-04-07 DIAGNOSIS — Z794 Long term (current) use of insulin: Secondary | ICD-10-CM

## 2020-05-01 ENCOUNTER — Other Ambulatory Visit: Payer: Self-pay | Admitting: Internal Medicine

## 2020-05-01 DIAGNOSIS — Z794 Long term (current) use of insulin: Secondary | ICD-10-CM

## 2020-05-01 DIAGNOSIS — E118 Type 2 diabetes mellitus with unspecified complications: Secondary | ICD-10-CM

## 2020-05-12 ENCOUNTER — Encounter: Payer: Self-pay | Admitting: Internal Medicine

## 2020-05-12 ENCOUNTER — Other Ambulatory Visit: Payer: Self-pay

## 2020-05-12 ENCOUNTER — Ambulatory Visit (INDEPENDENT_AMBULATORY_CARE_PROVIDER_SITE_OTHER): Payer: Medicare Other | Admitting: Internal Medicine

## 2020-05-12 VITALS — BP 124/76 | HR 92 | Temp 98.1°F | Ht 70.0 in | Wt 181.0 lb

## 2020-05-12 DIAGNOSIS — E785 Hyperlipidemia, unspecified: Secondary | ICD-10-CM | POA: Diagnosis not present

## 2020-05-12 DIAGNOSIS — I10 Essential (primary) hypertension: Secondary | ICD-10-CM

## 2020-05-12 DIAGNOSIS — E118 Type 2 diabetes mellitus with unspecified complications: Secondary | ICD-10-CM

## 2020-05-12 DIAGNOSIS — Z8546 Personal history of malignant neoplasm of prostate: Secondary | ICD-10-CM | POA: Diagnosis not present

## 2020-05-12 DIAGNOSIS — E039 Hypothyroidism, unspecified: Secondary | ICD-10-CM | POA: Diagnosis not present

## 2020-05-12 DIAGNOSIS — Z23 Encounter for immunization: Secondary | ICD-10-CM

## 2020-05-12 LAB — CBC WITH DIFFERENTIAL/PLATELET
Basophils Absolute: 0.1 10*3/uL (ref 0.0–0.1)
Basophils Relative: 1.1 % (ref 0.0–3.0)
Eosinophils Absolute: 0.2 10*3/uL (ref 0.0–0.7)
Eosinophils Relative: 3.4 % (ref 0.0–5.0)
HCT: 41.7 % (ref 39.0–52.0)
Hemoglobin: 13.9 g/dL (ref 13.0–17.0)
Lymphocytes Relative: 20.8 % (ref 12.0–46.0)
Lymphs Abs: 1.1 10*3/uL (ref 0.7–4.0)
MCHC: 33.2 g/dL (ref 30.0–36.0)
MCV: 86.4 fl (ref 78.0–100.0)
Monocytes Absolute: 0.3 10*3/uL (ref 0.1–1.0)
Monocytes Relative: 5.8 % (ref 3.0–12.0)
Neutro Abs: 3.6 10*3/uL (ref 1.4–7.7)
Neutrophils Relative %: 68.9 % (ref 43.0–77.0)
Platelets: 195 10*3/uL (ref 150.0–400.0)
RBC: 4.83 Mil/uL (ref 4.22–5.81)
RDW: 14 % (ref 11.5–15.5)
WBC: 5.2 10*3/uL (ref 4.0–10.5)

## 2020-05-12 LAB — URINALYSIS, ROUTINE W REFLEX MICROSCOPIC
Bilirubin Urine: NEGATIVE
Hgb urine dipstick: NEGATIVE
Ketones, ur: NEGATIVE
Leukocytes,Ua: NEGATIVE
Nitrite: NEGATIVE
RBC / HPF: NONE SEEN (ref 0–?)
Specific Gravity, Urine: 1.02 (ref 1.000–1.030)
Total Protein, Urine: NEGATIVE
Urine Glucose: >=1000 — AB
Urobilinogen, UA: 0.2 (ref 0.0–1.0)
WBC, UA: NONE SEEN (ref 0–?)
pH: 5.5 (ref 5.0–8.0)

## 2020-05-12 LAB — PSA: PSA: 0 ng/mL — ABNORMAL LOW (ref 0.10–4.00)

## 2020-05-12 LAB — MICROALBUMIN / CREATININE URINE RATIO
Creatinine,U: 50.3 mg/dL
Microalb Creat Ratio: 1.4 mg/g (ref 0.0–30.0)
Microalb, Ur: <0.7 mg/dL (ref 0.0–1.9)

## 2020-05-12 LAB — HEMOGLOBIN A1C: Hgb A1c MFr Bld: 8.6 % — ABNORMAL HIGH (ref 4.6–6.5)

## 2020-05-12 LAB — BASIC METABOLIC PANEL
BUN: 24 mg/dL — ABNORMAL HIGH (ref 6–23)
CO2: 25 mEq/L (ref 19–32)
Calcium: 9.4 mg/dL (ref 8.4–10.5)
Chloride: 104 mEq/L (ref 96–112)
Creatinine, Ser: 1.07 mg/dL (ref 0.40–1.50)
GFR: 70.76 mL/min (ref >60.00)
Glucose, Bld: 204 mg/dL — ABNORMAL HIGH (ref 70–99)
Potassium: 4.4 mEq/L (ref 3.5–5.1)
Sodium: 137 mEq/L (ref 135–145)

## 2020-05-12 LAB — LIPID PANEL
Cholesterol: 141 mg/dL (ref 0–200)
HDL: 67.7 mg/dL (ref >39.00)
LDL Cholesterol: 58 mg/dL (ref 0–99)
NonHDL: 73.11
Total CHOL/HDL Ratio: 2
Triglycerides: 74 mg/dL (ref 0.0–149.0)
VLDL: 14.8 mg/dL (ref 0.0–40.0)

## 2020-05-12 LAB — HEPATIC FUNCTION PANEL
ALT: 20 U/L (ref 0–53)
AST: 16 U/L (ref 0–37)
Albumin: 3.9 g/dL (ref 3.5–5.2)
Alkaline Phosphatase: 70 U/L (ref 39–117)
Bilirubin, Direct: 0.1 mg/dL (ref 0.0–0.3)
Total Bilirubin: 0.5 mg/dL (ref 0.2–1.2)
Total Protein: 6.1 g/dL (ref 6.0–8.3)

## 2020-05-12 LAB — TSH: TSH: 6.27 u[IU]/mL — ABNORMAL HIGH (ref 0.35–4.50)

## 2020-05-12 MED ORDER — BOOSTRIX 5-2.5-18.5 LF-MCG/0.5 IM SUSP: 0.5000 mL | Freq: Once | INTRAMUSCULAR | 0 refills | Status: AC

## 2020-05-12 MED ORDER — DEXCOM G6 TRANSMITTER MISC: 1.0000 | Freq: Every day | 5 refills | Status: DC

## 2020-05-12 MED ORDER — DEXCOM G6 SENSOR MISC
1.0000 | Freq: Every day | 5 refills | Status: DC
Start: 2020-05-12 — End: 2021-01-20

## 2020-05-12 MED ORDER — DEXCOM G6 RECEIVER DEVI
1.0000 | Freq: Every day | 5 refills | Status: DC
Start: 2020-05-12 — End: 2022-01-24

## 2020-05-12 NOTE — Patient Instructions (Signed)
Type 2 Diabetes Mellitus, Diagnosis, Adult Type 2 diabetes (type 2 diabetes mellitus) is a long-term, or chronic, disease. In type 2 diabetes, one or both of these problems may be present:  The pancreas does not make enough of a hormone called insulin.  Cells in the body do not respond properly to insulin that the body makes (insulin resistance). Normally, insulin allows blood sugar (glucose) to enter cells in the body. The cells use glucose for energy. Insulin resistance or lack of insulin causes excess glucose to build up in the blood instead of going into cells. This causes high blood glucose (hyperglycemia).  What are the causes? The exact cause of type 2 diabetes is not known. What increases the risk? The following factors may make you more likely to develop this condition:  Having a family member with type 2 diabetes.  Being overweight or obese.  Being inactive (sedentary).  Having been diagnosed with insulin resistance.  Having a history of prediabetes, diabetes when you were pregnant (gestational diabetes), or polycystic ovary syndrome (PCOS). What are the signs or symptoms? In the early stage of this condition, you may not have symptoms. Symptoms develop slowly and may include:  Increased thirst or hunger.  Increased urination.  Unexplained weight loss.  Tiredness (fatigue) or weakness.  Vision changes, such as blurry vision.  Dark patches on the skin. How is this diagnosed? This condition is diagnosed based on your symptoms, your medical history, a physical exam, and your blood glucose level. Your blood glucose may be checked with one or more of the following blood tests:  A fasting blood glucose (FBG) test. You will not be allowed to eat (you will fast) for 8 hours or longer before a blood sample is taken.  A random blood glucose test. This test checks blood glucose at any time of day regardless of when you ate.  An A1C (hemoglobin A1C) blood test. This test  provides information about blood glucose levels over the previous 2-3 months.  An oral glucose tolerance test (OGTT). This test measures your blood glucose at two times: ? After fasting. This is your baseline blood glucose level. ? Two hours after drinking a beverage that contains glucose. You may be diagnosed with type 2 diabetes if:  Your fasting blood glucose level is 126 mg/dL (7.0 mmol/L) or higher.  Your random blood glucose level is 200 mg/dL (11.1 mmol/L) or higher.  Your A1C level is 6.5% or higher.  Your oral glucose tolerance test result is higher than 200 mg/dL (11.1 mmol/L). These blood tests may be repeated to confirm your diagnosis.   How is this treated? Your treatment may be managed by a specialist called an endocrinologist. Type 2 diabetes may be treated by following instructions from your health care provider about:  Making dietary and lifestyle changes. These may include: ? Following a personalized nutrition plan that is developed by a registered dietitian. ? Exercising regularly. ? Finding ways to manage stress.  Checking your blood glucose level as often as told.  Taking diabetes medicines or insulin daily. This helps to keep your blood glucose levels in the healthy range.  Taking medicines to help prevent complications from diabetes. Medicines may include: ? Aspirin. ? Medicine to lower cholesterol. ? Medicine to control blood pressure. Your health care provider will set treatment goals for you. Your goals will be based on your age, other medical conditions you have, and how you respond to diabetes treatment. Generally, the goal of treatment is to maintain the   following blood glucose levels:  Before meals: 80-130 mg/dL (4.4-7.2 mmol/L).  After meals: below 180 mg/dL (10 mmol/L).  A1C level: less than 7%. Follow these instructions at home: Questions to ask your health care provider Consider asking the following questions:  Should I meet with a certified  diabetes care and education specialist?  What diabetes medicines do I need, and when should I take them?  What equipment will I need to manage my diabetes at home?  How often do I need to check my blood glucose?  Where can I find a support group for people with diabetes?  What number can I call if I have questions?  When is my next appointment? General instructions  Take over-the-counter and prescription medicines only as told by your health care provider.  Keep all follow-up visits as told by your health care provider. This is important. Where to find more information  American Diabetes Association (ADA): www.diabetes.org  American Association of Diabetes Care and Education Specialists (ADCES): www.diabeteseducator.org  International Diabetes Federation (IDF): www.idf.org Contact a health care provider if:  Your blood glucose is at or above 240 mg/dL (13.3 mmol/L) for 2 days in a row.  You have been sick or have had a fever for 2 days or longer, and you are not getting better.  You have any of the following problems for more than 6 hours: ? You cannot eat or drink. ? You have nausea and vomiting. ? You have diarrhea. Get help right away if:  You have severe hypoglycemia. This means your blood glucose is lower than 54 mg/dL (3.0 mmol/L).  You become confused or you have trouble thinking clearly.  You have difficulty breathing.  You have moderate or large ketone levels in your urine. These symptoms may represent a serious problem that is an emergency. Do not wait to see if the symptoms will go away. Get medical help right away. Call your local emergency services (911 in the [place] Do not drive yourself to the hospital. Summary  Type 2 diabetes (type 2 diabetes mellitus) is a long-term, or chronic, disease. In type 2 diabetes, the pancreas does not make enough of a hormone called insulin, or cells in the body do not respond properly to insulin that the body makes (insulin  resistance).  This condition is treated by making dietary and lifestyle changes and taking diabetes medicines or insulin.  Your health care provider will set treatment goals for you. Your goals will be based on your age, other medical conditions you have, and how you respond to diabetes treatment.  Keep all follow-up visits as told by your health care provider. This is important. This information is not intended to replace advice given to you by your health care provider. Make sure you discuss any questions you have with your health care provider. Document Revised: 09/25/2019 Document Reviewed: 09/25/2019 Elsevier Patient Education  2021 [employer]  

## 2020-05-12 NOTE — Progress Notes (Signed)
Subjective:  Patient ID: Clifford Brown, male    DOB: 1950-06-27  Age: 70 y.o. MRN: [id number]  CC: Hypertension, Hyperlipidemia, Hypothyroidism, and Diabetes  This visit occurred during the SARS-CoV-2 public health emergency.  Safety protocols were in place, including screening questions prior to the visit, additional usage of staff PPE, and extensive cleaning of exam room while observing appropriate contact time as indicated for disinfecting solutions.    HPI [name] presents for f/up -   He walks about 2 miles a day.  He does not experience CP, DOE, palpitations, edema, or fatigue.  He complains of weight gain.  He has not been monitoring his blood sugar.  He tells me he is compliant with all of his glycemic agents.  Outpatient Medications Prior to Visit  Medication Sig Dispense Refill  . aspirin EC 81 MG tablet Take 1 tablet (81 mg total) by mouth daily. 90 tablet 3  . B-D ULTRAFINE III SHORT PEN 31G X 8 MM MISC USE DAILY WITH INSULIN 100 each 1  . BYDUREON BCISE 2 MG/0.85ML AUIJ INJECT 1 PEN AS DIRECTED ONCE A WEEK 3 mL 0  . empagliflozin (JARDIANCE) 25 MG TABS tablet Take 1 tablet (25 mg total) by mouth daily. 90 tablet 1  . FREESTYLE LITE test strip USE AS DIRECTED 3 TIMES A DAY 300 each 3  . insulin glargine (LANTUS SOLOSTAR) 100 UNIT/ML Solostar Pen Inject 50 Units into the skin daily. (Patient taking differently: Inject 30 Units into the skin daily.) 45 mL 1  . metFORMIN (GLUCOPHAGE-XR) 750 MG 24 hr tablet TAKE 2 TABLETS BY MOUTH DAILY WITH BREAKFAST. 180 tablet 1  . rosuvastatin (CRESTOR) 10 MG tablet TAKE 1 TABLET BY MOUTH EVERY DAY 90 tablet 1  . telmisartan (MICARDIS) 40 MG tablet TAKE 1 TABLET BY MOUTH EVERY DAY 90 tablet 1  . Itraconazole 200 MG TABS Take 1 tablet by mouth 2 (two) times daily. (Patient not taking: Reported on 05/12/2020) 2 tablet 1   No facility-administered medications prior to visit.    ROS Review of Systems  Constitutional: Positive for  unexpected weight change (wt gain). Negative for appetite change, chills, diaphoresis and fatigue.  HENT: Negative.   Eyes: Negative.   Respiratory: Negative for cough, chest tightness, shortness of breath and wheezing.   Cardiovascular: Negative for chest pain, palpitations and leg swelling.  Gastrointestinal: Negative for abdominal pain, constipation, diarrhea, nausea and vomiting.  Endocrine: Negative.  Negative for polydipsia, polyphagia and polyuria.  Genitourinary: Negative.  Negative for difficulty urinating.  Musculoskeletal: Positive for arthralgias. Negative for joint swelling and myalgias.  Skin: Negative.  Negative for color change and pallor.  Neurological: Negative.  Negative for dizziness, weakness and light-headedness.  Hematological: Negative for adenopathy. Does not bruise/bleed easily.  Psychiatric/Behavioral: Negative.     Objective:  BP 124/76   Pulse 92   Temp 98.1 F (36.7 C) (Oral)   Ht 5\' 10"  (1.778 m)   Wt 181 lb (82.1 kg)   SpO2 97%   BMI 25.97 kg/m   BP Readings from Last 3 Encounters:  05/12/20 124/76  12/12/19 136/86  11/27/19 120/72    Wt Readings from Last 3 Encounters:  05/12/20 181 lb (82.1 kg)  12/12/19 176 lb (79.8 kg)  11/27/19 180 lb 6.4 oz (81.8 kg)    Physical Exam Vitals reviewed.  HENT:     Nose: Nose normal.     Mouth/Throat:     Mouth: Mucous membranes are moist.  Eyes:  General: No scleral icterus.    Conjunctiva/sclera: Conjunctivae normal.  Cardiovascular:     Rate and Rhythm: Normal rate and regular rhythm.     Heart sounds: No murmur heard.   Pulmonary:     Effort: Pulmonary effort is normal.     Breath sounds: No stridor. No wheezing, rhonchi or rales.  Abdominal:     General: Abdomen is protuberant. Bowel sounds are normal. There is no distension.     Palpations: Abdomen is soft. There is no hepatomegaly, splenomegaly or mass.     Tenderness: There is no abdominal tenderness.  Musculoskeletal:         General: Normal range of motion.     Cervical back: Neck supple.     Right lower leg: No edema.     Left lower leg: No edema.  Lymphadenopathy:     Cervical: No cervical adenopathy.  Skin:    General: Skin is warm and dry.  Neurological:     General: No focal deficit present.     Mental Status: He is alert.  Psychiatric:        Mood and Affect: Mood normal.        Behavior: Behavior normal.     Lab Results  Component Value Date   WBC 5.2 05/12/2020   HGB 13.9 05/12/2020   HCT 41.7 05/12/2020   PLT 195.0 05/12/2020   GLUCOSE 204 (H) 05/12/2020   CHOL 141 05/12/2020   TRIG 74.0 05/12/2020   HDL 67.70 05/12/2020   LDLDIRECT 183.6 01/31/2011   LDLCALC 58 05/12/2020   ALT 20 05/12/2020   AST 16 05/12/2020   NA 137 05/12/2020   K 4.4 05/12/2020   CL 104 05/12/2020   CREATININE 1.07 05/12/2020   BUN 24 (H) 05/12/2020   CO2 25 05/12/2020   TSH 6.27 (H) 05/12/2020   PSA 0.00 (L) 05/12/2020   HGBA1C 8.6 (H) 05/12/2020   MICROALBUR <0.7 05/12/2020    DG Chest 2 View  Result Date: 04/10/2018 CLINICAL DATA:  Cough EXAM: CHEST - 2 VIEW COMPARISON:  12/03/2008 FINDINGS: Heart and mediastinal contours are within normal limits. No focal opacities or effusions. No acute bony abnormality. IMPRESSION: No active cardiopulmonary disease. Electronically Signed   By: Rolm Baptise M.D.   On: 04/10/2018 17:15    Assessment & Plan:   Romir was seen today for hypertension, hyperlipidemia, hypothyroidism and diabetes.  Diagnoses and all orders for this visit:  Essential hypertension, benign- His blood pressure is adequately well controlled. -     CBC with Differential/Platelet; Future -     Basic metabolic panel; Future -     Urinalysis, Routine w reflex microscopic; Future -     Urinalysis, Routine w reflex microscopic -     Basic metabolic panel -     CBC with Differential/Platelet  Acquired hypothyroidism- His TSH is stable at just above 6.  His only complaint is weight gain.   This is consistent with subclinical hypothyroidism.  I do not recommend thyroid replacement therapy at this time. -     TSH; Future -     TSH  Type II diabetes mellitus with manifestations ([place])- His blood sugar is not adequately well controlled.  Will continue the current glycemic agent and I have asked him to improve his lifestyle modifications. -     Basic metabolic panel; Future -     Microalbumin / creatinine urine ratio; Future -     Hemoglobin A1c; Future -  Urinalysis, Routine w reflex microscopic; Future -     Continuous Blood Gluc Receiver ([place]) DEVI; 1 Act by Does not apply route daily. -     Continuous Blood Gluc Transmit (DEXCOM G6 TRANSMITTER) MISC; 1 Act by Does not apply route daily. -     Continuous Blood Gluc Sensor (DEXCOM G6 SENSOR) MISC; 1 Act by Does not apply route daily. -     Urinalysis, Routine w reflex microscopic -     Hemoglobin A1c -     Microalbumin / creatinine urine ratio -     Basic metabolic panel  Dyslipidemia (high LDL; low HDL)- He has achieved his LDL goal and is doing well on the statin. -     Lipid panel; Future -     Hepatic function panel; Future -     Hepatic function panel -     Lipid panel  PROSTATE CANCER, HX OF- His PSA remains undetectable. -     PSA; Future -     PSA  Need for prophylactic vaccination with combined diphtheria-tetanus-pertussis (DTP) vaccine -     Tdap (BOOSTRIX) 5-2.5-18.5 LF-MCG/0.5 injection; Inject 0.5 mLs into the muscle once for 1 dose.   I have discontinued Maher Seyler "Gene"'s Itraconazole. I am also having him start on Dexcom G6 Receiver, Dexcom G6 Transmitter, Dexcom G6 Sensor, and Boostrix. Additionally, I am having him maintain his aspirin EC, FREESTYLE LITE, empagliflozin, metFORMIN, telmisartan, rosuvastatin, Lantus SoloStar, B-D ULTRAFINE III SHORT PEN, and Bydureon BCise.  Meds ordered this encounter  Medications  . Continuous Blood Gluc Receiver (DEXCOM G6 RECEIVER) DEVI    Sig:  1 Act by Does not apply route daily.    Dispense:  1 each    Refill:  5  . Continuous Blood Gluc Transmit (DEXCOM G6 TRANSMITTER) MISC    Sig: 1 Act by Does not apply route daily.    Dispense:  1 each    Refill:  5  . Continuous Blood Gluc Sensor (DEXCOM G6 SENSOR) MISC    Sig: 1 Act by Does not apply route daily.    Dispense:  1 each    Refill:  5  . Tdap (BOOSTRIX) 5-2.5-18.5 LF-MCG/0.5 injection    Sig: Inject 0.5 mLs into the muscle once for 1 dose.    Dispense:  0.5 mL    Refill:  0     Follow-up: Return in about 4 months (around 09/11/2020).  [name], MD

## 2020-05-22 ENCOUNTER — Other Ambulatory Visit: Payer: Self-pay | Admitting: Internal Medicine

## 2020-05-22 DIAGNOSIS — E118 Type 2 diabetes mellitus with unspecified complications: Secondary | ICD-10-CM

## 2020-05-22 DIAGNOSIS — Z794 Long term (current) use of insulin: Secondary | ICD-10-CM

## 2020-05-22 DIAGNOSIS — E785 Hyperlipidemia, unspecified: Secondary | ICD-10-CM

## 2020-05-22 DIAGNOSIS — I10 Essential (primary) hypertension: Secondary | ICD-10-CM

## 2020-05-25 ENCOUNTER — Other Ambulatory Visit: Payer: Self-pay | Admitting: Internal Medicine

## 2020-05-25 DIAGNOSIS — E118 Type 2 diabetes mellitus with unspecified complications: Secondary | ICD-10-CM

## 2020-05-25 DIAGNOSIS — Z794 Long term (current) use of insulin: Secondary | ICD-10-CM

## 2020-05-26 ENCOUNTER — Telehealth: Payer: Self-pay | Admitting: Internal Medicine

## 2020-05-26 ENCOUNTER — Other Ambulatory Visit: Payer: Self-pay | Admitting: Internal Medicine

## 2020-05-26 ENCOUNTER — Encounter: Payer: Self-pay | Admitting: Internal Medicine

## 2020-05-26 DIAGNOSIS — Z794 Long term (current) use of insulin: Secondary | ICD-10-CM

## 2020-05-26 DIAGNOSIS — E118 Type 2 diabetes mellitus with unspecified complications: Secondary | ICD-10-CM

## 2020-05-26 MED ORDER — BYDUREON BCISE 2 MG/0.85ML ~~LOC~~ AUIJ: 1.0000 | AUTO-INJECTOR | SUBCUTANEOUS | 1 refills | Status: DC

## 2020-05-26 NOTE — Telephone Encounter (Signed)
Clifford Brown called and stated the form was missing the diagnoses code for the Dexcom G6. She has already faxed over a new form and requesting it be completed and faxed back. No call back required.

## 2020-05-26 NOTE — Telephone Encounter (Signed)
Dx code has been added and faxed back.

## 2020-07-07 LAB — HM DIABETES EYE EXAM

## 2020-07-14 ENCOUNTER — Other Ambulatory Visit: Payer: Self-pay

## 2020-07-14 ENCOUNTER — Ambulatory Visit (INDEPENDENT_AMBULATORY_CARE_PROVIDER_SITE_OTHER): Payer: Medicare Other | Admitting: Pharmacist

## 2020-07-14 DIAGNOSIS — E785 Hyperlipidemia, unspecified: Secondary | ICD-10-CM

## 2020-07-14 DIAGNOSIS — E118 Type 2 diabetes mellitus with unspecified complications: Secondary | ICD-10-CM

## 2020-07-14 DIAGNOSIS — I10 Essential (primary) hypertension: Secondary | ICD-10-CM | POA: Diagnosis not present

## 2020-07-14 MED ORDER — LANTUS SOLOSTAR 100 UNIT/ML ~~LOC~~ SOPN: 25.0000 [IU] | PEN_INJECTOR | Freq: Every day | SUBCUTANEOUS | 1 refills | Status: DC

## 2020-07-14 NOTE — Patient Instructions (Signed)
Visit Information  Phone number for Pharmacist: [phone]  Decrease LANTUS to 25 units daily and continue monitoring sugars with Dexcom.  Patient verbalizes understanding of instructions provided today and agrees to view in [place].  Telephone follow up appointment with pharmacy team member scheduled for: 6 months  Charlene Brooke, PharmD, [place], CPP Clinical Pharmacist [facility] [phone]

## 2020-07-14 NOTE — Progress Notes (Signed)
Chronic Care Management Pharmacy Note  07/14/2020 Name:  Clifford Brown MRN:  [id number] DOB:  Apr 01, 1950  Subjective: Clifford Brown is an 70 y.o. year old male who is a primary patient of [name], MD.  The CCM team was consulted for assistance with disease management and care coordination needs.    Engaged with patient by telephone for follow up visit in response to provider referral for pharmacy case management and/or care coordination services.   Consent to Services:  The patient was given information about Chronic Care Management services, agreed to services, and gave verbal consent prior to initiation of services.  Please see initial visit note for detailed documentation.   Patient Care Team: [name], MD as PCP - General Charlton Haws, [place] as Pharmacist (Pharmacist)  Recent office visits: 05/12/20 Dr [name] OV: chronic f/u; TSH stable in subclinical range, TRT not recommended; advised to work on lifestyle for DM; ordered Dexcom G6   Recent consult visits: [facility] visits: None in previous 6 months  Objective:  Lab Results  Component Value Date   CREATININE 1.07 05/12/2020   BUN 24 (H) 05/12/2020   GFR 70.76 05/12/2020   GFRNONAA 70 12/12/2019   GFRAA 81 12/12/2019   NA 137 05/12/2020   K 4.4 05/12/2020   CALCIUM 9.4 05/12/2020   CO2 25 05/12/2020   GLUCOSE 204 (H) 05/12/2020    Lab Results  Component Value Date/Time   HGBA1C 8.6 (H) 05/12/2020 08:43 AM   HGBA1C 8.8 (H) 12/12/2019 09:50 AM   GFR 70.76 05/12/2020 08:43 AM   GFR 67.14 05/30/2019 10:18 AM   MICROALBUR <0.7 05/12/2020 08:43 AM   MICROALBUR 2.2 (H) 05/30/2019 10:18 AM    Last diabetic Eye exam:  Lab Results  Component Value Date/Time   HMDIABEYEEXA No Retinopathy 07/07/2020 12:00 AM    Last diabetic Foot exam:  Lab Results  Component Value Date/Time   HMDIABFOOTEX done 09/20/2017 12:00 AM     Lab Results  Component Value Date   CHOL 141 05/12/2020   HDL 67.70  05/12/2020   LDLCALC 58 05/12/2020   LDLDIRECT 183.6 01/31/2011   TRIG 74.0 05/12/2020   CHOLHDL 2 05/12/2020    Hepatic Function Latest Ref Rng & Units 05/12/2020 05/30/2019 04/10/2018  Total Protein 6.0 - 8.3 g/dL 6.1 7.0 6.7  Albumin 3.5 - 5.2 g/dL 3.9 4.0 4.3  AST 0 - 37 U/L _0 ALT 0 - 53 U/L _1 Alk Phosphatase 39 - 117 U/L 70 84 78  Total Bilirubin 0.2 - 1.2 mg/dL 0.5 0.6 0.7  Bilirubin, Direct 0.0 - 0.3 mg/dL 0.1 0.1 -    Lab Results  Component Value Date/Time   TSH 6.27 (H) 05/12/2020 08:43 AM   TSH 4.40 12/12/2019 09:50 AM   FREET4 0.77 07/28/2015 09:54 AM   FREET4 0.69 03/30/2015 09:58 AM    CBC Latest Ref Rng & Units 05/12/2020 05/30/2019 04/10/2018  WBC 4.0 - 10.5 K/uL 5.2 5.5 5.4  Hemoglobin 13.0 - 17.0 g/dL 13.9 15.6 15.8  Hematocrit 39.0 - 52.0 % 41.7 46.6 47.1  Platelets 150.0 - 400.0 K/uL 195.0 211.0 192.0    No results found for: VD25OH  Clinical ASCVD: No  The 10-year ASCVD risk score Mikey Bussing DC Jr., et al., 2013) is: 24.3%   Values used to calculate the score:     Age: 74 years     Sex: Male     Is Non-Hispanic African American: No  Diabetic: Yes     Tobacco smoker: No     Systolic Blood Pressure: 595 mmHg     Is BP treated: Yes     HDL Cholesterol: 67.7 mg/dL     Total Cholesterol: 141 mg/dL    Depression screen [facility] 2/9 11/27/2019 11/21/2018 04/10/2018  Decreased Interest 0 0 0  Down, Depressed, Hopeless 0 1 0  PHQ - 2 Score 0 1 0  Altered sleeping - 2 0  Tired, decreased energy - 0 0  Change in appetite - 0 0  Feeling bad or failure about yourself  - 0 0  Trouble concentrating - 0 0  Moving slowly or fidgety/restless - 0 0  Suicidal thoughts - 0 0  PHQ-9 Score - 3 0  Difficult doing work/chores - Not difficult at all Not difficult at all     Social History   Tobacco Use  Smoking Status Never Smoker  Smokeless Tobacco Never Used   BP Readings from Last 3 Encounters:  05/12/20 124/76  12/12/19 136/86  11/27/19 120/72    Pulse Readings from Last 3 Encounters:  05/12/20 92  12/12/19 84  11/27/19 87   Wt Readings from Last 3 Encounters:  05/12/20 181 lb (82.1 kg)  12/12/19 176 lb (79.8 kg)  11/27/19 180 lb 6.4 oz (81.8 kg)   BMI Readings from Last 3 Encounters:  05/12/20 25.97 kg/m  12/12/19 25.25 kg/m  11/27/19 25.88 kg/m    Assessment/Interventions: Review of patient past medical history, allergies, medications, health status, including review of consultants reports, laboratory and other test data, was performed as part of comprehensive evaluation and provision of chronic care management services.   SDOH:  (Social Determinants of Health) assessments and interventions performed: Yes  SDOH Screenings   Alcohol Screen: Not on file  Depression (PHQ2-9): Low Risk   . PHQ-2 Score: 0  Financial Resource Strain: Low Risk   . Difficulty of Paying Living Expenses: Not hard at all  Food Insecurity: No Food Insecurity  . Worried About Charity fundraiser in the Last Year: Never true  . Ran Out of Food in the Last Year: Never true  Housing: Low Risk   . Last Housing Risk Score: 0  Physical Activity: Sufficiently Active  . Days of Exercise per Week: 5 days  . Minutes of Exercise per Session: 30 min  Social Connections: Socially Integrated  . Frequency of Communication with Friends and Family: More than three times a week  . Frequency of Social Gatherings with Friends and Family: More than three times a week  . Attends Religious Services: More than 4 times per year  . Active Member of Clubs or Organizations: Yes  . Attends Archivist Meetings: More than 4 times per year  . Marital Status: Married  Stress: No Stress Concern Present  . Feeling of Stress : Not at all  Tobacco Use: Low Risk   . Smoking Tobacco Use: Never Smoker  . Smokeless Tobacco Use: Never Used  Transportation Needs: No Transportation Needs  . Lack of Transportation (Medical): No  . Lack of Transportation  (Non-Medical): No    CCM Care Plan  No Known Allergies  Medications Reviewed Today    Reviewed by [name], MD (Physician) on 05/12/20 at 0835  Med List Status: <None>  Medication Order Taking? Sig Documenting Provider Last Dose Status Informant  aspirin EC 81 MG tablet [id number] Yes Take 1 tablet (81 mg total) by mouth daily. [name], MD Taking Active  B-D ULTRAFINE III SHORT PEN 31G X 8 MM MISC [id number] Yes USE DAILY WITH INSULIN [name], MD Taking Active   BYDUREON BCISE 2 MG/0.85ML [place] [id number] Yes INJECT 1 PEN AS DIRECTED ONCE A WEEK [name], MD Taking Active   Continuous Blood Gluc Receiver ([place]) DEVI [id number] Yes 1 Act by Does not apply route daily. [name], MD  Active   Continuous Blood Gluc Sensor (DEXCOM G6 SENSOR) MISC [id number] Yes 1 Act by Does not apply route daily. [name], MD  Active   Continuous Blood Gluc Transmit (DEXCOM G6 TRANSMITTER) MISC [id number] Yes 1 Act by Does not apply route daily. [name], MD  Active   empagliflozin (JARDIANCE) 25 MG TABS tablet [id number] Yes Take 1 tablet (25 mg total) by mouth daily. [name], MD Taking Active   FREESTYLE LITE test strip [id number] Yes USE AS DIRECTED 3 TIMES A DAY [name], MD Taking Active   insulin glargine (LANTUS SOLOSTAR) 100 UNIT/ML Solostar Pen [id number] Yes Inject 50 Units into the skin daily.  Patient taking differently: Inject 30 Units into the skin daily.   [name], MD Taking Active   metFORMIN (GLUCOPHAGE-XR) 750 MG 24 hr tablet [id number] Yes TAKE 2 TABLETS BY MOUTH DAILY WITH BREAKFAST. [name], MD Taking Active   rosuvastatin (CRESTOR) 10 MG tablet [id number] Yes TAKE 1 TABLET BY MOUTH EVERY DAY [name], MD Taking Active   telmisartan (MICARDIS) 40 MG tablet [id number] Yes TAKE 1 TABLET BY MOUTH EVERY DAY [name], MD Taking Active           Patient Active Problem List   Diagnosis Date Noted   . Carpal tunnel syndrome on both sides 04/29/2018  . Allergic rhinitis due to pollen 07/28/2015  . Hypothyroidism 03/30/2015  . Insomnia 06/27/2013  . Back pain, lumbosacral 04/01/2012  . Dyslipidemia (high LDL; low HDL) 02/03/2012  . Type II diabetes mellitus with manifestations ([place]) 02/02/2012  . Routine general medical examination at a health care facility 01/31/2011  . DEPRESSION 01/12/2010  . Essential hypertension, benign 01/12/2010  . PROSTATE CANCER, HX OF 01/12/2010    Immunization History  Administered Date(s) Administered  . Fluad Quad(high Dose 65+) 11/21/2018  . Influenza Whole 12/28/2011, 01/03/2013  . Influenza, High Dose Seasonal PF 02/08/2017, 12/04/2017  . Influenza,inj,Quad PF,6+ Mos 11/25/2014  . Influenza-Unspecified 11/13/2019  . PFIZER(Purple Top)SARS-COV-2 Vaccination 05/10/2019, 06/04/2019, 01/21/2020  . Pneumococcal Conjugate-13 08/21/2014  . Pneumococcal Polysaccharide-23 01/08/2013, 05/16/2018  . Td 01/12/2010  . Tdap 05/13/2020  . Zoster 08/21/2014    Conditions to be addressed/monitored:  Hypertension, Hyperlipidemia and Diabetes  Care Plan : [place]  Updates made by Charlton Haws, [place] since 07/14/2020 12:00 AM    Problem: Hypertension, Hyperlipidemia and Diabetes   Priority: High    Long-Range Goal: Disease management   Start Date: 07/14/2020  Expected End Date: 07/14/2021  This Visit's Progress: On track  Priority: High  Note:   Current Barriers:  . Suboptimal therapeutic regimen for diabetes  Pharmacist Clinical Goal(s):  Marland Kitchen Patient will adhere to plan to optimize therapeutic regimen for diabetes as evidenced by report of adherence to recommended medication management changes through collaboration with PharmD and provider.   Interventions: . 1:1 collaboration with [name], MD regarding development and update of comprehensive plan of care as evidenced by provider attestation and  co-signature . Inter-disciplinary care team collaboration (see longitudinal plan of  care) . Comprehensive medication review performed; medication list updated in electronic medical record  Hypertension    BP goal < 130/80 Patient checks BP at home infrequently Patient home BP readings are ranging: n/a   Patient has failed these meds in the past: n/a Patient is currently controlled on the following medications:   Telmisartan 40 mg daily   We discussed diet and exercise extensively; BP goals and benefits of telmisartan for renal protection.   Plan: Continue current medications and control with diet and exercise   Diabetes    A1c goal < 7% Checking BG: daily Recent FBG Readings: 100s; pt reports lows in the 50s early in the morning via Dexcom, occurs every other day   Patient has failed these meds in past: [place], [place]va [place], [place], [place] Patient is currently uncontrolled on the following medications:   Metformin ER 750 mg BID  Jardiance 25 mg daily  Bydureon BCise 2 mg once weekly - Sun  Lantus 30 units daily PM  Dexcom G6    We discussed: Pt reports BG has overall vastly improved since he started monitoring with Dexcom and switching Lantus to afternoon; he reports low alarms go off often, he has not had a high alarm go off; he reports he is currently taking 30 units of Lantus in the afternoons; -Pt does not endorse symptoms of hypoglycemia accompanying readings in the 50s; CGMs are also known to be less accurate with lows, so it is unclear if these are true hypoglycemic events or if pt has hypoglycemia unawareness; insulin dose should be reduced regardless   Plan: Reduce Lantus to 25 units    Hyperlipidemia    LDL goal < 100  Patient has failed these meds in past: n/a Patient is currently controlled on the following medications:   Rosuvastatin 10 mg daily  Aspirin 81 mg daily   We discussed:  diet and exercise extensively; cholesterol goals and benefit of  statin for ASCVD risk reduction.   Plan: Continue current medications and control with diet and exercise  Patient Goals/Self-Care Activities . Patient will:  - take medications as prescribed focus on medication adherence by pill box check glucose via Dexcom, document, and provide at future appointments  -Reduce Lantus to 25 units -Keep PCP appt 08/26/20  Follow Up Plan: Telephone follow up appointment with care management team member scheduled for: 6 months      Medication Assistance:  -pt is able to get Brand name meds for free or cheap because he still has Pharmacist, community and is able to use coupons  Patient's preferred pharmacy is:  [id number] [place] [address] [address]Phone: [phone]: [phone] [place] [address]Phone: [phone]: [phone] Uses pill box? Yes Pt endorses 100% compliance  We discussed: Current pharmacy is preferred with insurance plan and patient is satisfied with pharmacy services  Patient decided to: Continue current medication management strategy  Care Plan and Follow Up Patient Decision:  Patient agrees to Care Plan and Follow-up.  Plan: Telephone follow up appointment with care management team member scheduled for:  6 months  LCharlene Brooke PharmD, [place] CPP Clinical Pharmacist [phone]

## 2020-07-15 ENCOUNTER — Other Ambulatory Visit: Payer: Self-pay | Admitting: Internal Medicine

## 2020-07-30 ENCOUNTER — Other Ambulatory Visit: Payer: Self-pay | Admitting: Internal Medicine

## 2020-07-30 DIAGNOSIS — E118 Type 2 diabetes mellitus with unspecified complications: Secondary | ICD-10-CM

## 2020-07-30 DIAGNOSIS — Z794 Long term (current) use of insulin: Secondary | ICD-10-CM

## 2020-07-31 ENCOUNTER — Other Ambulatory Visit: Payer: Self-pay | Admitting: Internal Medicine

## 2020-07-31 DIAGNOSIS — Z794 Long term (current) use of insulin: Secondary | ICD-10-CM

## 2020-07-31 DIAGNOSIS — E118 Type 2 diabetes mellitus with unspecified complications: Secondary | ICD-10-CM

## 2020-07-31 MED ORDER — BYDUREON BCISE 2 MG/0.85ML ~~LOC~~ AUIJ: 1.0000 | AUTO-INJECTOR | SUBCUTANEOUS | 1 refills | Status: DC

## 2020-08-26 ENCOUNTER — Other Ambulatory Visit: Payer: Self-pay

## 2020-08-26 ENCOUNTER — Ambulatory Visit: Payer: Medicare Other | Admitting: Internal Medicine

## 2020-08-26 ENCOUNTER — Encounter: Payer: Self-pay | Admitting: Internal Medicine

## 2020-08-26 VITALS — BP 130/78 | HR 80 | Temp 98.2°F | Resp 16 | Ht 70.0 in | Wt 176.0 lb

## 2020-08-26 DIAGNOSIS — E039 Hypothyroidism, unspecified: Secondary | ICD-10-CM

## 2020-08-26 DIAGNOSIS — I10 Essential (primary) hypertension: Secondary | ICD-10-CM

## 2020-08-26 DIAGNOSIS — E118 Type 2 diabetes mellitus with unspecified complications: Secondary | ICD-10-CM

## 2020-08-26 LAB — BASIC METABOLIC PANEL
BUN: 24 mg/dL — ABNORMAL HIGH (ref 6–23)
CO2: 24 mEq/L (ref 19–32)
Calcium: 9.7 mg/dL (ref 8.4–10.5)
Chloride: 104 mEq/L (ref 96–112)
Creatinine, Ser: 1.07 mg/dL (ref 0.40–1.50)
GFR: 70.61 mL/min (ref >60.00)
Glucose, Bld: 137 mg/dL — ABNORMAL HIGH (ref 70–99)
Potassium: 4.2 mEq/L (ref 3.5–5.1)
Sodium: 138 mEq/L (ref 135–145)

## 2020-08-26 LAB — HEMOGLOBIN A1C: Hgb A1c MFr Bld: 8 % — ABNORMAL HIGH (ref 4.6–6.5)

## 2020-08-26 LAB — TSH: TSH: 6.79 u[IU]/mL — ABNORMAL HIGH (ref 0.35–4.50)

## 2020-08-26 MED ORDER — LANTUS SOLOSTAR 100 UNIT/ML ~~LOC~~ SOPN: 20.0000 [IU] | PEN_INJECTOR | Freq: Every day | SUBCUTANEOUS | 1 refills | Status: DC

## 2020-08-26 MED ORDER — OZEMPIC (2 MG/DOSE) 8 MG/3ML ~~LOC~~ SOPN: 2.0000 mg | PEN_INJECTOR | SUBCUTANEOUS | 1 refills | Status: DC

## 2020-08-26 NOTE — Progress Notes (Signed)
Subjective:  Patient ID: Clifford Brown, male    DOB: [birth year]  Age: 70 y.o. MRN: [id number]  CC: Diabetes  This visit occurred during the SARS-CoV-2 public health emergency.  Safety protocols were in place, including screening questions prior to the visit, additional usage of staff PPE, and extensive cleaning of exam room while observing appropriate contact time as indicated for disinfecting solutions.    HPI Clifford Brown presents for f/up -  He wants to change Bydureon to something else because it is too expensive.  He has been working on his lifestyle modifications and denies any recent episodes of diaphoresis, chest pain, dizziness, lightheadedness, edema, or polys.  In the last month he has had 1 episode of hypoglycemia with a blood sugar down to 52.  It responded well to peanut butter.  Outpatient Medications Prior to Visit  Medication Sig Dispense Refill   aspirin EC 81 MG tablet Take 1 tablet (81 mg total) by mouth daily. 90 tablet 3   B-D ULTRAFINE III SHORT PEN 31G X 8 MM MISC USE DAILY WITH INSULIN 100 each 1   Continuous Blood Gluc Receiver (DEXCOM G6 RECEIVER) DEVI 1 Act by Does not apply route daily. 1 each 5   Continuous Blood Gluc Sensor (DEXCOM G6 SENSOR) MISC 1 Act by Does not apply route daily. 1 each 5   Continuous Blood Gluc Transmit (DEXCOM G6 TRANSMITTER) MISC 1 Act by Does not apply route daily. 1 each 5   JARDIANCE 25 MG TABS tablet TAKE 1 TABLET BY MOUTH EVERY DAY 90 tablet 1   metFORMIN (GLUCOPHAGE-XR) 750 MG 24 hr tablet TAKE 2 TABLETS BY MOUTH DAILY WITH BREAKFAST. 180 tablet 1   rosuvastatin (CRESTOR) 10 MG tablet TAKE 1 TABLET BY MOUTH EVERY DAY 90 tablet 1   telmisartan (MICARDIS) 40 MG tablet TAKE 1 TABLET BY MOUTH EVERY DAY 90 tablet 1   Exenatide ER (BYDUREON BCISE) 2 MG/0.85ML AUIJ Inject 1 Act into the skin once a week. 10.2 mL 1   FREESTYLE LITE test strip USE AS DIRECTED 3 TIMES A DAY 300 each 3   insulin glargine (LANTUS SOLOSTAR) 100 UNIT/ML  Solostar Pen Inject 25 Units into the skin daily. 45 mL 1   No facility-administered medications prior to visit.    ROS Review of Systems  Constitutional:  Negative for diaphoresis and fatigue.  HENT: Negative.    Eyes:  Negative for visual disturbance.  Respiratory:  Negative for cough, chest tightness, shortness of breath and wheezing.   Cardiovascular:  Negative for chest pain, palpitations and leg swelling.  Gastrointestinal:  Negative for abdominal pain, diarrhea, nausea and vomiting.  Endocrine: Negative.  Negative for cold intolerance, heat intolerance, polydipsia, polyphagia and polyuria.  Genitourinary: Negative.  Negative for difficulty urinating.  Musculoskeletal:  Negative for arthralgias and myalgias.  Skin: Negative.  Negative for color change and pallor.  Neurological: Negative.  Negative for dizziness, weakness, light-headedness and numbness.  Hematological:  Negative for adenopathy. Does not bruise/bleed easily.  Psychiatric/Behavioral: Negative.     Objective:  BP 130/78 (BP Location: Right Arm, Patient Position: Sitting, Cuff Size: Large)   Pulse 80   Temp 98.2 F (36.8 C) (Oral)   Resp 16   Ht 5\' 10"  (1.778 m)   Wt 176 lb (79.8 kg)   SpO2 96%   BMI 25.25 kg/m   BP Readings from Last 3 Encounters:  08/26/20 130/78  05/12/20 124/76  12/12/19 136/86    Wt Readings from Last 3 Encounters:  08/26/20  176 lb (79.8 kg)  05/12/20 181 lb (82.1 kg)  12/12/19 176 lb (79.8 kg)    Physical Exam Vitals reviewed.  HENT:     Nose: Nose normal.     Mouth/Throat:     Mouth: Mucous membranes are moist.  Eyes:     General: No scleral icterus.    Conjunctiva/sclera: Conjunctivae normal.  Cardiovascular:     Rate and Rhythm: Normal rate and regular rhythm.     Heart sounds: No murmur heard. Pulmonary:     Effort: Pulmonary effort is normal.     Breath sounds: No stridor. No wheezing, rhonchi or rales.  Abdominal:     General: Abdomen is protuberant. Bowel  sounds are normal. There is no distension.     Palpations: Abdomen is soft. There is no hepatomegaly, splenomegaly or mass.     Tenderness: There is no abdominal tenderness.  Musculoskeletal:        General: Normal range of motion.     Cervical back: Neck supple.     Right lower leg: No edema.     Left lower leg: No edema.  Lymphadenopathy:     Cervical: No cervical adenopathy.  Skin:    General: Skin is warm and dry.  Neurological:     General: No focal deficit present.     Mental Status: He is alert.    Lab Results  Component Value Date   WBC 5.2 05/12/2020   HGB 13.9 05/12/2020   HCT 41.7 05/12/2020   PLT 195.0 05/12/2020   GLUCOSE 137 (H) 08/26/2020   CHOL 141 05/12/2020   TRIG 74.0 05/12/2020   HDL 67.70 05/12/2020   LDLDIRECT 183.6 01/31/2011   LDLCALC 58 05/12/2020   ALT 20 05/12/2020   AST 16 05/12/2020   NA 138 08/26/2020   K 4.2 08/26/2020   CL 104 08/26/2020   CREATININE 1.07 08/26/2020   BUN 24 (H) 08/26/2020   CO2 24 08/26/2020   TSH 6.79 (H) 08/26/2020   PSA 0.00 (L) 05/12/2020   HGBA1C 8.0 (H) 08/26/2020   MICROALBUR <0.7 05/12/2020    DG Chest 2 View  Result Date: 04/10/2018 CLINICAL DATA:  Cough EXAM: CHEST - 2 VIEW COMPARISON:  12/03/2008 FINDINGS: Heart and mediastinal contours are within normal limits. No focal opacities or effusions. No acute bony abnormality. IMPRESSION: No active cardiopulmonary disease. Electronically Signed   By: Rolm Baptise M.D.   On: 04/10/2018 17:15    Assessment & Plan:   Timm was seen today for diabetes.  Diagnoses and all orders for this visit:  Essential hypertension, benign- His blood pressure is adequately well controlled. -     Basic metabolic panel; Future -     Basic metabolic panel  Type II diabetes mellitus with manifestations ([place])- His A1c is too high at 8.0%.  Will change to a more potent GLP-1 agonist.  Will decrease his basal insulin dosage to 20 units a day. -     Basic metabolic panel;  Future -     Hemoglobin A1c; Future -     Hemoglobin A1c -     Basic metabolic panel -     Semaglutide, 2 MG/DOSE, (OZEMPIC, 2 MG/DOSE,) 8 MG/3ML SOPN; Inject 2 mg into the skin once a week. -     insulin glargine (LANTUS SOLOSTAR) 100 UNIT/ML Solostar Pen; Inject 20 Units into the skin daily.  Acquired hypothyroidism- Clinically he is euthyroid.  This is consistent with subclinical hypothyroidism.  Thyroid replacement therapy is not indicated. -  TSH; Future -     TSH  I have discontinued Ingram Rammel "Gene"'s FREESTYLE LITE and Bydureon BCise. I have also changed his Lantus SoloStar. Additionally, I am having him start on Ozempic (2 MG/DOSE). Lastly, I am having him maintain his aspirin EC, B-D ULTRAFINE III SHORT PEN, Dexcom G6 Receiver, Dexcom G6 Transmitter, Dexcom G6 Sensor, metFORMIN, Jardiance, rosuvastatin, and telmisartan.  Meds ordered this encounter  Medications   Semaglutide, 2 MG/DOSE, (OZEMPIC, 2 MG/DOSE,) 8 MG/3ML SOPN    Sig: Inject 2 mg into the skin once a week.    Dispense:  9 mL    Refill:  1   insulin glargine (LANTUS SOLOSTAR) 100 UNIT/ML Solostar Pen    Sig: Inject 20 Units into the skin daily.    Dispense:  45 mL    Refill:  1    DX Code Needed  .      Follow-up: Return in about 6 months (around 02/25/2021).  [name], MD

## 2020-08-26 NOTE — Patient Instructions (Signed)

## 2020-09-09 ENCOUNTER — Encounter: Payer: Self-pay | Admitting: Internal Medicine

## 2020-09-09 ENCOUNTER — Other Ambulatory Visit: Payer: Self-pay | Admitting: Internal Medicine

## 2020-09-16 ENCOUNTER — Other Ambulatory Visit: Payer: Self-pay | Admitting: Internal Medicine

## 2020-09-16 ENCOUNTER — Encounter: Payer: Self-pay | Admitting: Internal Medicine

## 2020-09-16 DIAGNOSIS — E118 Type 2 diabetes mellitus with unspecified complications: Secondary | ICD-10-CM

## 2020-09-16 MED ORDER — RYBELSUS 3 MG PO TABS: 3.0000 mg | ORAL_TABLET | Freq: Every day | ORAL | 0 refills | Status: DC

## 2020-09-17 ENCOUNTER — Other Ambulatory Visit: Payer: Self-pay | Admitting: Internal Medicine

## 2020-09-17 DIAGNOSIS — E118 Type 2 diabetes mellitus with unspecified complications: Secondary | ICD-10-CM

## 2020-09-17 MED ORDER — TRULICITY 1.5 MG/0.5ML ~~LOC~~ SOAJ: 1.5000 mg | SUBCUTANEOUS | 0 refills | Status: DC

## 2020-10-06 ENCOUNTER — Telehealth: Payer: Self-pay | Admitting: Pharmacist

## 2020-10-06 NOTE — Chronic Care Management (AMB) (Signed)
Chronic Care Management Pharmacy Assistant   Name: Clifford Brown  MRN: [id number] DOB: June 12, 1950   Reason for Encounter: Disease State - Diabetes    Recent office visits:  08/26/20 [name]. MD (PCP) - Hypertension Follow up- labs ordered, started on Ozempic (2 MG/DOSE) new prescription given, Discontinued Freestyle Lite and Bydureon Bcise. Change Lantus  to 20 units daily. Follow up in 6 months.   Recent consult visits:  None noted.   Hospital visits:  None in previous 6 months  Medications: Outpatient Encounter Medications as of 10/06/2020  Medication Sig   B-D ULTRAFINE III SHORT PEN 31G X 8 MM MISC USE DAILY WITH INSULIN   Continuous Blood Gluc Receiver (DEXCOM G6 RECEIVER) DEVI 1 Act by Does not apply route daily.   Continuous Blood Gluc Sensor (DEXCOM G6 SENSOR) MISC 1 Act by Does not apply route daily.   Continuous Blood Gluc Transmit (DEXCOM G6 TRANSMITTER) MISC 1 Act by Does not apply route daily.   Dulaglutide (TRULICITY) 1.5 0000000 SOPN Inject 1.5 mg into the skin once a week.   insulin glargine (LANTUS SOLOSTAR) 100 UNIT/ML Solostar Pen Inject 20 Units into the skin daily.   JARDIANCE 25 MG TABS tablet TAKE 1 TABLET BY MOUTH EVERY DAY   metFORMIN (GLUCOPHAGE-XR) 750 MG 24 hr tablet TAKE 2 TABLETS BY MOUTH DAILY WITH BREAKFAST.   rosuvastatin (CRESTOR) 10 MG tablet TAKE 1 TABLET BY MOUTH EVERY DAY   telmisartan (MICARDIS) 40 MG tablet TAKE 1 TABLET BY MOUTH EVERY DAY   No facility-administered encounter medications on file as of 10/06/2020.    Recent Relevant Labs: Lab Results  Component Value Date/Time   HGBA1C 8.0 (H) 08/26/2020 08:57 AM   HGBA1C 8.6 (H) 05/12/2020 08:43 AM   MICROALBUR <0.7 05/12/2020 08:43 AM   MICROALBUR 2.2 (H) 05/30/2019 10:18 AM    Kidney Function Lab Results  Component Value Date/Time   CREATININE 1.07 08/26/2020 08:57 AM   CREATININE 1.07 05/12/2020 08:43 AM   CREATININE 1.08 12/12/2019 09:50 AM   GFR 70.61 08/26/2020  08:57 AM   GFRNONAA 70 12/12/2019 09:50 AM   GFRAA 81 12/12/2019 09:50 AM    Current antihyperglycemic regimen:  insulin glargine (LANTUS SOLOSTAR) 100 UNIT/ML Solostar Pen 22 units daily  metFORMIN (GLUCOPHAGE-XR) 750 MG 24 hr tablet 1 tablet twice daily JARDIANCE 25 MG TABS tablet 1 tablet by mouth daily    What recent interventions/DTPs have been made to improve glycemic control:  Patients Lantus was changed to 20 units daily. Patient reports he is now doing 22 units daily of Lantus daily.  Have there been any recent hospitalizations or ED visits since last visit with CPP? No Patient reports hypoglycemic symptoms, including Shaky Patient denies hyperglycemic symptoms, including none How often are you checking your blood sugar? 3-4 times daily He currently is using Dexcom G6 continuously monitoring.   What are your blood sugars ranging?  Fasting: 100 Before meals: 100-120 After meals: mid 200 Bedtime: 200-225  During the week, how often does your blood glucose drop below 70? Patient reports he has had a episode this week but resolved once he ate some graham crackers and peanut butter his blood sugar came up to over 100.  Are you checking your feet daily/regularly?  Patient reports he is checking his feet regularly.   Adherence Review: Is the patient currently on a STATIN medication? Yes Is the patient currently on ACE/ARB medication? Yes Does the patient have >5 day gap between last estimated fill dates?  No  Medication Adherence Review:  insulin glargine (LANTUS SOLOSTAR) 100 UNIT/ML Solostar Pen - last filled 08/29/20 75 days  metFORMIN (GLUCOPHAGE-XR) 750 MG 24 hr tablet - last filled 07/21/20 90 days  Dulaglutide (TRULICITY) 1.5 0000000 SOPN - patient reports he is no longer taking JARDIANCE 25 MG TABS tablet - last filled 08/24/20 90 days  Bydureon Bcise '2mg'$  once weekly - last filled 05/26/20 28 days patient reports he is no longer taking.    Star Rating  Drugs: metFORMIN (GLUCOPHAGE-XR) 750 MG 24 hr tablet - last filled 07/21/20 90 days  rosuvastatin (CRESTOR) 10 MG tablet - last filled 08/19/20 90 days  telmisartan (MICARDIS) 40 MG tablet - last filled 08/19/20 90 days   Jobe Gibbon, CCMA Clinical Pharmacist Assistant  [phone]  Time Spent: 40 minutes

## 2020-10-15 ENCOUNTER — Other Ambulatory Visit: Payer: Self-pay | Admitting: Internal Medicine

## 2020-10-17 ENCOUNTER — Other Ambulatory Visit: Payer: Self-pay | Admitting: Internal Medicine

## 2020-10-17 DIAGNOSIS — E118 Type 2 diabetes mellitus with unspecified complications: Secondary | ICD-10-CM

## 2020-10-18 ENCOUNTER — Other Ambulatory Visit: Payer: Self-pay | Admitting: Internal Medicine

## 2020-10-18 DIAGNOSIS — E118 Type 2 diabetes mellitus with unspecified complications: Secondary | ICD-10-CM

## 2020-10-18 MED ORDER — TRULICITY 3 MG/0.5ML ~~LOC~~ SOAJ: 3.0000 mg | SUBCUTANEOUS | 0 refills | Status: DC

## 2020-11-05 ENCOUNTER — Other Ambulatory Visit: Payer: Self-pay

## 2020-11-05 ENCOUNTER — Ambulatory Visit (INDEPENDENT_AMBULATORY_CARE_PROVIDER_SITE_OTHER): Payer: Medicare Other | Admitting: Pharmacist

## 2020-11-05 DIAGNOSIS — E118 Type 2 diabetes mellitus with unspecified complications: Secondary | ICD-10-CM | POA: Diagnosis not present

## 2020-11-05 DIAGNOSIS — I10 Essential (primary) hypertension: Secondary | ICD-10-CM | POA: Diagnosis not present

## 2020-11-05 DIAGNOSIS — E785 Hyperlipidemia, unspecified: Secondary | ICD-10-CM | POA: Diagnosis not present

## 2020-11-05 MED ORDER — RYBELSUS 7 MG PO TABS: 7.0000 mg | ORAL_TABLET | Freq: Every day | ORAL | 1 refills | Status: DC

## 2020-11-05 NOTE — Progress Notes (Signed)
Chronic Care Management Pharmacy Note  11/05/2020 Name:  Clifford Brown MRN:  [id number] DOB:  11/01/1950  Summary: -Pt is not currently taking a GLP-1 agonist - he never picked up Trulicity and stopped Rybelsus 3 mg after a couple weeks due to confusion over what to take; he reports Rybelsus copay was reasonable and he still has 1-2 weeks of Rybelsus 3 mg -Pt reports fasting BG 96-120; post-prandial BG up to 260s  Recommendations/Changes made from today's visit: -Restart Rybelsus 3 mg. Once completed, increase to 7 mg -May reduce Lantus by 2 units if fasting BG < 90   Subjective: Clifford Brown is an 70 y.o. year old male who is a primary patient of [name], MD.  The CCM team was consulted for assistance with disease management and care coordination needs.    Engaged with patient by telephone for follow up visit in response to provider referral for pharmacy case management and/or care coordination services.   Consent to Services:  The patient was given information about Chronic Care Management services, agreed to services, and gave verbal consent prior to initiation of services.  Please see initial visit note for detailed documentation.   Patient Care Team: [name], MD as PCP - General Lenord Fellers Cleaster Corin, [facility] as Pharmacist (Pharmacist)  Recent office visits: 08/26/20 Dr [name] OV: chronic f/u; A1c 8.0 (from 8.6); d/c Bydureon and start Ozempic 2 mg.  05/12/20 Dr [name] OV: chronic f/u; TSH stable in subclinical range, TRT not recommended; advised to work on lifestyle for DM; ordered Dexcom G6   Recent consult visits: [facility] visits: None in previous 6 months  Objective:  Lab Results  Component Value Date   CREATININE 1.07 08/26/2020   BUN 24 (H) 08/26/2020   GFR 70.61 08/26/2020   GFRNONAA 70 12/12/2019   GFRAA 81 12/12/2019   NA 138 08/26/2020   K 4.2 08/26/2020   CALCIUM 9.7 08/26/2020   CO2 24 08/26/2020   GLUCOSE 137 (H) 08/26/2020    Lab  Results  Component Value Date/Time   HGBA1C 8.0 (H) 08/26/2020 08:57 AM   HGBA1C 8.6 (H) 05/12/2020 08:43 AM   GFR 70.61 08/26/2020 08:57 AM   GFR 70.76 05/12/2020 08:43 AM   MICROALBUR <0.7 05/12/2020 08:43 AM   MICROALBUR 2.2 (H) 05/30/2019 10:18 AM    Last diabetic Eye exam:  Lab Results  Component Value Date/Time   HMDIABEYEEXA No Retinopathy 07/07/2020 12:00 AM    Last diabetic Foot exam:  Lab Results  Component Value Date/Time   HMDIABFOOTEX done 09/20/2017 12:00 AM     Lab Results  Component Value Date   CHOL 141 05/12/2020   HDL 67.70 05/12/2020   LDLCALC 58 05/12/2020   LDLDIRECT 183.6 01/31/2011   TRIG 74.0 05/12/2020   CHOLHDL 2 05/12/2020    Hepatic Function Latest Ref Rng & Units 05/12/2020 05/30/2019 04/10/2018  Total Protein 6.0 - 8.3 g/dL 6.1 7.0 6.7  Albumin 3.5 - 5.2 g/dL 3.9 4.0 4.3  AST 0 - 37 U/L '16 18 19  ' ALT 0 - 53 U/L '20 23 24  ' Alk Phosphatase 39 - 117 U/L 70 84 78  Total Bilirubin 0.2 - 1.2 mg/dL 0.5 0.6 0.7  Bilirubin, Direct 0.0 - 0.3 mg/dL 0.1 0.1 -    Lab Results  Component Value Date/Time   TSH 6.79 (H) 08/26/2020 08:57 AM   TSH 6.27 (H) 05/12/2020 08:43 AM   FREET4 0.77 07/28/2015 09:54 AM   FREET4 0.69 03/30/2015 09:58 AM  CBC Latest Ref Rng & Units 05/12/2020 05/30/2019 04/10/2018  WBC 4.0 - 10.5 K/uL 5.2 5.5 5.4  Hemoglobin 13.0 - 17.0 g/dL 13.9 15.6 15.8  Hematocrit 39.0 - 52.0 % 41.7 46.6 47.1  Platelets 150.0 - 400.0 K/uL 195.0 211.0 192.0    No results found for: VD25OH  Clinical ASCVD: No  The 10-year ASCVD risk score Mikey Bussing DC Jr., et al., 2013) is: 28.4%   Values used to calculate the score:     Age: 86 years     Sex: Male     Is Non-Hispanic African American: No     Diabetic: Yes     Tobacco smoker: No     Systolic Blood Pressure: 409 mmHg     Is BP treated: Yes     HDL Cholesterol: 67.7 mg/dL     Total Cholesterol: 141 mg/dL    Depression screen [facility] 2/9 11/27/2019 11/21/2018 04/10/2018  Decreased Interest 0 0 0   Down, Depressed, Hopeless 0 1 0  PHQ - 2 Score 0 1 0  Altered sleeping - 2 0  Tired, decreased energy - 0 0  Change in appetite - 0 0  Feeling bad or failure about yourself  - 0 0  Trouble concentrating - 0 0  Moving slowly or fidgety/restless - 0 0  Suicidal thoughts - 0 0  PHQ-9 Score - 3 0  Difficult doing work/chores - Not difficult at all Not difficult at all     Social History   Tobacco Use  Smoking Status Never  Smokeless Tobacco Never   BP Readings from Last 3 Encounters:  08/26/20 130/78  05/12/20 124/76  12/12/19 136/86   Pulse Readings from Last 3 Encounters:  08/26/20 80  05/12/20 92  12/12/19 84   Wt Readings from Last 3 Encounters:  08/26/20 176 lb (79.8 kg)  05/12/20 181 lb (82.1 kg)  12/12/19 176 lb (79.8 kg)   BMI Readings from Last 3 Encounters:  08/26/20 25.25 kg/m  05/12/20 25.97 kg/m  12/12/19 25.25 kg/m    Assessment/Interventions: Review of patient past medical history, allergies, medications, health status, including review of consultants reports, laboratory and other test data, was performed as part of comprehensive evaluation and provision of chronic care management services.   SDOH:  (Social Determinants of Health) assessments and interventions performed: Yes  SDOH Screenings   Alcohol Screen: Not on file  Depression (PHQ2-9): Low Risk    PHQ-2 Score: 0  Financial Resource Strain: Low Risk    Difficulty of Paying Living Expenses: Not hard at all  Food Insecurity: No Food Insecurity   Worried About Charity fundraiser in the Last Year: Never true   Ran Out of Food in the Last Year: Never true  Housing: Low Risk    Last Housing Risk Score: 0  Physical Activity: Sufficiently Active   Days of Exercise per Week: 5 days   Minutes of Exercise per Session: 30 min  Social Connections: Engineer, building services of Communication with Friends and Family: More than three times a week   Frequency of Social Gatherings with Friends  and Family: More than three times a week   Attends Religious Services: More than 4 times per year   Active Member of [employer] or Organizations: Yes   Attends Music therapist: More than 4 times per year   Marital Status: Married  Stress: No Stress Concern Present   Feeling of Stress : Not at all  Tobacco Use: Low Risk  Smoking Tobacco Use: Never   Smokeless Tobacco Use: Never  Transportation Needs: No Transportation Needs   Lack of Transportation (Medical): No   Lack of Transportation (Non-Medical): No    CCM Care Plan  No Known Allergies  Medications Reviewed Today     Reviewed by Charlton Haws, [facility] (Pharmacist) on 11/05/20 at 10  Med List Status: <None>   Medication Order Taking? Sig Documenting Provider Last Dose Status Informant  B-D ULTRAFINE III SHORT PEN 31G X 8 MM MISC [id number] Yes USE DAILY WITH INSULIN [name], MD Taking Active   Continuous Blood Gluc Receiver ([employer]) DEVI [id number] Yes 1 Act by Does not apply route daily. [name], MD Taking Active   Continuous Blood Gluc Sensor (DEXCOM G6 SENSOR) MISC [id number] Yes 1 Act by Does not apply route daily. [name], MD Taking Active   Continuous Blood Gluc Transmit (DEXCOM G6 TRANSMITTER) MISC [id number] Yes 1 Act by Does not apply route daily. [name], MD Taking Active   insulin glargine (LANTUS SOLOSTAR) 100 UNIT/ML Solostar Pen [id number] Yes Inject 20 Units into the skin daily. [name], MD Taking Active   JARDIANCE 25 MG TABS tablet [id number] Yes TAKE 1 TABLET BY MOUTH EVERY DAY [name], MD Taking Active   metFORMIN (GLUCOPHAGE-XR) 750 MG 24 hr tablet [id number] Yes TAKE 2 TABLETS BY MOUTH DAILY WITH BREAKFAST. [name], MD Taking Active   rosuvastatin (CRESTOR) 10 MG tablet [id number] Yes TAKE 1 TABLET BY MOUTH EVERY DAY [name], MD Taking Active   telmisartan (MICARDIS) 40 MG tablet [id number] Yes TAKE 1 TABLET BY MOUTH EVERY DAY  [name], MD Taking Active             Patient Active Problem List   Diagnosis Date Noted   Carpal tunnel syndrome on both sides 04/29/2018   Allergic rhinitis due to pollen 07/28/2015   Hypothyroidism 03/30/2015   Insomnia 06/27/2013   Back pain, lumbosacral 04/01/2012   Dyslipidemia (high LDL; low HDL) 02/03/2012   Type II diabetes mellitus with manifestations ([place]) 02/02/2012   Routine general medical examination at a health care facility 01/31/2011   DEPRESSION 01/12/2010   Essential hypertension, benign 01/12/2010   PROSTATE CANCER, HX OF 01/12/2010    Immunization History  Administered Date(s) Administered   Fluad Quad(high Dose 65+) 11/21/2018   Influenza Whole 12/28/2011, 01/03/2013   Influenza, High Dose Seasonal PF 02/08/2017, 12/04/2017   Influenza,inj,Quad PF,6+ Mos 11/25/2014   Influenza-Unspecified 11/13/2019   PFIZER(Purple Top)SARS-COV-2 Vaccination 05/10/2019, 06/04/2019, 01/21/2020   Pneumococcal Conjugate-13 08/21/2014   Pneumococcal Polysaccharide-23 01/08/2013, 05/16/2018   Td 01/12/2010   Tdap 05/13/2020   Zoster, Live 08/21/2014    Conditions to be addressed/monitored:  Hypertension, Hyperlipidemia and Diabetes  Care Plan : [place]  Updates made by Charlton Haws, [place] since 11/05/2020 12:00 AM     Problem: Hypertension, Hyperlipidemia and Diabetes   Priority: High     Long-Range Goal: Disease management   Start Date: 07/14/2020  Expected End Date: 07/14/2021  This Visit's Progress: Not on track  Recent Progress: On track  Priority: High  Note:   Current Barriers:  Suboptimal therapeutic regimen for diabetes  Pharmacist Clinical Goal(s):  Patient will adhere to plan to optimize therapeutic regimen for diabetes as evidenced by report of adherence to recommended medication management changes through collaboration with PharmD and provider.   Interventions: 1:1 collaboration with [name], MD regarding  development and update of comprehensive plan of care as evidenced by provider attestation and co-signature Inter-disciplinary care team collaboration (see longitudinal plan of care) Comprehensive medication review performed; medication list updated in electronic medical record  Hypertension    BP goal < 130/80 Patient checks BP at home infrequently Patient home BP readings are ranging: n/a   Patient has failed these meds in the past: n/a Patient is currently controlled on the following medications:  Telmisartan 40 mg daily   We discussed: BP is near goal in office; he endorses compliance and denies issues   Plan: Continue current medications and control with diet and exercise   Diabetes    A1c goal < 7% Checking BG: daily Recent FBG Readings: 68 this morning; 96-120 Avg: 140-150 Post-prandial: up to 260s   Patient has failed these meds in past: Invokana, Toujeo, Engineer, agricultural, Janumet, Bydureon, [employer] (cost) Patient is currently uncontrolled on the following medications:  Metformin ER 750 mg BID Jardiance 25 mg daily Lantus 22 units daily PM Trulicity 3 mg weekly  - not taking Dexcom G6    We discussed: A1c of 8 correlates to average BG 180s; since fasting BG are very good, post-prandial BG is the problem; pt is not taking a GLP-1 agonist currently mainly due to confusion - he was switched from Bydureon to [place] in June but Ozempic was too expensive; he was then prescribed Rybelsus, which was affordable but had some high BG so this was switched to Trulicity which he never picked up; he still has Rybelsus 3 mg 1-2 week supply remaining -Counseled on hypoglycemia risk as Rybelsus dose is increased; advised to monitor BG closely, and can reduce Lantus by 2 units if fasting BG < 90   Plan: -D/C Trulicity (never started) -Restart Rybelsus 3 mg; increase to 7 mg once supply runs out -Reduce Lantus by 2 units if fasting BG < 90   Hyperlipidemia    LDL goal < 100  Patient has  failed these meds in past: n/a Patient is currently controlled on the following medications:  Rosuvastatin 10 mg daily Aspirin 81 mg daily   We discussed:  LDL is at goal; pt reports compliance with statin and denies issues   Plan: Continue current medications and control with diet and exercise  Patient Goals/Self-Care Activities Patient will:  - take medications as prescribed focus on medication adherence by pill box check glucose via Dexcom G6 -Restart Rybelsus 3 mg. Once completed, increase to 7 mg -May reduce Lantus by 2 units if fasting BG < 90     Medication Assistance:  -pt is able to get Brand name meds for free or cheap because he still has Pharmacist, community and is able to use coupons  Compliance/Adherence/Medication fill history: Care Gaps: Vaccines - Covid booster, Shingrix  Star-Rating Drugs: Jardiance - LF 08/24/20 x 90 ds Rosuvastatin - 08/19/20 x 90 ds Metformin - LF 10/16/20 x 90 ds Telmisartan - LF 08/19/20 x 90 ds  Patient's preferred pharmacy is:  [id number] [place] [place] - [address] [place]Phone: [phone]: [phone] [place] [address]Phone: [phone]: [phone] Uses pill box? Yes Pt endorses 100% compliance  We discussed: Current pharmacy is preferred with insurance plan and patient is satisfied  with pharmacy services  Patient decided to: Continue current medication management strategy  Care Plan and Follow Up Patient Decision:  Patient agrees to Care Plan and Follow-up.  Plan: Telephone follow up appointment with care management team member scheduled for:  1 months  Charlene Brooke, PharmD, [place], CPP Clinical Pharmacist [facility] at [facility] [phone]

## 2020-11-05 NOTE — Patient Instructions (Signed)
Visit Information  Phone number for Pharmacist: [phone]   Goals Addressed             This Visit's Progress    Manage My Medicine       Timeframe:  Long-Range Goal Priority:  High Start Date:         11/05/20                    Expected End Date:   05/08/21                    Follow Up Date Sept 2022   - call for medicine refill 2 or 3 days before it runs out - call if I am sick and can't take my medicine - keep a list of all the medicines I take; vitamins and herbals too  -Restart Rybelsus 3 mg. Once completed, increase to 7 mg -May reduce Lantus by 2 units if fasting BG < 90   Why is this important?   These steps will help you keep on track with your medicines.   Notes:         Care Plan : CCM Pharmacy Care Plan  Updates made by Charlton Haws, RPH since 11/05/2020 12:00 AM     Problem: Hypertension, Hyperlipidemia and Diabetes   Priority: High     Long-Range Goal: Disease management   Start Date: 07/14/2020  Expected End Date: 07/14/2021  This Visit's Progress: Not on track  Recent Progress: On track  Priority: High  Note:   Current Barriers:  Suboptimal therapeutic regimen for diabetes  Pharmacist Clinical Goal(s):  Patient will adhere to plan to optimize therapeutic regimen for diabetes as evidenced by report of adherence to recommended medication management changes through collaboration with PharmD and provider.   Interventions: 1:1 collaboration with [name], MD regarding development and update of comprehensive plan of care as evidenced by provider attestation and co-signature Inter-disciplinary care team collaboration (see longitudinal plan of care) Comprehensive medication review performed; medication list updated in electronic medical record  Hypertension    BP goal < 130/80 Patient checks BP at home infrequently Patient home BP readings are ranging: n/a   Patient has failed these meds in the past: n/a Patient is currently  controlled on the following medications:  Telmisartan 40 mg daily   We discussed: BP is near goal in office; he endorses compliance and denies issues   Plan: Continue current medications and control with diet and exercise   Diabetes    A1c goal < 7% Checking BG: daily Recent FBG Readings: 68 this morning; 96-120 Avg: 140-150 Post-prandial: up to 260s   Patient has failed these meds in past: Invokana, Toujeo, Engineer, agricultural, Janumet, Bydureon, [employer] (cost) Patient is currently uncontrolled on the following medications:  Metformin ER 750 mg BID Jardiance 25 mg daily Lantus 22 units daily PM Trulicity 3 mg weekly  - not taking Dexcom G6    We discussed: A1c of 8 correlates to average BG 180s; since fasting BG are very good, post-prandial BG is the problem; pt is not taking a GLP-1 agonist currently mainly due to confusion - he was switched from Bydureon to [place] in June but Ozempic was too expensive; he was then prescribed Rybelsus, which was affordable but had some high BG so this was switched to Trulicity which he never picked up; he still has Rybelsus 3 mg 1-2 week supply remaining -Counseled on hypoglycemia risk as Rybelsus dose is increased; advised to  monitor BG closely, and can reduce Lantus by 2 units if fasting BG < 90   Plan: -D/C Trulicity (never started) -Restart Rybelsus 3 mg; increase to 7 mg once supply runs out -Reduce Lantus by 2 units if fasting BG < 90   Hyperlipidemia    LDL goal < 100  Patient has failed these meds in past: n/a Patient is currently controlled on the following medications:  Rosuvastatin 10 mg daily Aspirin 81 mg daily   We discussed:  LDL is at goal; pt reports compliance with statin and denies issues   Plan: Continue current medications and control with diet and exercise  Patient Goals/Self-Care Activities Patient will:  - take medications as prescribed focus on medication adherence by pill box check glucose via Dexcom G6 -Restart  Rybelsus 3 mg. Once completed, increase to 7 mg -May reduce Lantus by 2 units if fasting BG < 90      Patient verbalizes understanding of instructions provided today and agrees to view in [place].  Telephone follow up appointment with pharmacy team member scheduled for: 1 month  Charlene Brooke, PharmD, [place], CPP Clinical Pharmacist [facility] at [facility] [phone]

## 2020-11-06 IMAGING — DX DG CHEST 2V
2 series · 2 of 2 positions shown · non-contrast
Comparison: 12/03/2008

CLINICAL DATA: Cough

EXAM:
CHEST - 2 VIEW

[chest pa]
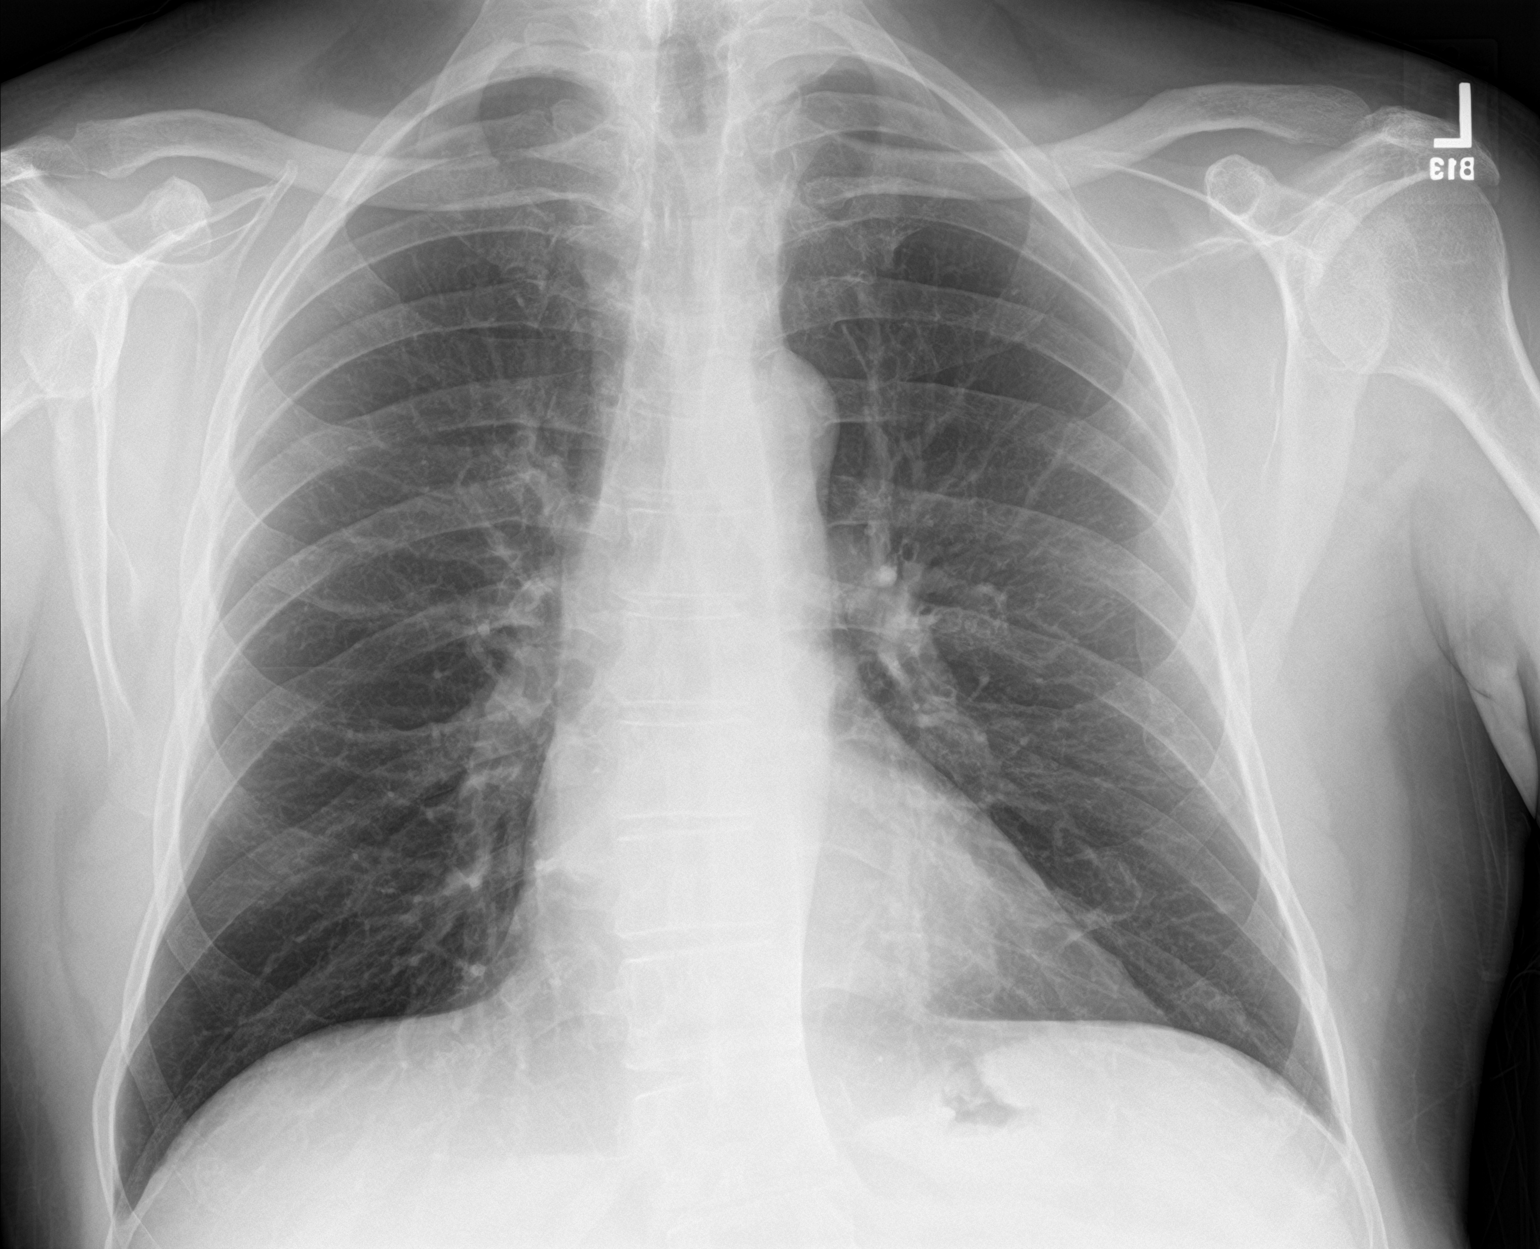

[chest lat]
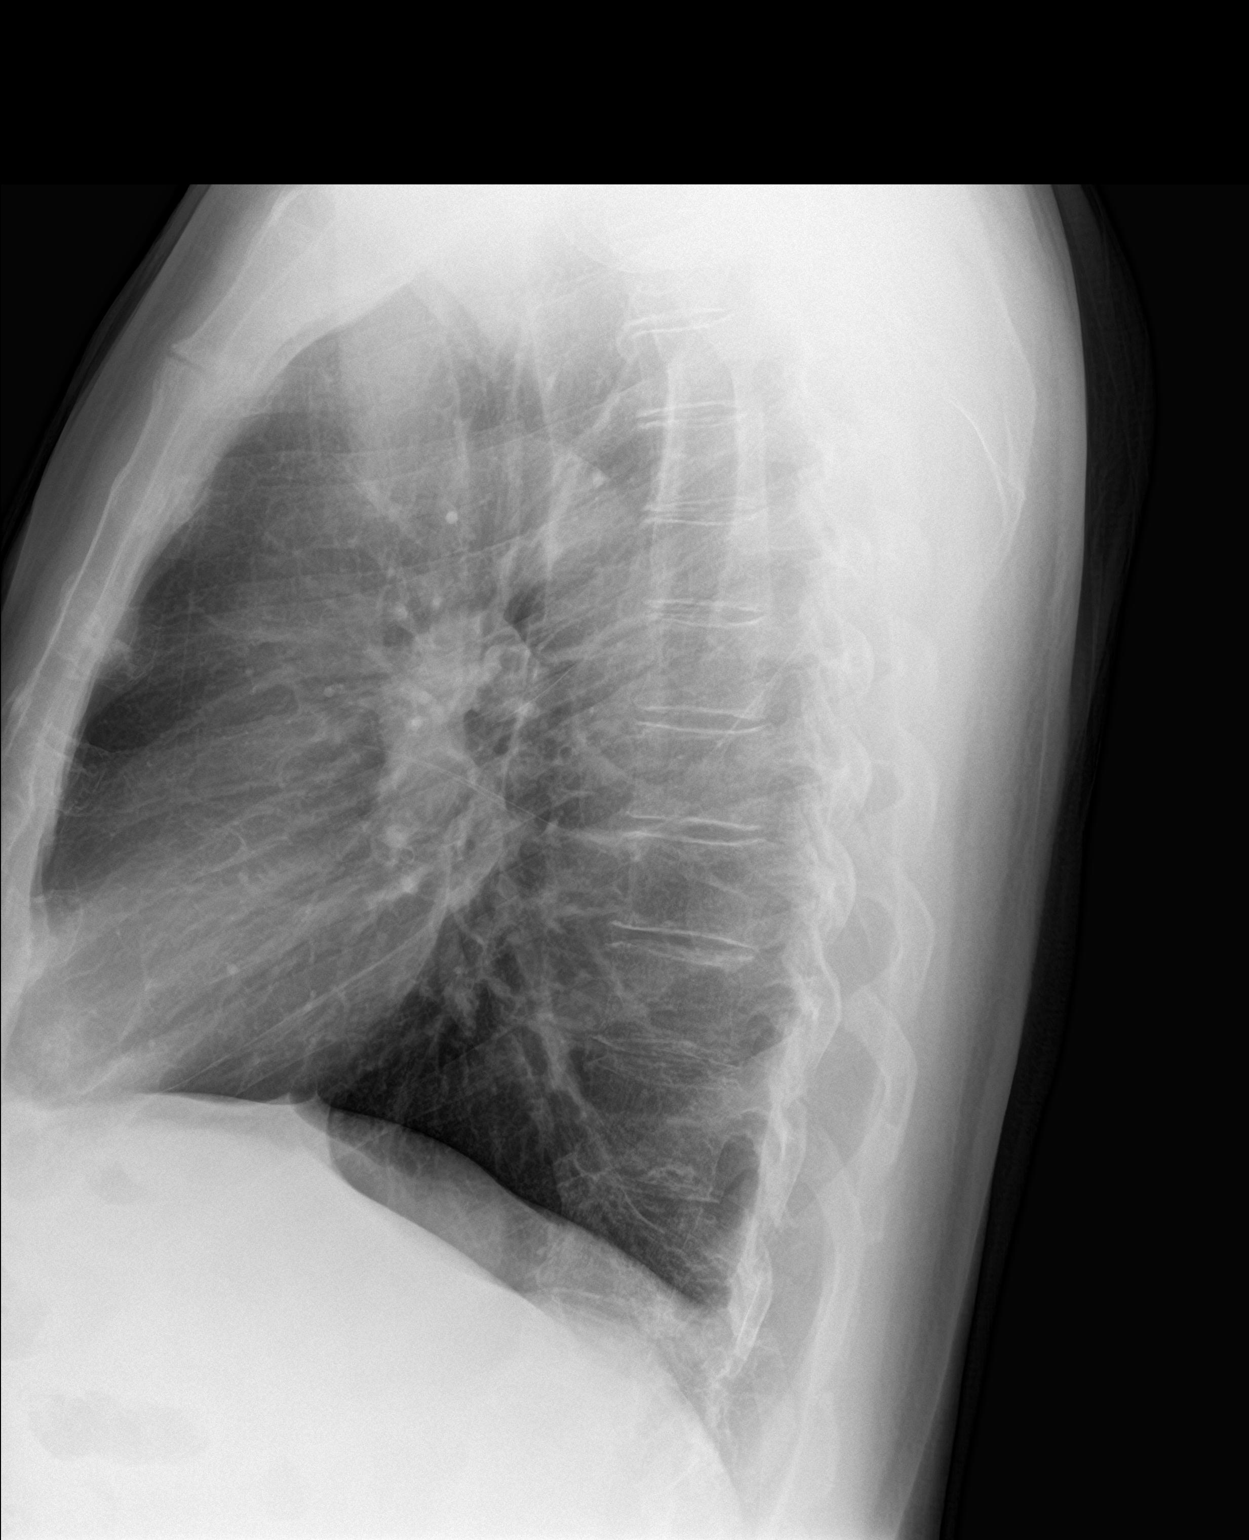

[2 of 2 positions shown; findings below may reference images not displayed]

FINDINGS: Heart and mediastinal contours are within normal limits. No focal
opacities or effusions. No acute bony abnormality.
IMPRESSION: No active cardiopulmonary disease.

## 2020-11-18 ENCOUNTER — Other Ambulatory Visit: Payer: Self-pay | Admitting: Internal Medicine

## 2020-11-18 DIAGNOSIS — E785 Hyperlipidemia, unspecified: Secondary | ICD-10-CM

## 2020-11-18 DIAGNOSIS — E118 Type 2 diabetes mellitus with unspecified complications: Secondary | ICD-10-CM

## 2020-11-18 DIAGNOSIS — I10 Essential (primary) hypertension: Secondary | ICD-10-CM

## 2020-12-09 ENCOUNTER — Encounter: Payer: Self-pay | Admitting: Internal Medicine

## 2021-01-04 ENCOUNTER — Other Ambulatory Visit: Payer: Self-pay | Admitting: Internal Medicine

## 2021-01-04 DIAGNOSIS — E118 Type 2 diabetes mellitus with unspecified complications: Secondary | ICD-10-CM

## 2021-01-11 ENCOUNTER — Other Ambulatory Visit: Payer: Self-pay | Admitting: Internal Medicine

## 2021-01-11 DIAGNOSIS — E118 Type 2 diabetes mellitus with unspecified complications: Secondary | ICD-10-CM

## 2021-01-11 DIAGNOSIS — Z794 Long term (current) use of insulin: Secondary | ICD-10-CM

## 2021-01-12 ENCOUNTER — Telehealth: Payer: Medicare Other

## 2021-01-20 ENCOUNTER — Encounter: Payer: Self-pay | Admitting: Internal Medicine

## 2021-01-20 ENCOUNTER — Ambulatory Visit: Payer: Medicare Other | Admitting: Internal Medicine

## 2021-01-20 ENCOUNTER — Other Ambulatory Visit: Payer: Self-pay

## 2021-01-20 VITALS — BP 126/78 | HR 90 | Temp 97.9°F | Resp 16 | Ht 70.0 in | Wt 176.0 lb

## 2021-01-20 DIAGNOSIS — E039 Hypothyroidism, unspecified: Secondary | ICD-10-CM

## 2021-01-20 DIAGNOSIS — I10 Essential (primary) hypertension: Secondary | ICD-10-CM

## 2021-01-20 DIAGNOSIS — Z0001 Encounter for general adult medical examination with abnormal findings: Secondary | ICD-10-CM

## 2021-01-20 DIAGNOSIS — E118 Type 2 diabetes mellitus with unspecified complications: Secondary | ICD-10-CM | POA: Insufficient documentation

## 2021-01-20 DIAGNOSIS — E785 Hyperlipidemia, unspecified: Secondary | ICD-10-CM | POA: Diagnosis not present

## 2021-01-20 DIAGNOSIS — Z23 Encounter for immunization: Secondary | ICD-10-CM

## 2021-01-20 DIAGNOSIS — Z794 Long term (current) use of insulin: Secondary | ICD-10-CM

## 2021-01-20 LAB — BASIC METABOLIC PANEL
BUN: 18 mg/dL (ref 6–23)
CO2: 28 mEq/L (ref 19–32)
Calcium: 9.4 mg/dL (ref 8.4–10.5)
Chloride: 102 mEq/L (ref 96–112)
Creatinine, Ser: 1.05 mg/dL (ref 0.40–1.50)
GFR: 72.03 mL/min (ref >60.00)
Glucose, Bld: 80 mg/dL (ref 70–99)
Potassium: 3.7 mEq/L (ref 3.5–5.1)
Sodium: 139 mEq/L (ref 135–145)

## 2021-01-20 LAB — TSH: TSH: 6.48 u[IU]/mL — ABNORMAL HIGH (ref 0.35–5.50)

## 2021-01-20 MED ORDER — EMPAGLIFLOZIN 25 MG PO TABS: 25.0000 mg | ORAL_TABLET | Freq: Every day | ORAL | 1 refills | Status: DC

## 2021-01-20 MED ORDER — METFORMIN HCL ER 750 MG PO TB24: 1500.0000 mg | ORAL_TABLET | Freq: Every day | ORAL | 1 refills | Status: DC

## 2021-01-20 MED ORDER — ROSUVASTATIN CALCIUM 10 MG PO TABS: 10.0000 mg | ORAL_TABLET | Freq: Every day | ORAL | 1 refills | Status: DC

## 2021-01-20 MED ORDER — TELMISARTAN 40 MG PO TABS
40.0000 mg | ORAL_TABLET | Freq: Every day | ORAL | 1 refills | Status: DC
Start: 2021-01-20 — End: 2021-08-02

## 2021-01-20 MED ORDER — DEXCOM G6 SENSOR MISC: 1.0000 | Freq: Every day | 5 refills | Status: DC

## 2021-01-20 MED ORDER — LANTUS SOLOSTAR 100 UNIT/ML ~~LOC~~ SOPN: 20.0000 [IU] | PEN_INJECTOR | Freq: Every day | SUBCUTANEOUS | 1 refills | Status: DC

## 2021-01-20 MED ORDER — RYBELSUS 7 MG PO TABS: 1.0000 | ORAL_TABLET | Freq: Every day | ORAL | 1 refills | Status: DC

## 2021-01-20 NOTE — Progress Notes (Signed)
Subjective:  Patient ID: Clifford Brown, male    DOB: 1951-02-24  Age: 70 y.o. MRN: [id number]  CC: Hypertension, Hypothyroidism, Diabetes, and Annual Exam  This visit occurred during the SARS-CoV-2 public health emergency.  Safety protocols were in place, including screening questions prior to the visit, additional usage of staff PPE, and extensive cleaning of exam room while observing appropriate contact time as indicated for disinfecting solutions.    HPI Clifford Brown presents for a CPX and f/up -  He is not consistently compliant with Rybelsus.  When he stops it and restarts that he has brief episodes of nausea but he denies abdominal pain, vomiting, or weight loss.  He continues to complain of episodes of hyperglycemia that are asymptomatic.  He continues to drink beer.  He is active and denies chest pain, shortness of breath, diaphoresis, dizziness, lightheadedness, or edema.  Outpatient Medications Prior to Visit  Medication Sig Dispense Refill   B-D ULTRAFINE III SHORT PEN 31G X 8 MM MISC USE DAILY WITH INSULIN 100 each 1   Continuous Blood Gluc Receiver (DEXCOM G6 RECEIVER) DEVI 1 Act by Does not apply route daily. 1 each 5   Continuous Blood Gluc Transmit (DEXCOM G6 TRANSMITTER) MISC 1 Act by Does not apply route daily. 1 each 5   Continuous Blood Gluc Sensor (DEXCOM G6 SENSOR) MISC 1 Act by Does not apply route daily. 1 each 5   insulin glargine (LANTUS SOLOSTAR) 100 UNIT/ML Solostar Pen Inject 20 Units into the skin daily. 45 mL 1   JARDIANCE 25 MG TABS tablet TAKE 1 TABLET BY MOUTH EVERY DAY 90 tablet 0   metFORMIN (GLUCOPHAGE-XR) 750 MG 24 hr tablet TAKE 2 TABLETS BY MOUTH DAILY WITH BREAKFAST. 180 tablet 1   rosuvastatin (CRESTOR) 10 MG tablet TAKE 1 TABLET BY MOUTH EVERY DAY 90 tablet 0   RYBELSUS 7 MG TABS TAKE 1 TABLET BY MOUTH EVERY DAY 90 tablet 0   telmisartan (MICARDIS) 40 MG tablet TAKE 1 TABLET BY MOUTH EVERY DAY 90 tablet 0   No facility-administered medications  prior to visit.    ROS Review of Systems  Constitutional:  Negative for diaphoresis, fatigue and unexpected weight change.  HENT:  Negative for sore throat.   Respiratory:  Negative for cough, chest tightness, shortness of breath and wheezing.   Cardiovascular:  Negative for chest pain, palpitations and leg swelling.  Gastrointestinal:  Negative for abdominal pain, constipation, diarrhea, nausea and vomiting.  Endocrine: Negative for polydipsia, polyphagia and polyuria.  Genitourinary: Negative.  Negative for difficulty urinating.  Musculoskeletal: Negative.  Negative for arthralgias and myalgias.  Skin: Negative.  Negative for color change.  Neurological: Negative.  Negative for dizziness and weakness.  Hematological:  Negative for adenopathy. Does not bruise/bleed easily.  Psychiatric/Behavioral: Negative.     Objective:  BP 126/78 (BP Location: Right Arm, Patient Position: Sitting, Cuff Size: Large)   Pulse 90   Temp 97.9 F (36.6 C) (Oral)   Resp 16   Ht 5\' 10"  (1.778 m)   Wt 176 lb (79.8 kg)   SpO2 98%   BMI 25.25 kg/m   BP Readings from Last 3 Encounters:  01/20/21 126/78  08/26/20 130/78  05/12/20 124/76    Wt Readings from Last 3 Encounters:  01/20/21 176 lb (79.8 kg)  08/26/20 176 lb (79.8 kg)  05/12/20 181 lb (82.1 kg)    Physical Exam Vitals reviewed.  HENT:     Mouth/Throat:     Mouth: Mucous membranes are moist.  Eyes:     Conjunctiva/sclera: Conjunctivae normal.  Cardiovascular:     Rate and Rhythm: Normal rate and regular rhythm.     Heart sounds: No murmur heard. Pulmonary:     Effort: Pulmonary effort is normal.     Breath sounds: No stridor. No wheezing, rhonchi or rales.  Abdominal:     General: Abdomen is flat.     Palpations: There is no mass.     Tenderness: There is no abdominal tenderness. There is no guarding.     Hernia: No hernia is present.  Musculoskeletal:        General: Normal range of motion.     Cervical back: Neck  supple.     Right lower leg: No edema.     Left lower leg: No edema.  Lymphadenopathy:     Cervical: No cervical adenopathy.  Skin:    General: Skin is warm and dry.  Neurological:     General: No focal deficit present.     Mental Status: He is alert.    Lab Results  Component Value Date   WBC 5.2 05/12/2020   HGB 13.9 05/12/2020   HCT 41.7 05/12/2020   PLT 195.0 05/12/2020   GLUCOSE 80 01/20/2021   CHOL 141 05/12/2020   TRIG 74.0 05/12/2020   HDL 67.70 05/12/2020   LDLDIRECT 183.6 01/31/2011   LDLCALC 58 05/12/2020   ALT 20 05/12/2020   AST 16 05/12/2020   NA 139 01/20/2021   K 3.7 01/20/2021   CL 102 01/20/2021   CREATININE 1.05 01/20/2021   BUN 18 01/20/2021   CO2 28 01/20/2021   TSH 6.48 (H) 01/20/2021   PSA 0.00 (L) 05/12/2020   HGBA1C 8.2 (H) 01/20/2021   MICROALBUR <0.7 05/12/2020    DG Chest 2 View  Result Date: 04/10/2018 CLINICAL DATA:  Cough EXAM: CHEST - 2 VIEW COMPARISON:  12/03/2008 FINDINGS: Heart and mediastinal contours are within normal limits. No focal opacities or effusions. No acute bony abnormality. IMPRESSION: No active cardiopulmonary disease. Electronically Signed   By: Rolm Baptise M.D.   On: 04/10/2018 17:15    Assessment & Plan:   Clifford Brown was seen today for hypertension, hypothyroidism, diabetes and annual exam.  Diagnoses and all orders for this visit:  Essential hypertension, benign- His blood pressure is adequately well controlled. -     Basic metabolic panel; Future -     TSH; Future -     telmisartan (MICARDIS) 40 MG tablet; Take 1 tablet (40 mg total) by mouth daily. -     TSH -     Basic metabolic panel  Acquired hypothyroidism- His TSH is in the acceptable range and clinically he is euthyroid. -     TSH; Future -     TSH  Type II diabetes mellitus with manifestations (HCC) -     Basic metabolic panel; Future -     Hemoglobin A1c; Future -     insulin glargine (LANTUS SOLOSTAR) 100 UNIT/ML Solostar Pen; Inject 20 Units  into the skin daily. -     empagliflozin (JARDIANCE) 25 MG TABS tablet; Take 1 tablet (25 mg total) by mouth daily. -     metFORMIN (GLUCOPHAGE-XR) 750 MG 24 hr tablet; Take 2 tablets (1,500 mg total) by mouth daily with breakfast. -     telmisartan (MICARDIS) 40 MG tablet; Take 1 tablet (40 mg total) by mouth daily. -     Continuous Blood Gluc Sensor (DEXCOM G6 SENSOR) MISC; 1 Act by Does  not apply route daily. -     HM Diabetes Foot Exam -     Hemoglobin A1c -     Basic metabolic panel  Type 2 diabetes mellitus with complication, without long-term current use of insulin (HCC) -     metFORMIN (GLUCOPHAGE-XR) 750 MG 24 hr tablet; Take 2 tablets (1,500 mg total) by mouth daily with breakfast. -     Semaglutide (RYBELSUS) 7 MG TABS; Take 1 tablet by mouth daily. -     telmisartan (MICARDIS) 40 MG tablet; Take 1 tablet (40 mg total) by mouth daily. -     HM Diabetes Foot Exam  Type 2 diabetes mellitus with complication, with long-term current use of insulin ([facility])- His A1c is up to 8.2%.  I recommended that he be more compliant with the GLP-1 agonist. -     metFORMIN (GLUCOPHAGE-XR) 750 MG 24 hr tablet; Take 2 tablets (1,500 mg total) by mouth daily with breakfast. -     telmisartan (MICARDIS) 40 MG tablet; Take 1 tablet (40 mg total) by mouth daily. -     HM Diabetes Foot Exam  Dyslipidemia (high LDL; low HDL)- LDL goal achieved. Doing well on the statin  -     rosuvastatin (CRESTOR) 10 MG tablet; Take 1 tablet (10 mg total) by mouth daily.  Encounter for general adult medical examination with abnormal findings- Exam completed, labs reviewed, vaccines reviewed and updated, cancer screenings are up-to-date, patient education was given.  Need for shingles vaccine -     Zoster Vaccine Adjuvanted [facility]) injection; Inject 0.5 mLs into the muscle once for 1 dose.  I have changed Clifford Brown "Clifford Brown"'s Jardiance to empagliflozin. I have also changed his metFORMIN, rosuvastatin, Rybelsus, and  telmisartan. I am also having him start on Shingrix. Additionally, I am having him maintain his B-D ULTRAFINE III SHORT PEN, Dexcom G6 Receiver, Dexcom G6 Transmitter, Lantus SoloStar, and Dexcom G6 Sensor.  Meds ordered this encounter  Medications   insulin glargine (LANTUS SOLOSTAR) 100 UNIT/ML Solostar Pen    Sig: Inject 20 Units into the skin daily.    Dispense:  45 mL    Refill:  1    DX Code Needed  .   empagliflozin (JARDIANCE) 25 MG TABS tablet    Sig: Take 1 tablet (25 mg total) by mouth daily.    Dispense:  90 tablet    Refill:  1   metFORMIN (GLUCOPHAGE-XR) 750 MG 24 hr tablet    Sig: Take 2 tablets (1,500 mg total) by mouth daily with breakfast.    Dispense:  180 tablet    Refill:  1   rosuvastatin (CRESTOR) 10 MG tablet    Sig: Take 1 tablet (10 mg total) by mouth daily.    Dispense:  90 tablet    Refill:  1   Semaglutide (RYBELSUS) 7 MG TABS    Sig: Take 1 tablet by mouth daily.    Dispense:  90 tablet    Refill:  1   telmisartan (MICARDIS) 40 MG tablet    Sig: Take 1 tablet (40 mg total) by mouth daily.    Dispense:  90 tablet    Refill:  1   Continuous Blood Gluc Sensor (DEXCOM G6 SENSOR) MISC    Sig: 1 Act by Does not apply route daily.    Dispense:  1 each    Refill:  5   Zoster Vaccine Adjuvanted [facility]) injection    Sig: Inject 0.5 mLs into the muscle once for  1 dose.    Dispense:  0.5 mL    Refill:  1      Follow-up: Return in about 6 months (around 07/20/2021).  [name], MD

## 2021-01-20 NOTE — Patient Instructions (Signed)
Type 2 Diabetes Mellitus, Diagnosis, Adult ?Type 2 diabetes (type 2 diabetes mellitus) is a long-term, or chronic, disease. In type 2 diabetes, one or both of these problems may be present: ?The pancreas does not make enough of a hormone called insulin. ?Cells in the body do not respond properly to the insulin that the body makes (insulin resistance). ?Normally, insulin allows blood sugar (glucose) to enter cells in the body. The cells use glucose for energy. Insulin resistance or lack of insulin causes excess glucose to build up in the blood instead of going into cells. This causes high blood glucose (hyperglycemia).  ?What are the causes? ?The exact cause of type 2 diabetes is not known. ?What increases the risk? ?The following factors may make you more likely to develop this condition: ?Having a family member with type 2 diabetes. ?Being overweight or obese. ?Being inactive (sedentary). ?Having been diagnosed with insulin resistance. ?Having a history of prediabetes, diabetes when you were pregnant (gestational diabetes), or polycystic ovary syndrome (PCOS). ?What are the signs or symptoms? ?In the early stage of this condition, you may not have symptoms. Symptoms develop slowly and may include: ?Increased thirst or hunger. ?Increased urination. ?Unexplained weight loss. ?Tiredness (fatigue) or weakness. ?Vision changes, such as blurry vision. ?Dark patches on the skin. ?How is this diagnosed? ?This condition is diagnosed based on your symptoms, your medical history, a physical exam, and your blood glucose level. Your blood glucose may be checked with one or more of the following blood tests: ?A fasting blood glucose (FBG) test. You will not be allowed to eat (you will fast) for 8 hours or longer before a blood sample is taken. ?A random blood glucose test. This test checks blood glucose at any time of day regardless of when you ate. ?An A1C (hemoglobin A1C) blood test. This test provides information about blood  glucose levels over the previous 2-3 months. ?An oral glucose tolerance test (OGTT). This test measures your blood glucose at two times: ?After fasting. This is your baseline blood glucose level. ?Two hours after drinking a beverage that contains glucose. ?You may be diagnosed with type 2 diabetes if: ?Your fasting blood glucose level is 126 mg/dL (7.0 mmol/L) or higher. ?Your random blood glucose level is 200 mg/dL (11.1 mmol/L) or higher. ?Your A1C level is 6.5% or higher. ?Your oral glucose tolerance test result is higher than 200 mg/dL (11.1 mmol/L). ?These blood tests may be repeated to confirm your diagnosis. ?How is this treated? ?Your treatment may be managed by a specialist called an endocrinologist. Type 2 diabetes may be treated by following instructions from your health care provider about: ?Making dietary and lifestyle changes. These may include: ?Following a personalized nutrition plan that is developed by a registered dietitian. ?Exercising regularly. ?Finding ways to manage stress. ?Checking your blood glucose level as often as told. ?Taking diabetes medicines or insulin daily. This helps to keep your blood glucose levels in the healthy range. ?Taking medicines to help prevent complications from diabetes. Medicines may include: ?Aspirin. ?Medicine to lower cholesterol. ?Medicine to control blood pressure. ?Your health care provider will set treatment goals for you. Your goals will be based on your age, other medical conditions you have, and how you respond to diabetes treatment. Generally, the goal of treatment is to maintain the following blood glucose levels: ?Before meals: 80-130 mg/dL (4.4-7.2 mmol/L). ?After meals: below 180 mg/dL (10 mmol/L). ?A1C level: less than 7%. ?Follow these instructions at home: ?Questions to ask your health care provider ?  Consider asking the following questions: ?Should I meet with a certified diabetes care and education specialist? ?What diabetes medicines do I need,  and when should I take them? ?What equipment will I need to manage my diabetes at home? ?How often do I need to check my blood glucose? ?Where can I find a support group for people with diabetes? ?What number can I call if I have questions? ?When is my next appointment? ?General instructions ?Take over-the-counter and prescription medicines only as told by your health care provider. ?Keep all follow-up visits. This is important. ?Where to find more information ?For help and guidance and for more information about diabetes, please visit: ?American Diabetes Association (ADA): www.diabetes.org ?American Association of Diabetes Care and Education Specialists (ADCES): www.diabeteseducator.org ?International Diabetes Federation (IDF): www.idf.org ?Contact a health care provider if: ?Your blood glucose is at or above 240 mg/dL (13.3 mmol/L) for 2 days in a row. ?You have been sick or have had a fever for 2 days or longer, and you are not getting better. ?You have any of the following problems for more than 6 hours: ?You cannot eat or drink. ?You have nausea and vomiting. ?You have diarrhea. ?Get help right away if: ?You have severe hypoglycemia. This means your blood glucose is lower than 54 mg/dL (3.0 mmol/L). ?You become confused or you have trouble thinking clearly. ?You have difficulty breathing. ?You have moderate or large ketone levels in your urine. ?These symptoms may represent a serious problem that is an emergency. Do not wait to see if the symptoms will go away. Get medical help right away. Call your local emergency services (911 in the [place] Do not drive yourself to the hospital. ?Summary ?Type 2 diabetes mellitus is a long-term, or chronic, disease. In type 2 diabetes, the pancreas does not make enough of a hormone called insulin, or cells in the body do not respond properly to insulin that the body makes. ?This condition is treated by making dietary and lifestyle changes and taking diabetes medicines or  insulin. ?Your health care provider will set treatment goals for you. Your goals will be based on your age, other medical conditions you have, and how you respond to diabetes treatment. ?Keep all follow-up visits. This is important. ?This information is not intended to replace advice given to you by your health care provider. Make sure you discuss any questions you have with your health care provider. ?Document Revised: 05/25/2020 Document Reviewed: 05/25/2020 ?Elsevier Patient Education ? 2022 [employer] ? ?

## 2021-01-21 LAB — HEMOGLOBIN A1C: Hgb A1c MFr Bld: 8.2 % — ABNORMAL HIGH (ref 4.6–6.5)

## 2021-01-21 MED ORDER — SHINGRIX 50 MCG/0.5ML IM SUSR: 0.5000 mL | Freq: Once | INTRAMUSCULAR | 1 refills | Status: AC

## 2021-02-12 ENCOUNTER — Ambulatory Visit (INDEPENDENT_AMBULATORY_CARE_PROVIDER_SITE_OTHER): Payer: Medicare Other

## 2021-02-12 ENCOUNTER — Other Ambulatory Visit: Payer: Self-pay

## 2021-02-12 DIAGNOSIS — Z0000 Encounter for general adult medical examination without abnormal findings: Secondary | ICD-10-CM

## 2021-02-12 NOTE — Progress Notes (Signed)
Subjective:   Clifford Brown is a 70 y.o. male who presents for an Subsequent Medicare Annual Wellness Visit.  I connected with Clifford Brown today by telephone and verified that I am speaking with the correct person using two identifiers. Location patient: home Location provider: work Persons participating in the virtual visit: patient, provider.   I discussed the limitations, risks, security and privacy concerns of performing an evaluation and management service by telephone and the availability of in person appointments. I also discussed with the patient that there may be a patient responsible charge related to this service. The patient expressed understanding and verbally consented to this telephonic visit.    Interactive audio and video telecommunications were attempted between this provider and patient, however failed, due to patient having technical difficulties OR patient did not have access to video capability.  We continued and completed visit with audio only.    Review of Systems     Cardiac Risk Factors include: advanced age (>74men, >5 women);diabetes mellitus;dyslipidemia;male gender     Objective:    Today's Vitals   There is no height or weight on file to calculate BMI.  Advanced Directives 02/12/2021 11/27/2019 11/21/2018 10/23/2017 05/02/2016 09/29/2014  Does Patient Have a Medical Advance Directive? No Yes No No Yes No  Type of Advance Directive - Living will - - [place];Living will -  Does patient want to make changes to medical advance directive? - No - Patient declined - - - -  Copy of [place] in Chart? - - - - Yes -  Would patient like information on creating a medical advance directive? No - Patient declined - No - Patient declined Yes (ED - Information included in AVS) - Yes - Educational materials given    Current Medications (verified) Outpatient Encounter Medications as of 02/12/2021  Medication Sig   B-D ULTRAFINE III  SHORT PEN 31G X 8 MM MISC USE DAILY WITH INSULIN   Continuous Blood Gluc Receiver (DEXCOM G6 RECEIVER) DEVI 1 Act by Does not apply route daily.   Continuous Blood Gluc Sensor (DEXCOM G6 SENSOR) MISC 1 Act by Does not apply route daily.   Continuous Blood Gluc Transmit (DEXCOM G6 TRANSMITTER) MISC 1 Act by Does not apply route daily.   empagliflozin (JARDIANCE) 25 MG TABS tablet Take 1 tablet (25 mg total) by mouth daily.   insulin glargine (LANTUS SOLOSTAR) 100 UNIT/ML Solostar Pen Inject 20 Units into the skin daily.   metFORMIN (GLUCOPHAGE-XR) 750 MG 24 hr tablet Take 2 tablets (1,500 mg total) by mouth daily with breakfast.   rosuvastatin (CRESTOR) 10 MG tablet Take 1 tablet (10 mg total) by mouth daily.   Semaglutide (RYBELSUS) 7 MG TABS Take 1 tablet by mouth daily.   telmisartan (MICARDIS) 40 MG tablet Take 1 tablet (40 mg total) by mouth daily.   No facility-administered encounter medications on file as of 02/12/2021.    Allergies (verified) Patient has no known allergies.   History: Past Medical History:  Diagnosis Date   Cancer [facility])    Prostate - Dr Alinda Money    Depression    Diabetes mellitus without complication ([place])    Hyperlipidemia    Hypertension    Past Surgical History:  Procedure Laterality Date   PROSTATE SURGERY     Family History  Problem Relation Age of Onset   Alcohol abuse Other    Cancer Other        Prostate   Stroke Neg Hx  Kidney disease Neg Hx    Hypertension Neg Hx    Hyperlipidemia Neg Hx    Heart disease Neg Hx    Early death Neg Hx    Diabetes Neg Hx    Social History   Socioeconomic History   Marital status: Married    Spouse name: Not on file   Number of children: 2   Years of education: Not on file   Highest education level: Not on file  Occupational History   Occupation: [employer] part-time  Tobacco Use   Smoking status: Never   Smokeless tobacco: Never  Vaping Use   Vaping Use: Never used  Substance and Sexual Activity    Alcohol use: Yes    Alcohol/week: 35.0 standard drinks    Types: 35 Cans of beer per week   Drug use: No   Sexual activity: Yes    Partners: Female  Other Topics Concern   Not on file  Social History Narrative   Not on file   Social Determinants of Health   Financial Resource Strain: Low Risk    Difficulty of Paying Living Expenses: Not hard at all  Food Insecurity: No Food Insecurity   Worried About Charity fundraiser in the Last Year: Never true   [place] in the Last Year: Never true  Transportation Needs: No Transportation Needs   Lack of Transportation (Medical): No   Lack of Transportation (Non-Medical): No  Physical Activity: Sufficiently Active   Days of Exercise per Week: 3 days   Minutes of Exercise per Session: 60 min  Stress: No Stress Concern Present   Feeling of Stress : Not at all  Social Connections: Moderately Integrated   Frequency of Communication with Friends and Family: Twice a week   Frequency of Social Gatherings with Friends and Family: Twice a week   Attends Religious Services: More than 4 times per year   Active Member of [employer] or Organizations: No   Attends Music therapist: Never   Marital Status: Married    Tobacco Counseling Counseling given: Not Answered   Clinical Intake:  Pre-visit preparation completed: Yes  Pain : No/denies pain     Nutritional Risks: None Diabetes: Yes CBG done?: No Did pt. bring in CBG monitor from home?: No  How often do you need to have someone help you when you read instructions, pamphlets, or other written materials from your doctor or pharmacy?: 1 - Never What is the last grade level you completed in school?: college  Diabetic?yes Nutrition Risk Assessment:  Has the patient had any N/V/D within the last 2 months?  No  Does the patient have any non-healing wounds?  No  Has the patient had any unintentional weight loss or weight gain?  No   Diabetes:  Is the patient diabetic?   Yes  If diabetic, was a CBG obtained today?  No  Did the patient bring in their glucometer from home?  No  How often do you monitor your CBG's? Dexcom .   Financial Strains and Diabetes Management:  Are you having any financial strains with the device, your supplies or your medication? No .  Does the patient want to be seen by Chronic Care Management for management of their diabetes?  No  Would the patient like to be referred to a Nutritionist or for Diabetic Management?  No   Diabetic Exams:  Diabetic Eye Exam: Completed 05/2020 Diabetic Foot Exam: Overdue, Pt has been advised about the importance in  completing this exam. Pt is scheduled for diabetic foot exam on next office visit .   Interpreter Needed?: No  Information entered by :: [place] of Daily Living In your present state of health, do you have any difficulty performing the following activities: 02/12/2021 01/20/2021  Hearing? N N  Vision? N N  Difficulty concentrating or making decisions? N N  Walking or climbing stairs? N N  Dressing or bathing? N N  Doing errands, shopping? N N  Preparing Food and eating ? N -  Using the Toilet? N -  In the past six months, have you accidently leaked urine? N -  Do you have problems with loss of bowel control? N -  Managing your Medications? N -  Managing your Finances? N -  Housekeeping or managing your Housekeeping? N -  Some recent data might be hidden    Patient Care Team: [name], MD as PCP - General Lenord Fellers Cleaster [place], [facility] as Pharmacist (Pharmacist)  Indicate any recent Medical Services you may have received from other than [facility] providers in the past year (date may be approximate).     Assessment:   This is a routine wellness examination for Clifford Brown.  Hearing/Vision screen Vision Screening - Comments:: Annual eye exams wears contacts   Dietary issues and exercise activities discussed: Current Exercise Habits: Home exercise routine, Type of  exercise: walking, Time (Minutes): 50, Frequency (Times/Week): 3, Weekly Exercise (Minutes/Week): 150, Intensity: Mild, Exercise limited by: None identified   Goals Addressed   None    Depression Screen PHQ 2/9 Scores 02/12/2021 02/12/2021 01/20/2021 11/27/2019 11/21/2018 04/10/2018 10/23/2017  PHQ - 2 Score 0 0 0 0 1 0 0  PHQ- 9 Score - - - - 3 0 1    Fall Risk Fall Risk  02/12/2021 01/20/2021 11/27/2019 11/21/2018 05/18/2018  Falls in the past year? 0 0 0 0 0  Comment - - - - -  Number falls in past yr: 0 - 0 0 -  Injury with Fall? 0 - 0 0 -  Risk for fall due to : - - No Fall Risks - -  Follow up Falls evaluation completed - Falls evaluation completed - -    FALL RISK PREVENTION PERTAINING TO THE HOME:  Any stairs in or around the home? Yes  If so, are there any without handrails? No  Home free of loose throw rugs in walkways, pet beds, electrical cords, etc? Yes  Adequate lighting in your home to reduce risk of falls? Yes   ASSISTIVE DEVICES UTILIZED TO PREVENT FALLS:  Life alert? No  Use of a cane, walker or w/c? No  Grab bars in the bathroom? No  Shower chair or bench in shower? No  Elevated toilet seat or a handicapped toilet? No    Cognitive Function:  Normal cognitive status assessed by direct observation by this Nurse Health Advisor. No abnormalities found.     6CIT Screen 11/27/2019  What Year? 0 points  What month? 0 points  What time? 0 points  Count back from 20 0 points  Months in reverse 0 points  Repeat phrase 0 points  Total Score 0    Immunizations Immunization History  Administered Date(s) Administered   Fluad Quad(high Dose 65+) 11/21/2018   Influenza Whole 12/28/2011, 01/03/2013   Influenza, High Dose Seasonal PF 02/08/2017, 12/04/2017   Influenza,inj,Quad PF,6+ Mos 11/25/2014   Influenza-Unspecified 11/13/2019, 12/08/2020   Moderna SARS-COV2 Booster Vaccination 12/08/2020   PFIZER(Purple Top)SARS-COV-2 Vaccination 05/10/2019,  06/04/2019, 01/21/2020    Pneumococcal Conjugate-13 08/21/2014   Pneumococcal Polysaccharide-23 01/08/2013, 05/16/2018   Td 01/12/2010   Tdap 05/13/2020   Zoster, Live 08/21/2014    TDAP status: Up to date  Flu Vaccine status: Up to date  Pneumococcal vaccine status: Up to date  Covid-19 vaccine status: Completed vaccines  Qualifies for Shingles Vaccine? Yes   Zostavax completed No   Shingrix Completed?: No.    Education has been provided regarding the importance of this vaccine. Patient has been advised to call insurance company to determine out of pocket expense if they have not yet received this vaccine. Advised may also receive vaccine at local pharmacy or Health Dept. Verbalized acceptance and understanding.  Screening Tests Health Maintenance  Topic Date Due   Zoster Vaccines- Shingrix (1 of 2) Never done   COVID-19 Vaccine (4 - Booster for Pfizer series) 02/02/2021   Fecal DNA (Cologuard)  04/23/2021   OPHTHALMOLOGY EXAM  07/07/2021   HEMOGLOBIN A1C  07/20/2021   FOOT EXAM  01/20/2022   TETANUS/TDAP  05/14/2030   Pneumonia Vaccine 60+ Years old  Completed   INFLUENZA VACCINE  Completed   Hepatitis C Screening  Completed   HPV VACCINES  Aged Out    Health Maintenance  Health Maintenance Due  Topic Date Due   Zoster Vaccines- Shingrix (1 of 2) Never done   COVID-19 Vaccine (4 - Booster for Pfizer series) 02/02/2021    Colorectal cancer screening: Type of screening: Cologuard. Completed 04/23/2018. Repeat every 3 years  Lung Cancer Screening: (Low Dose CT Chest recommended if Age 39-80 years, 30 pack-year currently smoking OR have quit w/in 15years.) does not qualify.   Lung Cancer Screening Referral: n/a  Additional Screening:  Hepatitis C Screening: does not qualify; Completed 08/21/2014  Vision Screening: Recommended annual ophthalmology exams for early detection of glaucoma and other disorders of the eye. Is the patient up to date with their annual eye exam?  Yes  Who is the  provider or what is the name of the office in which the patient attends annual eye exams? [facility]  If pt is not established with a provider, would they like to be referred to a provider to establish care? No .   Dental Screening: Recommended annual dental exams for proper oral hygiene  Community Resource Referral / Chronic Care Management: CRR required this visit?  No   CCM required this visit?  No      Plan:     I have personally reviewed and noted the following in the patient's chart:   Medical and social history Use of alcohol, tobacco or illicit drugs  Current medications and supplements including opioid prescriptions. Patient is not currently taking opioid prescriptions. Functional ability and status Nutritional status Physical activity Advanced directives List of other physicians Hospitalizations, surgeries, and ER visits in previous 12 months Vitals Screenings to include cognitive, depression, and falls Referrals and appointments  In addition, I have reviewed and discussed with patient certain preventive protocols, quality metrics, and best practice recommendations. A written personalized care plan for preventive services as well as general preventive health recommendations were provided to patient.     Randel Pigg, LPN   [id number]   Nurse Notes: none

## 2021-02-12 NOTE — Patient Instructions (Signed)
[name] , Thank you for taking time to come for your Medicare Wellness Visit. I appreciate your ongoing commitment to your health goals. Please review the following plan we discussed and let me know if I can assist you in the future.   Screening recommendations/referrals: Colonoscopy: Cologuard due 04/2021 Recommended yearly ophthalmology/optometry visit for glaucoma screening and checkup Recommended yearly dental visit for hygiene and checkup  Vaccinations: Influenza vaccine: completed  Pneumococcal vaccine: completed  Tdap vaccine: 05/13/2020 Shingles vaccine: will consider     Advanced directives: none   Conditions/risks identified: none   Next appointment: none   Preventive Care 44 Years and Older, Male Preventive care refers to lifestyle choices and visits with your health care provider that can promote health and wellness. What does preventive care include? A yearly physical exam. This is also called an annual well check. Dental exams once or twice a year. Routine eye exams. Ask your health care provider how often you should have your eyes checked. Personal lifestyle choices, including: Daily care of your teeth and gums. Regular physical activity. Eating a healthy diet. Avoiding tobacco and drug use. Limiting alcohol use. Practicing safe sex. Taking low doses of aspirin every day. Taking vitamin and mineral supplements as recommended by your health care provider. What happens during an annual well check? The services and screenings done by your health care provider during your annual well check will depend on your age, overall health, lifestyle risk factors, and family history of disease. Counseling  Your health care provider may ask you questions about your: Alcohol use. Tobacco use. Drug use. Emotional well-being. Home and relationship well-being. Sexual activity. Eating habits. History of falls. Memory and ability to understand (cognition). Work and work  Statistician. Screening  You may have the following tests or measurements: Height, weight, and BMI. Blood pressure. Lipid and cholesterol levels. These may be checked every 5 years, or more frequently if you are over 48 years old. Skin check. Lung cancer screening. You may have this screening every year starting at age 63 if you have a 30-pack-year history of smoking and currently smoke or have quit within the past 15 years. Fecal occult blood test (FOBT) of the stool. You may have this test every year starting at age 38. Flexible sigmoidoscopy or colonoscopy. You may have a sigmoidoscopy every 5 years or a colonoscopy every 10 years starting at age 4. Prostate cancer screening. Recommendations will vary depending on your family history and other risks. Hepatitis C blood test. Hepatitis B blood test. Sexually transmitted disease (STD) testing. Diabetes screening. This is done by checking your blood sugar (glucose) after you have not eaten for a while (fasting). You may have this done every 1-3 years. Abdominal aortic aneurysm (AAA) screening. You may need this if you are a current or former smoker. Osteoporosis. You may be screened starting at age 37 if you are at high risk. Talk with your health care provider about your test results, treatment options, and if necessary, the need for more tests. Vaccines  Your health care provider may recommend certain vaccines, such as: Influenza vaccine. This is recommended every year. Tetanus, diphtheria, and acellular pertussis (Tdap, Td) vaccine. You may need a Td booster every 10 years. Zoster vaccine. You may need this after age 101. Pneumococcal 13-valent conjugate (PCV13) vaccine. One dose is recommended after age 63. Pneumococcal polysaccharide (PPSV23) vaccine. One dose is recommended after age 8. Talk to your health care provider about which screenings and vaccines you need and how  often you need them. This information is not intended to replace  advice given to you by your health care provider. Make sure you discuss any questions you have with your health care provider. Document Released: 03/27/2015 Document Revised: 11/18/2015 Document Reviewed: 12/30/2014 Elsevier Interactive Patient Education  2017 [place] Prevention in the Home Falls can cause injuries. They can happen to people of all ages. There are many things you can do to make your home safe and to help prevent falls. What can I do on the outside of my home? Regularly fix the edges of walkways and driveways and fix any cracks. Remove anything that might make you trip as you walk through a door, such as a raised step or threshold. Trim any bushes or trees on the path to your home. Use bright outdoor lighting. Clear any walking paths of anything that might make someone trip, such as rocks or tools. Regularly check to see if handrails are loose or broken. Make sure that both sides of any steps have handrails. Any raised decks and porches should have guardrails on the edges. Have any leaves, snow, or ice cleared regularly. Use sand or salt on walking paths during winter. Clean up any spills in your garage right away. This includes oil or grease spills. What can I do in the bathroom? Use night lights. Install grab bars by the toilet and in the tub and shower. Do not use towel bars as grab bars. Use non-skid mats or decals in the tub or shower. If you need to sit down in the shower, use a plastic, non-slip stool. Keep the floor dry. Clean up any water that spills on the floor as soon as it happens. Remove soap buildup in the tub or shower regularly. Attach bath mats securely with double-sided non-slip rug tape. Do not have throw rugs and other things on the floor that can make you trip. What can I do in the bedroom? Use night lights. Make sure that you have a light by your bed that is easy to reach. Do not use any sheets or blankets that are too big for your bed.  They should not hang down onto the floor. Have a firm chair that has side arms. You can use this for support while you get dressed. Do not have throw rugs and other things on the floor that can make you trip. What can I do in the kitchen? Clean up any spills right away. Avoid walking on wet floors. Keep items that you use a lot in easy-to-reach places. If you need to reach something above you, use a strong step stool that has a grab bar. Keep electrical cords out of the way. Do not use floor polish or wax that makes floors slippery. If you must use wax, use non-skid floor wax. Do not have throw rugs and other things on the floor that can make you trip. What can I do with my stairs? Do not leave any items on the stairs. Make sure that there are handrails on both sides of the stairs and use them. Fix handrails that are broken or loose. Make sure that handrails are as long as the stairways. Check any carpeting to make sure that it is firmly attached to the stairs. Fix any carpet that is loose or worn. Avoid having throw rugs at the top or bottom of the stairs. If you do have throw rugs, attach them to the floor with carpet tape. Make sure that you have a  light switch at the top of the stairs and the bottom of the stairs. If you do not have them, ask someone to add them for you. What else can I do to help prevent falls? Wear shoes that: Do not have high heels. Have rubber bottoms. Are comfortable and fit you well. Are closed at the toe. Do not wear sandals. If you use a stepladder: Make sure that it is fully opened. Do not climb a closed stepladder. Make sure that both sides of the stepladder are locked into place. Ask someone to hold it for you, if possible. Clearly mark and make sure that you can see: Any grab bars or handrails. First and last steps. Where the edge of each step is. Use tools that help you move around (mobility aids) if they are needed. These  include: Canes. Walkers. Scooters. Crutches. Turn on the lights when you go into a dark area. Replace any light bulbs as soon as they burn out. Set up your furniture so you have a clear path. Avoid moving your furniture around. If any of your floors are uneven, fix them. If there are any pets around you, be aware of where they are. Review your medicines with your doctor. Some medicines can make you feel dizzy. This can increase your chance of falling. Ask your doctor what other things that you can do to help prevent falls. This information is not intended to replace advice given to you by your health care provider. Make sure you discuss any questions you have with your health care provider. Document Released: 12/25/2008 Document Revised: 08/06/2015 Document Reviewed: 04/04/2014 Elsevier Interactive Patient Education  2017 [employer].

## 2021-02-17 ENCOUNTER — Ambulatory Visit: Payer: Medicare Other | Admitting: Internal Medicine

## 2021-03-20 ENCOUNTER — Other Ambulatory Visit: Payer: Self-pay | Admitting: Internal Medicine

## 2021-03-20 DIAGNOSIS — E118 Type 2 diabetes mellitus with unspecified complications: Secondary | ICD-10-CM

## 2021-04-19 ENCOUNTER — Telehealth: Payer: Self-pay

## 2021-04-19 NOTE — Chronic Care Management (AMB) (Signed)
° ° °  Chronic Care Management Pharmacy Assistant   Name: Clifford Brown  MRN: [id number] DOB: [birth year]  Reason for Encounter: Disease State-General    Recent office visits:  01/20/21 Clifford Lima, MD-PCP (Essential hypertension, benign) Labs ordered, Med Changes: Zoster Vac Recomb adjuvanted 50 mcg  Recent consult visits:  None ID  Hospital visits:  None in previous 6 months  Medications: Outpatient Encounter Medications as of 04/19/2021  Medication Sig   B-D ULTRAFINE III SHORT PEN 31G X 8 MM MISC USE DAILY WITH INSULIN   Continuous Blood Gluc Receiver (DEXCOM G6 RECEIVER) DEVI 1 Act by Does not apply route daily.   Continuous Blood Gluc Sensor (DEXCOM G6 SENSOR) MISC 1 Act by Does not apply route daily.   Continuous Blood Gluc Transmit (DEXCOM G6 TRANSMITTER) MISC 1 Act by Does not apply route daily.   empagliflozin (JARDIANCE) 25 MG TABS tablet Take 1 tablet (25 mg total) by mouth daily.   insulin glargine (LANTUS SOLOSTAR) 100 UNIT/ML Solostar Pen Inject 20 Units into the skin daily.   metFORMIN (GLUCOPHAGE-XR) 750 MG 24 hr tablet Take 2 tablets (1,500 mg total) by mouth daily with breakfast.   rosuvastatin (CRESTOR) 10 MG tablet Take 1 tablet (10 mg total) by mouth daily.   Semaglutide (RYBELSUS) 7 MG TABS Take 1 tablet by mouth daily.   telmisartan (MICARDIS) 40 MG tablet Take 1 tablet (40 mg total) by mouth daily.   No facility-administered encounter medications on file as of 04/19/2021.   Have you had any problems recently with your health?Patient states that he does not have any new health issues   Have you had any problems with your pharmacy?Patient stated that he does not have any problems with getting medication or the cost of medications from the pharmacy  What issues or side effects are you having with your medications?Patient is not having any side effects but states he has notice his blood sugar levels has increased between 285-285 now that he is taking rybelsus 14  mg daily  What would you like me to pass along to Clifford Brown for them to help you with? Patient states that Dr. Ronnald Brown increased his Rybelsus to 14 mg daily but he notice since he has increased it his blood sugar goes up. He states that his blood sugar levels have been pretty high about 280 and wants to know is that normal since he increased the rybelsus.  What can we do to take care of you better? Patient states that he does not need anything at this time  Care Gaps: Colonoscopy-NA  Diabetic Foot Exam-01/20/21 Ophthalmology-07/07/20 Dexa Scan - NA Annual Well Visit - NA Micro albumin-NA Hemoglobin A1c- 01/20/21  Star Rating Drugs: Metformin 750 mg-last fill 01/20/21 90 ds Rosuvastatin 10 mg-last fill 02/07/21 90 ds rybelsus 7 mg-last fill 01/07/21 90 ds telmisartan 40 mg-last fill 02/07/21 90 ds  Clifford Brown Clinical Pharmacist Assistant [phone]

## 2021-04-20 NOTE — Telephone Encounter (Signed)
Spoke with patient, reports that BG has since started to reduce over the past few days  109 this AM, 150's throughout the rest of the day   Notes that when blood sugars had been elevated in the 200-250's that he had a sinus infection which has since started to improve, patient feeling better.  While patient was sick, notes that he was drinking more fluids, was drinking more water, milk, and beer.  Discussed that BG likely elevated due to sickness and increased intake of carb containing fluids - recommended to decrease intake of milk and beer.  Recommended for patient to continue on rybelsus 7mg  daily (current rx is for 7mg  daily dose), patient to monitor blood sugars with dexcom and reach out to clinic should average be >150 to discuss medication changes

## 2021-05-27 ENCOUNTER — Encounter: Payer: Self-pay | Admitting: Internal Medicine

## 2021-05-27 ENCOUNTER — Ambulatory Visit: Payer: Medicare Other | Admitting: Internal Medicine

## 2021-05-27 ENCOUNTER — Other Ambulatory Visit: Payer: Self-pay

## 2021-05-27 VITALS — BP 132/82 | HR 97 | Temp 98.2°F | Ht 70.0 in | Wt 173.0 lb

## 2021-05-27 DIAGNOSIS — I10 Essential (primary) hypertension: Secondary | ICD-10-CM | POA: Diagnosis not present

## 2021-05-27 DIAGNOSIS — E039 Hypothyroidism, unspecified: Secondary | ICD-10-CM | POA: Diagnosis not present

## 2021-05-27 DIAGNOSIS — E118 Type 2 diabetes mellitus with unspecified complications: Secondary | ICD-10-CM | POA: Diagnosis not present

## 2021-05-27 LAB — BASIC METABOLIC PANEL
BUN: 23 mg/dL (ref 6–23)
CO2: 26 mEq/L (ref 19–32)
Calcium: 9.5 mg/dL (ref 8.4–10.5)
Chloride: 103 mEq/L (ref 96–112)
Creatinine, Ser: 1.15 mg/dL (ref 0.40–1.50)
GFR: 64.42 mL/min (ref >60.00)
Glucose, Bld: 187 mg/dL — ABNORMAL HIGH (ref 70–99)
Potassium: 4.3 mEq/L (ref 3.5–5.1)
Sodium: 138 mEq/L (ref 135–145)

## 2021-05-27 LAB — TSH: TSH: 4.38 u[IU]/mL (ref 0.35–5.50)

## 2021-05-27 LAB — HEMOGLOBIN A1C: Hgb A1c MFr Bld: 8.1 % — ABNORMAL HIGH (ref 4.6–6.5)

## 2021-05-27 MED ORDER — RYBELSUS 14 MG PO TABS: 14.0000 mg | ORAL_TABLET | Freq: Every day | ORAL | 1 refills | Status: DC

## 2021-05-27 NOTE — Patient Instructions (Signed)
Type 2 Diabetes Mellitus, Diagnosis, Adult ?Type 2 diabetes (type 2 diabetes mellitus) is a long-term, or chronic, disease. In type 2 diabetes, one or both of these problems may be present: ?The pancreas does not make enough of a hormone called insulin. ?Cells in the body do not respond properly to the insulin that the body makes (insulin resistance). ?Normally, insulin allows blood sugar (glucose) to enter cells in the body. The cells use glucose for energy. Insulin resistance or lack of insulin causes excess glucose to build up in the blood instead of going into cells. This causes high blood glucose (hyperglycemia).  ?What are the causes? ?The exact cause of type 2 diabetes is not known. ?What increases the risk? ?The following factors may make you more likely to develop this condition: ?Having a family member with type 2 diabetes. ?Being overweight or obese. ?Being inactive (sedentary). ?Having been diagnosed with insulin resistance. ?Having a history of prediabetes, diabetes when you were pregnant (gestational diabetes), or polycystic ovary syndrome (PCOS). ?What are the signs or symptoms? ?In the early stage of this condition, you may not have symptoms. Symptoms develop slowly and may include: ?Increased thirst or hunger. ?Increased urination. ?Unexplained weight loss. ?Tiredness (fatigue) or weakness. ?Vision changes, such as blurry vision. ?Dark patches on the skin. ?How is this diagnosed? ?This condition is diagnosed based on your symptoms, your medical history, a physical exam, and your blood glucose level. Your blood glucose may be checked with one or more of the following blood tests: ?A fasting blood glucose (FBG) test. You will not be allowed to eat (you will fast) for 8 hours or longer before a blood sample is taken. ?A random blood glucose test. This test checks blood glucose at any time of day regardless of when you ate. ?An A1C (hemoglobin A1C) blood test. This test provides information about blood  glucose levels over the previous 2-3 months. ?An oral glucose tolerance test (OGTT). This test measures your blood glucose at two times: ?After fasting. This is your baseline blood glucose level. ?Two hours after drinking a beverage that contains glucose. ?You may be diagnosed with type 2 diabetes if: ?Your fasting blood glucose level is 126 mg/dL (7.0 mmol/L) or higher. ?Your random blood glucose level is 200 mg/dL (11.1 mmol/L) or higher. ?Your A1C level is 6.5% or higher. ?Your oral glucose tolerance test result is higher than 200 mg/dL (11.1 mmol/L). ?These blood tests may be repeated to confirm your diagnosis. ?How is this treated? ?Your treatment may be managed by a specialist called an endocrinologist. Type 2 diabetes may be treated by following instructions from your health care provider about: ?Making dietary and lifestyle changes. These may include: ?Following a personalized nutrition plan that is developed by a registered dietitian. ?Exercising regularly. ?Finding ways to manage stress. ?Checking your blood glucose level as often as told. ?Taking diabetes medicines or insulin daily. This helps to keep your blood glucose levels in the healthy range. ?Taking medicines to help prevent complications from diabetes. Medicines may include: ?Aspirin. ?Medicine to lower cholesterol. ?Medicine to control blood pressure. ?Your health care provider will set treatment goals for you. Your goals will be based on your age, other medical conditions you have, and how you respond to diabetes treatment. Generally, the goal of treatment is to maintain the following blood glucose levels: ?Before meals: 80-130 mg/dL (4.4-7.2 mmol/L). ?After meals: below 180 mg/dL (10 mmol/L). ?A1C level: less than 7%. ?Follow these instructions at home: ?Questions to ask your health care provider ?  Consider asking the following questions: ?Should I meet with a certified diabetes care and education specialist? ?What diabetes medicines do I need,  and when should I take them? ?What equipment will I need to manage my diabetes at home? ?How often do I need to check my blood glucose? ?Where can I find a support group for people with diabetes? ?What number can I call if I have questions? ?When is my next appointment? ?General instructions ?Take over-the-counter and prescription medicines only as told by your health care provider. ?Keep all follow-up visits. This is important. ?Where to find more information ?For help and guidance and for more information about diabetes, please visit: ?American Diabetes Association (ADA): www.diabetes.org ?American Association of Diabetes Care and Education Specialists (ADCES): www.diabeteseducator.org ?International Diabetes Federation (IDF): www.idf.org ?Contact a health care provider if: ?Your blood glucose is at or above 240 mg/dL (13.3 mmol/L) for 2 days in a row. ?You have been sick or have had a fever for 2 days or longer, and you are not getting better. ?You have any of the following problems for more than 6 hours: ?You cannot eat or drink. ?You have nausea and vomiting. ?You have diarrhea. ?Get help right away if: ?You have severe hypoglycemia. This means your blood glucose is lower than 54 mg/dL (3.0 mmol/L). ?You become confused or you have trouble thinking clearly. ?You have difficulty breathing. ?You have moderate or large ketone levels in your urine. ?These symptoms may represent a serious problem that is an emergency. Do not wait to see if the symptoms will go away. Get medical help right away. Call your local emergency services (911 in the [place] Do not drive yourself to the hospital. ?Summary ?Type 2 diabetes mellitus is a long-term, or chronic, disease. In type 2 diabetes, the pancreas does not make enough of a hormone called insulin, or cells in the body do not respond properly to insulin that the body makes. ?This condition is treated by making dietary and lifestyle changes and taking diabetes medicines or  insulin. ?Your health care provider will set treatment goals for you. Your goals will be based on your age, other medical conditions you have, and how you respond to diabetes treatment. ?Keep all follow-up visits. This is important. ?This information is not intended to replace advice given to you by your health care provider. Make sure you discuss any questions you have with your health care provider. ?Document Revised: 05/25/2020 Document Reviewed: 05/25/2020 ?Elsevier Patient Education ? 2022 [employer] ? ?

## 2021-05-27 NOTE — Progress Notes (Signed)
? ?Subjective:  ?Patient ID: Clifford Brown, male    DOB: 1950/10/20  Age: 71 y.o. MRN: [id number] ? ?CC: Hypertension and Diabetes ? ?This visit occurred during the SARS-CoV-2 public health emergency.  Safety protocols were in place, including screening questions prior to the visit, additional usage of staff PPE, and extensive cleaning of exam room while observing appropriate contact time as indicated for disinfecting solutions.   ? ?HPI ?Clifford Brown presents for f/up - ? ?He complains of postprandial spikes as high as 300.  He denies polys.  He is active and denies chest pain, shortness of breath, diaphoresis, edema, or fatigue. ? ?Outpatient Medications Prior to Visit  ?Medication Sig Dispense Refill  ? B-D ULTRAFINE III SHORT PEN 31G X 8 MM MISC USE DAILY WITH INSULIN 100 each 1  ? Continuous Blood Gluc Receiver (DEXCOM G6 RECEIVER) DEVI 1 Act by Does not apply route daily. 1 each 5  ? Continuous Blood Gluc Sensor (DEXCOM G6 SENSOR) MISC 1 Act by Does not apply route daily. 1 each 5  ? Continuous Blood Gluc Transmit (DEXCOM G6 TRANSMITTER) MISC 1 Act by Does not apply route daily. 1 each 5  ? empagliflozin (JARDIANCE) 25 MG TABS tablet Take 1 tablet (25 mg total) by mouth daily. 90 tablet 1  ? insulin glargine (LANTUS SOLOSTAR) 100 UNIT/ML Solostar Pen Inject 20 Units into the skin daily. 45 mL 1  ? metFORMIN (GLUCOPHAGE-XR) 750 MG 24 hr tablet Take 2 tablets (1,500 mg total) by mouth daily with breakfast. 180 tablet 1  ? rosuvastatin (CRESTOR) 10 MG tablet Take 1 tablet (10 mg total) by mouth daily. 90 tablet 1  ? telmisartan (MICARDIS) 40 MG tablet Take 1 tablet (40 mg total) by mouth daily. 90 tablet 1  ? Semaglutide (RYBELSUS) 7 MG TABS Take 1 tablet by mouth daily. 90 tablet 1  ? ?No facility-administered medications prior to visit.  ? ? ?ROS ?Review of Systems  ?Constitutional:  Negative for diaphoresis and fatigue.  ?HENT: Negative.    ?Respiratory:  Negative for cough, chest tightness, shortness of  breath and wheezing.   ?Cardiovascular:  Negative for chest pain, palpitations and leg swelling.  ?Gastrointestinal:  Negative for abdominal pain, diarrhea and nausea.  ?Endocrine: Negative.  Negative for polydipsia, polyphagia and polyuria.  ?Genitourinary: Negative.  Negative for difficulty urinating.  ?Musculoskeletal: Negative.  Negative for myalgias.  ?Skin: Negative.   ?Neurological: Negative.  Negative for dizziness, weakness and light-headedness.  ?Hematological:  Negative for adenopathy. Does not bruise/bleed easily.  ?Psychiatric/Behavioral: Negative.    ? ?Objective:  ?BP 132/82 (BP Location: Left Arm, Patient Position: Sitting, Cuff Size: Large)   Pulse 97   Temp 98.2 ?F (36.8 ?C) (Oral)   Ht '5\' 10"'$  (1.778 m)   Wt 173 lb (78.5 kg)   SpO2 98%   BMI 24.82 kg/m?  ? ?BP Readings from Last 3 Encounters:  ?05/27/21 132/82  ?01/20/21 126/78  ?08/26/20 130/78  ? ? ?Wt Readings from Last 3 Encounters:  ?05/27/21 173 lb (78.5 kg)  ?01/20/21 176 lb (79.8 kg)  ?08/26/20 176 lb (79.8 kg)  ? ? ?Physical Exam ?Vitals reviewed.  ?Constitutional:   ?   Appearance: He is not ill-appearing.  ?HENT:  ?   Mouth/Throat:  ?   Mouth: Mucous membranes are moist.  ?Eyes:  ?   General: No scleral icterus. ?   Conjunctiva/sclera: Conjunctivae normal.  ?Cardiovascular:  ?   Rate and Rhythm: Normal rate and regular rhythm.  ?   Heart  sounds: No murmur heard. ?Pulmonary:  ?   Effort: Pulmonary effort is normal.  ?   Breath sounds: No stridor. No wheezing, rhonchi or rales.  ?Abdominal:  ?   General: Abdomen is flat.  ?   Palpations: There is no mass.  ?   Tenderness: There is no abdominal tenderness. There is no guarding.  ?   Hernia: No hernia is present.  ?Musculoskeletal:     ?   General: Normal range of motion.  ?   Cervical back: Neck supple.  ?   Right lower leg: No edema.  ?   Left lower leg: No edema.  ?Lymphadenopathy:  ?   Cervical: No cervical adenopathy.  ?Skin: ?   General: Skin is warm and dry.  ?Neurological:  ?    General: No focal deficit present.  ?   Mental Status: He is alert.  ?Psychiatric:     ?   Mood and Affect: Mood normal.     ?   Behavior: Behavior normal.  ? ? ?Lab Results  ?Component Value Date  ? WBC 5.2 05/12/2020  ? HGB 13.9 05/12/2020  ? HCT 41.7 05/12/2020  ? PLT 195.0 05/12/2020  ? GLUCOSE 187 (H) 05/27/2021  ? CHOL 141 05/12/2020  ? TRIG 74.0 05/12/2020  ? HDL 67.70 05/12/2020  ? LDLDIRECT 183.6 01/31/2011  ? Clifford Brown 58 05/12/2020  ? ALT 20 05/12/2020  ? AST 16 05/12/2020  ? NA 138 05/27/2021  ? K 4.3 05/27/2021  ? CL 103 05/27/2021  ? CREATININE 1.15 05/27/2021  ? BUN 23 05/27/2021  ? CO2 26 05/27/2021  ? TSH 4.38 05/27/2021  ? PSA 0.00 (L) 05/12/2020  ? HGBA1C 8.1 (H) 05/27/2021  ? MICROALBUR <0.7 05/12/2020  ? ? ?DG Chest 2 View ? ?Result Date: 04/10/2018 ?CLINICAL DATA:  Cough EXAM: CHEST - 2 VIEW COMPARISON:  12/03/2008 FINDINGS: Heart and mediastinal contours are within normal limits. No focal opacities or effusions. No acute bony abnormality. IMPRESSION: No active cardiopulmonary disease. Electronically Signed   By: Clifford Brown M.D.   On: 04/10/2018 17:15  ? ? ?Assessment & Plan:  ? ?Clifford Brown was seen today for hypertension and diabetes. ? ?Diagnoses and all orders for this visit: ? ?Essential hypertension, benign- His blood pressure is adequately well controlled. ?-     Basic metabolic panel; Future ?-     Basic metabolic panel ? ?Acquired hypothyroidism- His TSH is normal and he is euthyroid. ?-     TSH; Future ?-     TSH ? ?Type II diabetes mellitus with manifestations (Clifford Brown)- His A1c remains too high.  Will increase the dose of the GLP-1 agonist. ?-     Basic metabolic panel; Future ?-     Hemoglobin A1c; Future ?-     Hemoglobin A1c ?-     Basic metabolic panel ?-     Semaglutide (RYBELSUS) 14 MG TABS; Take 1 tablet (14 mg total) by mouth daily. ? ?Screen for colon cancer ?-     Cologuard ? ? ?I have discontinued Clifford Brown "Clifford Brown"'s Rybelsus. I am also having him start on Rybelsus.  Additionally, I am having him maintain his Dexcom G6 Receiver, Dexcom G6 Transmitter, Lantus SoloStar, empagliflozin, metFORMIN, rosuvastatin, telmisartan, Dexcom G6 Sensor, and B-D ULTRAFINE III SHORT PEN. ? ?Meds ordered this encounter  ?Medications  ? Semaglutide (RYBELSUS) 14 MG TABS  ?  Sig: Take 1 tablet (14 mg total) by mouth daily.  ?  Dispense:  90 tablet  ?  Refill:  1  ? ? ? ?Follow-up: Return in about 6 months (around 11/27/2021). ? ?[name], MD ?

## 2021-06-11 LAB — COLOGUARD: COLOGUARD: NEGATIVE

## 2021-06-22 ENCOUNTER — Telehealth: Payer: Medicare Other

## 2021-06-22 NOTE — Progress Notes (Deleted)
? ?Chronic Care Management ?Pharmacy Note ? ?06/22/2021 ?Name:  Clifford Brown MRN:  [id number] DOB:  1950-04-30 ? ?Summary: ?-Pt is not currently taking a GLP-1 agonist - he never picked up Trulicity and stopped Rybelsus 3 mg after a couple weeks due to confusion over what to take; he reports Rybelsus copay was reasonable and he still has 1-2 weeks of Rybelsus 3 mg ?-Pt reports fasting BG 96-120; post-prandial BG up to 260s ? ?Recommendations/Changes made from today's visit: ?-Restart Rybelsus 3 mg. Once completed, increase to 7 mg ?-May reduce Lantus by 2 units if fasting BG < 90 ? ? ?Subjective: ?Clifford Brown is an 71 y.o. year old male who is a primary patient of [name], MD.  The CCM team was consulted for assistance with disease management and care coordination needs.   ? ?Engaged with patient by telephone for follow up visit in response to provider referral for pharmacy case management and/or care coordination services.  ? ?Consent to Services:  ?The patient was given information about Chronic Care Management services, agreed to services, and gave verbal consent prior to initiation of services.  Please see initial visit note for detailed documentation.  ? ?Patient Care Team: ?[name], MD as PCP - General ?Charlton Haws, [facility] as Pharmacist (Pharmacist) ? ?Recent office visits: ?05/27/2021 - Dr. [name] - increase rybelsus to 42m daily - f/u in 6 months  ?01/20/2021 - Dr. JRonnald Ramp- no changes to medications - stressed importance of compliance  ? ?Recent consult visits: ?n/a ? ?Hospital visits: ?None in previous 6 months ? ?Objective: ? ?Lab Results  ?Component Value Date  ? CREATININE 1.15 05/27/2021  ? BUN 23 05/27/2021  ? GFR 64.42 05/27/2021  ? GFRNONAA 70 12/12/2019  ? GFRAA 81 12/12/2019  ? NA 138 05/27/2021  ? K 4.3 05/27/2021  ? CALCIUM 9.5 05/27/2021  ? CO2 26 05/27/2021  ? GLUCOSE 187 (H) 05/27/2021  ? ? ?Lab Results  ?Component Value Date/Time  ? HGBA1C 8.1 (H) 05/27/2021 01:45 PM  ?  HGBA1C 8.2 (H) 01/20/2021 04:08 PM  ? GFR 64.42 05/27/2021 01:45 PM  ? GFR 72.03 01/20/2021 04:08 PM  ? MICROALBUR <0.7 05/12/2020 08:43 AM  ? MICROALBUR 2.2 (H) 05/30/2019 10:18 AM  ?  ?Last diabetic Eye exam:  ?Lab Results  ?Component Value Date/Time  ? HMDIABEYEEXA No Retinopathy 07/07/2020 12:00 AM  ?  ?Last diabetic Foot exam:  ?Lab Results  ?Component Value Date/Time  ? HMDIABFOOTEX done 09/20/2017 12:00 AM  ?  ? ?Lab Results  ?Component Value Date  ? CHOL 141 05/12/2020  ? HDL 67.70 05/12/2020  ? L[place]58 05/12/2020  ? LDLDIRECT 183.6 01/31/2011  ? TRIG 74.0 05/12/2020  ? CHOLHDL 2 05/12/2020  ? ? ? ?  Latest Ref Rng & Units 05/12/2020  ?  8:43 AM 05/30/2019  ? 10:18 AM 04/10/2018  ? 12:20 PM  ?Hepatic Function  ?Total Protein 6.0 - 8.3 g/dL 6.1   7.0   6.7    ?Albumin 3.5 - 5.2 g/dL 3.9   4.0   4.3    ?AST 0 - 37 U/L '16   18   19    ' ?ALT 0 - 53 U/L '20   23   24    ' ?Alk Phosphatase 39 - 117 U/L 70   84   78    ?Total Bilirubin 0.2 - 1.2 mg/dL 0.5   0.6   0.7    ?Bilirubin, Direct 0.0 - 0.3 mg/dL 0.1  0.1     ? ? ?Lab Results  ?Component Value Date/Time  ? TSH 4.38 05/27/2021 01:45 PM  ? TSH 6.48 (H) 01/20/2021 04:08 PM  ? FREET4 0.77 07/28/2015 09:54 AM  ? FREET4 0.69 03/30/2015 09:58 AM  ? ? ? ?  Latest Ref Rng & Units 05/12/2020  ?  8:43 AM 05/30/2019  ? 10:18 AM 04/10/2018  ? 12:20 PM  ?CBC  ?WBC 4.0 - 10.5 K/uL 5.2   5.5   5.4    ?Hemoglobin 13.0 - 17.0 g/dL 13.9   15.6   15.8    ?Hematocrit 39.0 - 52.0 % 41.7   46.6   47.1    ?Platelets 150.0 - 400.0 K/uL 195.0   211.0   192.0    ? ? ?No results found for: VD25OH ? ?Clinical ASCVD: No  ?The 10-year ASCVD risk score (Arnett DK, et al., 2019) is: 29% ?  Values used to calculate the score: ?    Age: 71 years ?    Sex: Male ?    Is Non-Hispanic African American: No ?    Diabetic: Yes ?    Tobacco smoker: No ?    Systolic Blood Pressure: 680 mmHg ?    Is BP treated: Yes ?    HDL Cholesterol: 67.7 mg/dL ?    Total Cholesterol: 141 mg/dL   ? ? ?  02/12/2021  ?  2:02  PM 02/12/2021  ?  1:54 PM 01/20/2021  ?  3:19 PM  ?Depression screen PHQ 2/9  ?Decreased Interest 0 0 0  ?Down, Depressed, Hopeless 0 0 0  ?PHQ - 2 Score 0 0 0  ?  ? ?Social History  ? ?Tobacco Use  ?Smoking Status Never  ?Smokeless Tobacco Never  ? ?BP Readings from Last 3 Encounters:  ?05/27/21 132/82  ?01/20/21 126/78  ?08/26/20 130/78  ? ?Pulse Readings from Last 3 Encounters:  ?05/27/21 97  ?01/20/21 90  ?08/26/20 80  ? ?Wt Readings from Last 3 Encounters:  ?05/27/21 173 lb (78.5 kg)  ?01/20/21 176 lb (79.8 kg)  ?08/26/20 176 lb (79.8 kg)  ? ?BMI Readings from Last 3 Encounters:  ?05/27/21 24.82 kg/m?  ?01/20/21 25.25 kg/m?  ?08/26/20 25.25 kg/m?  ? ? ?Assessment/Interventions: Review of patient past medical history, allergies, medications, health status, including review of consultants reports, laboratory and other test data, was performed as part of comprehensive evaluation and provision of chronic care management services.  ? ?SDOH:  (Social Determinants of Health) assessments and interventions performed: Yes ? ?SDOH Screenings  ? ?Alcohol Screen: Low Risk   ? Last Alcohol Screening Score (AUDIT): 2  ?Depression (PHQ2-9): Low Risk   ? PHQ-2 Score: 0  ?Financial Resource Strain: Low Risk   ? Difficulty of Paying Living Expenses: Not hard at all  ?Food Insecurity: No Food Insecurity  ? Worried About Charity fundraiser in the Last Year: Never true  ? Ran Out of Food in the Last Year: Never true  ?Housing: Low Risk   ? Last Housing Risk Score: 0  ?Physical Activity: Sufficiently Active  ? Days of Exercise per Week: 3 days  ? Minutes of Exercise per Session: 60 min  ?Social Connections: Moderately Integrated  ? Frequency of Communication with Friends and Family: Twice a week  ? Frequency of Social Gatherings with Friends and Family: Twice a week  ? Attends Religious Services: More than 4 times per year  ? Active Member of Clubs or Organizations: No  ? Attends Club  or Organization Meetings: Never  ? Marital Status:  Married  ?Stress: No Stress Concern Present  ? Feeling of Stress : Not at all  ?Tobacco Use: Low Risk   ? Smoking Tobacco Use: Never  ? Smokeless Tobacco Use: Never  ? Passive Exposure: Not on file  ?Transportation Needs: No Transportation Needs  ? Lack of Transportation (Medical): No  ? Lack of Transportation (Non-Medical): No  ? ? ?CCM Care Plan ? ?No Known Allergies ? ?Medications Reviewed Today   ? ? Reviewed by [name], MD (Physician) on 05/27/21 at 1328  Med List Status: <None>  ? ?Medication Order Taking? Sig Documenting Provider Last Dose Status Informant  ?B-D ULTRAFINE III SHORT PEN 31G X 8 MM MISC [id number] Yes USE DAILY WITH INSULIN [name], MD Taking Active   ?Continuous Blood Gluc Receiver ([place]) DEVI [id number] Yes 1 Act by Does not apply route daily. [name], MD Taking Active   ?Continuous Blood Gluc Sensor (DEXCOM G6 SENSOR) MISC [id number] Yes 1 Act by Does not apply route daily. [name], MD Taking Active   ?Continuous Blood Gluc Transmit (DEXCOM G6 TRANSMITTER) MISC [id number] Yes 1 Act by Does not apply route daily. [name], MD Taking Active   ?empagliflozin (JARDIANCE) 25 MG TABS tablet [id number] Yes Take 1 tablet (25 mg total) by mouth daily. [name], MD Taking Active   ?insulin glargine (LANTUS SOLOSTAR) 100 UNIT/ML Solostar Pen [id number] Yes Inject 20 Units into the skin daily. [name], MD Taking Active   ?metFORMIN (GLUCOPHAGE-XR) 750 MG 24 hr tablet [id number] Yes Take 2 tablets (1,500 mg total) by mouth daily with breakfast. [name], MD Taking Active   ?rosuvastatin (CRESTOR) 10 MG tablet [id number] Yes Take 1 tablet (10 mg total) by mouth daily. [name], MD Taking Active   ?Semaglutide (RYBELSUS) 7 MG TABS [id number] Yes Take 1 tablet by mouth daily. [name], MD Taking Active   ?telmisartan (MICARDIS) 40 MG tablet [id number] Yes Take 1 tablet (40 mg total) by mouth daily. [name], MD Taking  Active   ? ?  ?  ? ?  ? ? ?Patient Active Problem List  ? Diagnosis Date Noted  ? Encounter for general adult medical examination with abnormal findings 01/20/2021  ? Type 2 diabetes mellitus with complica

## 2021-07-01 ENCOUNTER — Telehealth: Payer: Self-pay

## 2021-07-01 NOTE — Progress Notes (Signed)
? ? ?Chronic Care Management ?Pharmacy Assistant  ? ?Name: Clifford Brown  MRN: [id number] DOB: Apr 03, 1950 ? ? ? ?Reason for Encounter: Disease State-General ?  ? ?Recent office visits:  ?05/27/21 [name], MD-PCP (Hypertension) Orders: BMP, Hg A1c; Med changes:Semaglutide (RYBELSUS) 14 MG TABS ? ?Recent consult visits:  ?None since last coordination call ? ?Hospital visits:  ?None since last coordination call ? ?Medications: ?Outpatient Encounter Medications as of 07/01/2021  ?Medication Sig  ? B-D ULTRAFINE III SHORT PEN 31G X 8 MM MISC USE DAILY WITH INSULIN  ? Continuous Blood Gluc Receiver (DEXCOM G6 RECEIVER) DEVI 1 Act by Does not apply route daily.  ? Continuous Blood Gluc Sensor (DEXCOM G6 SENSOR) MISC 1 Act by Does not apply route daily.  ? Continuous Blood Gluc Transmit (DEXCOM G6 TRANSMITTER) MISC 1 Act by Does not apply route daily.  ? empagliflozin (JARDIANCE) 25 MG TABS tablet Take 1 tablet (25 mg total) by mouth daily.  ? insulin glargine (LANTUS SOLOSTAR) 100 UNIT/ML Solostar Pen Inject 20 Units into the skin daily.  ? metFORMIN (GLUCOPHAGE-XR) 750 MG 24 hr tablet Take 2 tablets (1,500 mg total) by mouth daily with breakfast.  ? rosuvastatin (CRESTOR) 10 MG tablet Take 1 tablet (10 mg total) by mouth daily.  ? Semaglutide (RYBELSUS) 14 MG TABS Take 1 tablet (14 mg total) by mouth daily.  ? telmisartan (MICARDIS) 40 MG tablet Take 1 tablet (40 mg total) by mouth daily.  ? ?No facility-administered encounter medications on file as of 07/01/2021.  ? ? ?Recent Office Vitals: ?BP Readings from Last 3 Encounters:  ?05/27/21 132/82  ?01/20/21 126/78  ?08/26/20 130/78  ? ?Pulse Readings from Last 3 Encounters:  ?05/27/21 97  ?01/20/21 90  ?08/26/20 80  ?  ?Wt Readings from Last 3 Encounters:  ?05/27/21 173 lb (78.5 kg)  ?01/20/21 176 lb (79.8 kg)  ?08/26/20 176 lb (79.8 kg)  ?  ? ?Kidney Function ?Lab Results  ?Component Value Date/Time  ? CREATININE 1.15 05/27/2021 01:45 PM  ? CREATININE 1.05 01/20/2021  04:08 PM  ? CREATININE 1.08 12/12/2019 09:50 AM  ? GFR 64.42 05/27/2021 01:45 PM  ? GFRNONAA 70 12/12/2019 09:50 AM  ? GFRAA 81 12/12/2019 09:50 AM  ? ? ? ?  Latest Ref Rng & Units 05/27/2021  ?  1:45 PM 01/20/2021  ?  4:08 PM 08/26/2020  ?  8:57 AM  ?BMP  ?Glucose 70 - 99 mg/dL 187   80   137    ?BUN 6 - 23 mg/dL '23   18   24    '$ ?Creatinine 0.40 - 1.50 mg/dL 1.15   1.05   1.07    ?Sodium 135 - 145 mEq/L 138   139   138    ?Potassium 3.5 - 5.1 mEq/L 4.3   3.7   4.2    ?Chloride 96 - 112 mEq/L 103   102   104    ?CO2 19 - 32 mEq/L '26   28   24    '$ ?Calcium 8.4 - 10.5 mg/dL 9.5   9.4   9.7    ? ?Reviewed chart prior to disease state call. Spoke with patient regarding BP ? ?Current antihypertensive regimen:  ?Telmisartan 40 mg ? ?How often are you checking your Blood Pressure?  Patient states that he sometimes stop at a drugstore and check his blood pressure ? ?Current home BP readings:Patient states that last he remember blood pressure was 118/78 ? ?What recent interventions/DTPs have been made by  any provider to improve Blood Pressure control since last CPP Visit: His blood pressure is adequately well controlled. Basic metabolic panel; Future ? ?Any recent hospitalizations or ED visits since last visit with CPP? No, not since last coordination call ? ?What diet changes have been made to improve Blood Pressure Control?  ?Patient states that he has not made any changes to his diet ? ?What exercise is being done to improve your Blood Pressure Control?  ?Patient is walking when he can ? ?Adherence Review: ?Is the patient currently on ACE/ARB medication? Yes ?Does the patient have >5 day gap between last estimated fill dates? No ? ?Patient states that he had an appt with clinical pharmacist last week and it said that he no showed. He states that he did not get a call. Reschedule patient for 09/10/21. ?  ?Care Gaps: ?Colonoscopy-NA ?Diabetic Foot Exam-01/20/21 ?Ophthalmology-07/07/20 ?Dexa Scan - Na ?Annual Well Visit -NA  ?Micro  albumin-05/12/20 ?Hemoglobin A1c-05/27/21  ? ?Star Rating Drugs: ?Telmisartan 40 mg-last fill 05/07/21 90 ds ? ?[name] ?Clinical Pharmacist Assistant ?[phone]  ?

## 2021-07-04 ENCOUNTER — Other Ambulatory Visit: Payer: Self-pay | Admitting: Internal Medicine

## 2021-07-04 DIAGNOSIS — E118 Type 2 diabetes mellitus with unspecified complications: Secondary | ICD-10-CM

## 2021-07-23 LAB — HM DIABETES EYE EXAM

## 2021-08-02 ENCOUNTER — Other Ambulatory Visit: Payer: Self-pay | Admitting: Internal Medicine

## 2021-08-02 DIAGNOSIS — I10 Essential (primary) hypertension: Secondary | ICD-10-CM

## 2021-08-02 DIAGNOSIS — E118 Type 2 diabetes mellitus with unspecified complications: Secondary | ICD-10-CM

## 2021-08-02 DIAGNOSIS — E785 Hyperlipidemia, unspecified: Secondary | ICD-10-CM

## 2021-08-02 DIAGNOSIS — Z794 Long term (current) use of insulin: Secondary | ICD-10-CM

## 2021-08-16 ENCOUNTER — Other Ambulatory Visit: Payer: Self-pay | Admitting: Internal Medicine

## 2021-08-16 DIAGNOSIS — E118 Type 2 diabetes mellitus with unspecified complications: Secondary | ICD-10-CM

## 2021-09-10 ENCOUNTER — Telehealth: Payer: Medicare Other

## 2021-09-22 ENCOUNTER — Encounter: Payer: Self-pay | Admitting: Internal Medicine

## 2021-09-22 DIAGNOSIS — E118 Type 2 diabetes mellitus with unspecified complications: Secondary | ICD-10-CM

## 2021-09-22 DIAGNOSIS — Z794 Long term (current) use of insulin: Secondary | ICD-10-CM

## 2021-09-22 MED ORDER — BD PEN NEEDLE SHORT U/F 31G X 8 MM MISC: 1 refills | Status: DC

## 2021-11-10 ENCOUNTER — Other Ambulatory Visit: Payer: Self-pay | Admitting: Internal Medicine

## 2021-11-10 DIAGNOSIS — E118 Type 2 diabetes mellitus with unspecified complications: Secondary | ICD-10-CM

## 2021-12-27 ENCOUNTER — Other Ambulatory Visit: Payer: Self-pay | Admitting: Internal Medicine

## 2021-12-27 DIAGNOSIS — Z794 Long term (current) use of insulin: Secondary | ICD-10-CM

## 2021-12-27 DIAGNOSIS — E118 Type 2 diabetes mellitus with unspecified complications: Secondary | ICD-10-CM

## 2021-12-30 ENCOUNTER — Ambulatory Visit: Payer: Medicare Other | Admitting: Internal Medicine

## 2022-01-20 ENCOUNTER — Other Ambulatory Visit: Payer: Self-pay | Admitting: Internal Medicine

## 2022-01-20 DIAGNOSIS — E118 Type 2 diabetes mellitus with unspecified complications: Secondary | ICD-10-CM

## 2022-01-24 ENCOUNTER — Encounter: Payer: Self-pay | Admitting: Internal Medicine

## 2022-01-24 ENCOUNTER — Ambulatory Visit (INDEPENDENT_AMBULATORY_CARE_PROVIDER_SITE_OTHER): Payer: Medicare Other | Admitting: Internal Medicine

## 2022-01-24 VITALS — BP 138/86 | HR 97 | Temp 98.0°F | Resp 16 | Ht 70.0 in | Wt 170.0 lb

## 2022-01-24 DIAGNOSIS — Z0000 Encounter for general adult medical examination without abnormal findings: Secondary | ICD-10-CM

## 2022-01-24 DIAGNOSIS — E118 Type 2 diabetes mellitus with unspecified complications: Secondary | ICD-10-CM

## 2022-01-24 DIAGNOSIS — Z794 Long term (current) use of insulin: Secondary | ICD-10-CM | POA: Diagnosis not present

## 2022-01-24 DIAGNOSIS — Z23 Encounter for immunization: Secondary | ICD-10-CM | POA: Diagnosis not present

## 2022-01-24 DIAGNOSIS — Z0001 Encounter for general adult medical examination with abnormal findings: Secondary | ICD-10-CM

## 2022-01-24 DIAGNOSIS — E039 Hypothyroidism, unspecified: Secondary | ICD-10-CM | POA: Diagnosis not present

## 2022-01-24 DIAGNOSIS — I10 Essential (primary) hypertension: Secondary | ICD-10-CM

## 2022-01-24 DIAGNOSIS — E785 Hyperlipidemia, unspecified: Secondary | ICD-10-CM | POA: Diagnosis not present

## 2022-01-24 LAB — HEPATIC FUNCTION PANEL
ALT: 25 U/L (ref 0–53)
AST: 23 U/L (ref 0–37)
Albumin: 4.4 g/dL (ref 3.5–5.2)
Alkaline Phosphatase: 64 U/L (ref 39–117)
Bilirubin, Direct: 0.1 mg/dL (ref 0.0–0.3)
Total Bilirubin: 0.5 mg/dL (ref 0.2–1.2)
Total Protein: 7.2 g/dL (ref 6.0–8.3)

## 2022-01-24 LAB — MICROALBUMIN / CREATININE URINE RATIO
Creatinine,U: 58 mg/dL
Microalb Creat Ratio: 6.2 mg/g (ref 0.0–30.0)
Microalb, Ur: 3.6 mg/dL — ABNORMAL HIGH (ref 0.0–1.9)

## 2022-01-24 LAB — CBC WITH DIFFERENTIAL/PLATELET
Basophils Absolute: 0 10*3/uL (ref 0.0–0.1)
Basophils Relative: 0.8 % (ref 0.0–3.0)
Eosinophils Absolute: 0.1 10*3/uL (ref 0.0–0.7)
Eosinophils Relative: 1.9 % (ref 0.0–5.0)
HCT: 48.6 % (ref 39.0–52.0)
Hemoglobin: 16.1 g/dL (ref 13.0–17.0)
Lymphocytes Relative: 17.6 % (ref 12.0–46.0)
Lymphs Abs: 1 10*3/uL (ref 0.7–4.0)
MCHC: 33.1 g/dL (ref 30.0–36.0)
MCV: 91.3 fl (ref 78.0–100.0)
Monocytes Absolute: 0.3 10*3/uL (ref 0.1–1.0)
Monocytes Relative: 5.3 % (ref 3.0–12.0)
Neutro Abs: 4.4 10*3/uL (ref 1.4–7.7)
Neutrophils Relative %: 74.4 % (ref 43.0–77.0)
Platelets: 198 10*3/uL (ref 150.0–400.0)
RBC: 5.33 Mil/uL (ref 4.22–5.81)
RDW: 12.7 % (ref 11.5–15.5)
WBC: 5.9 10*3/uL (ref 4.0–10.5)

## 2022-01-24 LAB — URINALYSIS, ROUTINE W REFLEX MICROSCOPIC
Bilirubin Urine: NEGATIVE
Hgb urine dipstick: NEGATIVE
Ketones, ur: NEGATIVE
Leukocytes,Ua: NEGATIVE
Nitrite: NEGATIVE
RBC / HPF: NONE SEEN (ref 0–?)
Specific Gravity, Urine: 1.025 (ref 1.000–1.030)
Total Protein, Urine: NEGATIVE
Urine Glucose: >=1000 — AB
Urobilinogen, UA: 0.2 (ref 0.0–1.0)
pH: 6 (ref 5.0–8.0)

## 2022-01-24 LAB — BASIC METABOLIC PANEL
BUN: 17 mg/dL (ref 6–23)
CO2: 26 mEq/L (ref 19–32)
Calcium: 9.8 mg/dL (ref 8.4–10.5)
Chloride: 102 mEq/L (ref 96–112)
Creatinine, Ser: 0.99 mg/dL (ref 0.40–1.50)
GFR: 76.75 mL/min (ref >60.00)
Glucose, Bld: 116 mg/dL — ABNORMAL HIGH (ref 70–99)
Potassium: 4.2 mEq/L (ref 3.5–5.1)
Sodium: 137 mEq/L (ref 135–145)

## 2022-01-24 LAB — HEMOGLOBIN A1C: Hgb A1c MFr Bld: 8.3 % — ABNORMAL HIGH (ref 4.6–6.5)

## 2022-01-24 LAB — TSH: TSH: 3.98 u[IU]/mL (ref 0.35–5.50)

## 2022-01-24 MED ORDER — DEXCOM G6 SENSOR MISC: 1.0000 | Freq: Every day | 5 refills | Status: DC

## 2022-01-24 MED ORDER — TELMISARTAN 40 MG PO TABS: 40.0000 mg | ORAL_TABLET | Freq: Every day | ORAL | 1 refills | Status: DC

## 2022-01-24 MED ORDER — DEXCOM G6 RECEIVER DEVI: 1.0000 | Freq: Every day | 5 refills | Status: DC

## 2022-01-24 MED ORDER — DEXCOM G6 TRANSMITTER MISC: 1.0000 | Freq: Every day | 5 refills | Status: DC

## 2022-01-24 MED ORDER — METFORMIN HCL ER 750 MG PO TB24: 1500.0000 mg | ORAL_TABLET | Freq: Every day | ORAL | 1 refills | Status: DC

## 2022-01-24 MED ORDER — RYBELSUS 14 MG PO TABS: 14.0000 mg | ORAL_TABLET | Freq: Every day | ORAL | 1 refills | Status: DC

## 2022-01-24 MED ORDER — EMPAGLIFLOZIN 25 MG PO TABS: 25.0000 mg | ORAL_TABLET | Freq: Every day | ORAL | 1 refills | Status: DC

## 2022-01-24 MED ORDER — LANTUS SOLOSTAR 100 UNIT/ML ~~LOC~~ SOPN: 20.0000 [IU] | PEN_INJECTOR | Freq: Every day | SUBCUTANEOUS | 1 refills | Status: DC

## 2022-01-24 MED ORDER — ROSUVASTATIN CALCIUM 10 MG PO TABS: 10.0000 mg | ORAL_TABLET | Freq: Every day | ORAL | 1 refills | Status: DC

## 2022-01-24 NOTE — Progress Notes (Unsigned)
Subjective:  Patient ID: Clifford Brown, male    DOB: Sep 16, 1950  Age: 71 y.o. MRN: [id number]  CC: Annual Exam, Hypertension, Hyperlipidemia, and Diabetes   HPI Edson Deridder presents for a CPX and f/up -   Outpatient Medications Prior to Visit  Medication Sig Dispense Refill   Insulin Pen Needle (B-D ULTRAFINE III SHORT PEN) 31G X 8 MM MISC USE DAILY WITH INSULIN 100 each 1   Continuous Blood Gluc Receiver (DEXCOM G6 RECEIVER) DEVI 1 Act by Does not apply route daily. 1 each 5   Continuous Blood Gluc Sensor (DEXCOM G6 SENSOR) MISC 1 Act by Does not apply route daily. 1 each 5   Continuous Blood Gluc Transmit (DEXCOM G6 TRANSMITTER) MISC 1 Act by Does not apply route daily. 1 each 5   insulin glargine (LANTUS SOLOSTAR) 100 UNIT/ML Solostar Pen Inject 20 Units into the skin daily. 45 mL 1   JARDIANCE 25 MG TABS tablet TAKE 1 TABLET (25 MG TOTAL) BY MOUTH DAILY. 90 tablet 1   metFORMIN (GLUCOPHAGE-XR) 750 MG 24 hr tablet TAKE 2 TABLETS (1,500 MG TOTAL) BY MOUTH EVERY DAY WITH BREAKFAST 180 tablet 0   rosuvastatin (CRESTOR) 10 MG tablet TAKE 1 TABLET BY MOUTH EVERY DAY 90 tablet 1   RYBELSUS 14 MG TABS TAKE 1 TABLET (14 MG TOTAL) BY MOUTH DAILY 90 tablet 0   telmisartan (MICARDIS) 40 MG tablet TAKE 1 TABLET BY MOUTH EVERY DAY 90 tablet 1   No facility-administered medications prior to visit.    ROS Review of Systems  Objective:  BP 138/86 (BP Location: Left Arm, Patient Position: Sitting, Cuff Size: Large)   Pulse 97   Temp 98 F (36.7 C) (Oral)   Resp 16   Ht '5\' 10"'$  (1.778 m)   Wt 170 lb (77.1 kg)   SpO2 99%   BMI 24.39 kg/m   BP Readings from Last 3 Encounters:  01/24/22 138/86  05/27/21 132/82  01/20/21 126/78    Wt Readings from Last 3 Encounters:  01/24/22 170 lb (77.1 kg)  05/27/21 173 lb (78.5 kg)  01/20/21 176 lb (79.8 kg)    Physical Exam Cardiovascular:     Rate and Rhythm: Normal rate and regular rhythm.     Heart sounds: Normal heart sounds, S1 normal  and S2 normal.     Comments: EKG- NSR, 87 bpm Mild LVH No Q waves Musculoskeletal:     Right lower leg: No edema.     Left lower leg: No edema.     Lab Results  Component Value Date   WBC 5.9 01/24/2022   HGB 16.1 01/24/2022   HCT 48.6 01/24/2022   PLT 198.0 01/24/2022   GLUCOSE 116 (H) 01/24/2022   CHOL 141 05/12/2020   TRIG 74.0 05/12/2020   HDL 67.70 05/12/2020   LDLDIRECT 183.6 01/31/2011   LDLCALC 58 05/12/2020   ALT 25 01/24/2022   AST 23 01/24/2022   NA 137 01/24/2022   K 4.2 01/24/2022   CL 102 01/24/2022   CREATININE 0.99 01/24/2022   BUN 17 01/24/2022   CO2 26 01/24/2022   TSH 3.98 01/24/2022   PSA 0.00 (L) 05/12/2020   HGBA1C 8.3 (H) 01/24/2022   MICROALBUR 3.6 (H) 01/24/2022    DG Chest 2 View  Result Date: 04/10/2018 CLINICAL DATA:  Cough EXAM: CHEST - 2 VIEW COMPARISON:  12/03/2008 FINDINGS: Heart and mediastinal contours are within normal limits. No focal opacities or effusions. No acute bony abnormality. IMPRESSION: No active cardiopulmonary disease.  Electronically Signed   By: Rolm Baptise M.D.   On: 04/10/2018 17:15    Assessment & Plan:   Takeru was seen today for annual exam, hypertension, hyperlipidemia and diabetes.  Diagnoses and all orders for this visit:  Essential hypertension, benign -     Urinalysis, Routine w reflex microscopic; Future -     Hepatic function panel; Future -     CBC with Differential/Platelet; Future -     Basic metabolic panel; Future -     EKG 12-Lead -     Basic metabolic panel -     CBC with Differential/Platelet -     Hepatic function panel -     Urinalysis, Routine w reflex microscopic -     telmisartan (MICARDIS) 40 MG tablet; Take 1 tablet (40 mg total) by mouth daily.  Acquired hypothyroidism -     TSH; Future -     CBC with Differential/Platelet; Future -     CBC with Differential/Platelet -     TSH  Type 2 diabetes mellitus with complication, with long-term current use of insulin (HCC) -      Microalbumin / creatinine urine ratio; Future -     Hemoglobin A1c; Future -     Hepatic function panel; Future -     Basic metabolic panel; Future -     HM Diabetes Foot Exam -     Basic metabolic panel -     Hepatic function panel -     Hemoglobin A1c -     Microalbumin / creatinine urine ratio -     telmisartan (MICARDIS) 40 MG tablet; Take 1 tablet (40 mg total) by mouth daily. -     metFORMIN (GLUCOPHAGE-XR) 750 MG 24 hr tablet; Take 2 tablets (1,500 mg total) by mouth daily with breakfast.  Dyslipidemia (high LDL; low HDL) -     Hepatic function panel; Future -     Hepatic function panel -     rosuvastatin (CRESTOR) 10 MG tablet; Take 1 tablet (10 mg total) by mouth daily.  Encounter for general adult medical examination with abnormal findings  Need for vaccination -     Flu Vaccine QUAD High Dose(Fluad)  Type II diabetes mellitus with manifestations ([place]) -     telmisartan (MICARDIS) 40 MG tablet; Take 1 tablet (40 mg total) by mouth daily. -     Semaglutide (RYBELSUS) 14 MG TABS; Take 1 tablet (14 mg total) by mouth daily. -     metFORMIN (GLUCOPHAGE-XR) 750 MG 24 hr tablet; Take 2 tablets (1,500 mg total) by mouth daily with breakfast. -     empagliflozin (JARDIANCE) 25 MG TABS tablet; Take 1 tablet (25 mg total) by mouth daily. -     insulin glargine (LANTUS SOLOSTAR) 100 UNIT/ML Solostar Pen; Inject 20 Units into the skin daily.  Type 2 diabetes mellitus with complication, without long-term current use of insulin (HCC) -     telmisartan (MICARDIS) 40 MG tablet; Take 1 tablet (40 mg total) by mouth daily. -     metFORMIN (GLUCOPHAGE-XR) 750 MG 24 hr tablet; Take 2 tablets (1,500 mg total) by mouth daily with breakfast.  Encounter for long-term (current) use of insulin (HCC) -     Continuous Blood Gluc Transmit (DEXCOM G6 TRANSMITTER) MISC; 1 Act by Does not apply route daily. -     Continuous Blood Gluc Sensor (DEXCOM G6 SENSOR) MISC; 1 Act by Does not apply route  daily. -  Continuous Blood Gluc Receiver ([place]) DEVI; 1 Act by Does not apply route daily.   I have changed Clifford Brown "Gene"'s Jardiance to empagliflozin. I have also changed his telmisartan, Rybelsus, rosuvastatin, and metFORMIN. I am also having him maintain his B-D ULTRAFINE III SHORT PEN, Lantus SoloStar, Dexcom G6 Transmitter, Dexcom G6 Sensor, and Dexcom G6 Receiver.  Meds ordered this encounter  Medications   telmisartan (MICARDIS) 40 MG tablet    Sig: Take 1 tablet (40 mg total) by mouth daily.    Dispense:  90 tablet    Refill:  1   Semaglutide (RYBELSUS) 14 MG TABS    Sig: Take 1 tablet (14 mg total) by mouth daily.    Dispense:  90 tablet    Refill:  1    APPOINTMENT NEEDED FOR ADDITIONAL REFILLS   rosuvastatin (CRESTOR) 10 MG tablet    Sig: Take 1 tablet (10 mg total) by mouth daily.    Dispense:  90 tablet    Refill:  1   metFORMIN (GLUCOPHAGE-XR) 750 MG 24 hr tablet    Sig: Take 2 tablets (1,500 mg total) by mouth daily with breakfast.    Dispense:  180 tablet    Refill:  1    DX Code Needed  APPOINTMENT MADE PLEASE SEND REFILL.   empagliflozin (JARDIANCE) 25 MG TABS tablet    Sig: Take 1 tablet (25 mg total) by mouth daily.    Dispense:  90 tablet    Refill:  1   insulin glargine (LANTUS SOLOSTAR) 100 UNIT/ML Solostar Pen    Sig: Inject 20 Units into the skin daily.    Dispense:  45 mL    Refill:  1    DX Code Needed  .   Continuous Blood Gluc Transmit (DEXCOM G6 TRANSMITTER) MISC    Sig: 1 Act by Does not apply route daily.    Dispense:  1 each    Refill:  5   Continuous Blood Gluc Sensor (DEXCOM G6 SENSOR) MISC    Sig: 1 Act by Does not apply route daily.    Dispense:  1 each    Refill:  5   Continuous Blood Gluc Receiver (DEXCOM G6 RECEIVER) DEVI    Sig: 1 Act by Does not apply route daily.    Dispense:  1 each    Refill:  5     Follow-up: Return in about 6 months (around 07/25/2022).  [name], MD

## 2022-01-24 NOTE — Patient Instructions (Signed)
Health Maintenance, Male Adopting a healthy lifestyle and getting preventive care are important in promoting health and wellness. Ask your health care provider about: The right schedule for you to have regular tests and exams. Things you can do on your own to prevent diseases and keep yourself healthy. What should I know about diet, weight, and exercise? Eat a healthy diet  Eat a diet that includes plenty of vegetables, fruits, low-fat dairy products, and lean protein. Do not eat a lot of foods that are high in solid fats, added sugars, or sodium. Maintain a healthy weight Body mass index (BMI) is a measurement that can be used to identify possible weight problems. It estimates body fat based on height and weight. Your health care provider can help determine your BMI and help you achieve or maintain a healthy weight. Get regular exercise Get regular exercise. This is one of the most important things you can do for your health. Most adults should: Exercise for at least 150 minutes each week. The exercise should increase your heart rate and make you sweat (moderate-intensity exercise). Do strengthening exercises at least twice a week. This is in addition to the moderate-intensity exercise. Spend less time sitting. Even light physical activity can be beneficial. Watch cholesterol and blood lipids Have your blood tested for lipids and cholesterol at 71 years of age, then have this test every 5 years. You may need to have your cholesterol levels checked more often if: Your lipid or cholesterol levels are high. You are older than 71 years of age. You are at high risk for heart disease. What should I know about cancer screening? Many types of cancers can be detected early and may often be prevented. Depending on your health history and family history, you may need to have cancer screening at various ages. This may include screening for: Colorectal cancer. Prostate cancer. Skin cancer. Lung  cancer. What should I know about heart disease, diabetes, and high blood pressure? Blood pressure and heart disease High blood pressure causes heart disease and increases the risk of stroke. This is more likely to develop in people who have high blood pressure readings or are overweight. Talk with your health care provider about your target blood pressure readings. Have your blood pressure checked: Every 3-5 years if you are 18-39 years of age. Every year if you are 40 years old or older. If you are between the ages of 65 and 75 and are a current or former smoker, ask your health care provider if you should have a one-time screening for abdominal aortic aneurysm (AAA). Diabetes Have regular diabetes screenings. This checks your fasting blood sugar level. Have the screening done: Once every three years after age 45 if you are at a normal weight and have a low risk for diabetes. More often and at a younger age if you are overweight or have a high risk for diabetes. What should I know about preventing infection? Hepatitis B If you have a higher risk for hepatitis B, you should be screened for this virus. Talk with your health care provider to find out if you are at risk for hepatitis B infection. Hepatitis C Blood testing is recommended for: Everyone born from 1945 through 1965. Anyone with known risk factors for hepatitis C. Sexually transmitted infections (STIs) You should be screened each year for STIs, including gonorrhea and chlamydia, if: You are sexually active and are younger than 71 years of age. You are older than 71 years of age and your   health care provider tells you that you are at risk for this type of infection. Your sexual activity has changed since you were last screened, and you are at increased risk for chlamydia or gonorrhea. Ask your health care provider if you are at risk. Ask your health care provider about whether you are at high risk for HIV. Your health care provider  may recommend a prescription medicine to help prevent HIV infection. If you choose to take medicine to prevent HIV, you should first get tested for HIV. You should then be tested every 3 months for as long as you are taking the medicine. Follow these instructions at home: Alcohol use Do not drink alcohol if your health care provider tells you not to drink. If you drink alcohol: Limit how much you have to 0-2 drinks a day. Know how much alcohol is in your drink. In the [place] one drink equals one 12 oz bottle of beer (355 mL), one 5 oz glass of wine (148 mL), or one 1 oz glass of hard liquor (44 mL). Lifestyle Do not use any products that contain nicotine or tobacco. These products include cigarettes, chewing tobacco, and vaping devices, such as e-cigarettes. If you need help quitting, ask your health care provider. Do not use street drugs. Do not share needles. Ask your health care provider for help if you need support or information about quitting drugs. General instructions Schedule regular health, dental, and eye exams. Stay current with your vaccines. Tell your health care provider if: You often feel depressed. You have ever been abused or do not feel safe at home. Summary Adopting a healthy lifestyle and getting preventive care are important in promoting health and wellness. Follow your health care provider's instructions about healthy diet, exercising, and getting tested or screened for diseases. Follow your health care provider's instructions on monitoring your cholesterol and blood pressure. This information is not intended to replace advice given to you by your health care provider. Make sure you discuss any questions you have with your health care provider. Document Revised: 07/20/2020 Document Reviewed: 07/20/2020 Elsevier Patient Education  2023 [employer]  

## 2022-01-25 MED ORDER — SHINGRIX 50 MCG/0.5ML IM SUSR: 0.5000 mL | Freq: Once | INTRAMUSCULAR | 1 refills | Status: AC

## 2022-02-11 NOTE — Patient Instructions (Signed)
Health Maintenance, Male Adopting a healthy lifestyle and getting preventive care are important in promoting health and wellness. Ask your health care provider about: The right schedule for you to have regular tests and exams. Things you can do on your own to prevent diseases and keep yourself healthy. What should I know about diet, weight, and exercise? Eat a healthy diet  Eat a diet that includes plenty of vegetables, fruits, low-fat dairy products, and lean protein. Do not eat a lot of foods that are high in solid fats, added sugars, or sodium. Maintain a healthy weight Body mass index (BMI) is a measurement that can be used to identify possible weight problems. It estimates body fat based on height and weight. Your health care provider can help determine your BMI and help you achieve or maintain a healthy weight. Get regular exercise Get regular exercise. This is one of the most important things you can do for your health. Most adults should: Exercise for at least 150 minutes each week. The exercise should increase your heart rate and make you sweat (moderate-intensity exercise). Do strengthening exercises at least twice a week. This is in addition to the moderate-intensity exercise. Spend less time sitting. Even light physical activity can be beneficial. Watch cholesterol and blood lipids Have your blood tested for lipids and cholesterol at 71 years of age, then have this test every 5 years. You may need to have your cholesterol levels checked more often if: Your lipid or cholesterol levels are high. You are older than 71 years of age. You are at high risk for heart disease. What should I know about cancer screening? Many types of cancers can be detected early and may often be prevented. Depending on your health history and family history, you may need to have cancer screening at various ages. This may include screening for: Colorectal cancer. Prostate cancer. Skin cancer. Lung  cancer. What should I know about heart disease, diabetes, and high blood pressure? Blood pressure and heart disease High blood pressure causes heart disease and increases the risk of stroke. This is more likely to develop in people who have high blood pressure readings or are overweight. Talk with your health care provider about your target blood pressure readings. Have your blood pressure checked: Every 3-5 years if you are 18-39 years of age. Every year if you are 40 years old or older. If you are between the ages of 65 and 75 and are a current or former smoker, ask your health care provider if you should have a one-time screening for abdominal aortic aneurysm (AAA). Diabetes Have regular diabetes screenings. This checks your fasting blood sugar level. Have the screening done: Once every three years after age 45 if you are at a normal weight and have a low risk for diabetes. More often and at a younger age if you are overweight or have a high risk for diabetes. What should I know about preventing infection? Hepatitis B If you have a higher risk for hepatitis B, you should be screened for this virus. Talk with your health care provider to find out if you are at risk for hepatitis B infection. Hepatitis C Blood testing is recommended for: Everyone born from 1945 through 1965. Anyone with known risk factors for hepatitis C. Sexually transmitted infections (STIs) You should be screened each year for STIs, including gonorrhea and chlamydia, if: You are sexually active and are younger than 71 years of age. You are older than 71 years of age and your   health care provider tells you that you are at risk for this type of infection. Your sexual activity has changed since you were last screened, and you are at increased risk for chlamydia or gonorrhea. Ask your health care provider if you are at risk. Ask your health care provider about whether you are at high risk for HIV. Your health care provider  may recommend a prescription medicine to help prevent HIV infection. If you choose to take medicine to prevent HIV, you should first get tested for HIV. You should then be tested every 3 months for as long as you are taking the medicine. Follow these instructions at home: Alcohol use Do not drink alcohol if your health care provider tells you not to drink. If you drink alcohol: Limit how much you have to 0-2 drinks a day. Know how much alcohol is in your drink. In the [place] one drink equals one 12 oz bottle of beer (355 mL), one 5 oz glass of Daelynn Blower (148 mL), or one 1 oz glass of hard liquor (44 mL). Lifestyle Do not use any products that contain nicotine or tobacco. These products include cigarettes, chewing tobacco, and vaping devices, such as e-cigarettes. If you need help quitting, ask your health care provider. Do not use street drugs. Do not share needles. Ask your health care provider for help if you need support or information about quitting drugs. General instructions Schedule regular health, dental, and eye exams. Stay current with your vaccines. Tell your health care provider if: You often feel depressed. You have ever been abused or do not feel safe at home. Summary Adopting a healthy lifestyle and getting preventive care are important in promoting health and wellness. Follow your health care provider's instructions about healthy diet, exercising, and getting tested or screened for diseases. Follow your health care provider's instructions on monitoring your cholesterol and blood pressure. This information is not intended to replace advice given to you by your health care provider. Make sure you discuss any questions you have with your health care provider. Document Revised: 07/20/2020 Document Reviewed: 07/20/2020 Elsevier Patient Education  2023 [employer]  

## 2022-02-11 NOTE — Progress Notes (Unsigned)
Subjective:   Clifford Brown is a 71 y.o. male who presents for Medicare Annual/Subsequent preventive examination. I connected with  [name] on 02/14/22 by a audio enabled telemedicine application and verified that I am speaking with the correct person using two identifiers.  Patient Location: Home  Provider Location: Home Office  I discussed the limitations of evaluation and management by telemedicine. The patient expressed understanding and agreed to proceed.  Review of Systems    Deferred to PCP Cardiac Risk Factors include: advanced age (>55mn, >>35women);diabetes mellitus;dyslipidemia;male gender;hypertension     Objective:    There were no vitals filed for this visit. There is no height or weight on file to calculate BMI.     02/14/2022    8:55 AM 02/12/2021    1:55 PM 11/27/2019    8:51 AM 11/21/2018    8:24 AM 10/23/2017    8:32 AM 05/02/2016    4:54 PM 09/29/2014    9:20 AM  Advanced Directives  Does Patient Have a Medical Advance Directive? No No Yes No No Yes No  Type of Advance Directive   Living will   [place]   Does patient want to make changes to medical advance directive?   No - Patient declined      Copy of HG[place]leyin Chart?      Yes   Would patient like information on creating a medical advance directive? No - Patient declined No - Patient declined  No - Patient declined Yes (ED - Information included in AVS)  Yes - Educational materials given    Current Medications (verified) Outpatient Encounter Medications as of 02/14/2022  Medication Sig   Continuous Blood Gluc Receiver (DEXCOM G6 RECEIVER) DEVI 1 Act by Does not apply route daily.   Continuous Blood Gluc Sensor (DEXCOM G6 SENSOR) MISC 1 Act by Does not apply route daily.   Continuous Blood Gluc Transmit (DEXCOM G6 TRANSMITTER) MISC 1 Act by Does not apply route daily.   empagliflozin (JARDIANCE) 25 MG TABS tablet Take 1 tablet (25 mg total) by  mouth daily.   insulin glargine (LANTUS SOLOSTAR) 100 UNIT/ML Solostar Pen Inject 20 Units into the skin daily.   Insulin Pen Needle (B-D ULTRAFINE III SHORT PEN) 31G X 8 MM MISC USE DAILY WITH INSULIN   metFORMIN (GLUCOPHAGE-XR) 750 MG 24 hr tablet Take 2 tablets (1,500 mg total) by mouth daily with breakfast.   rosuvastatin (CRESTOR) 10 MG tablet Take 1 tablet (10 mg total) by mouth daily.   Semaglutide (RYBELSUS) 14 MG TABS Take 1 tablet (14 mg total) by mouth daily.   telmisartan (MICARDIS) 40 MG tablet Take 1 tablet (40 mg total) by mouth daily.   No facility-administered encounter medications on file as of 02/14/2022.    Allergies (verified) Patient has no known allergies.   History: Past Medical History:  Diagnosis Date   Cancer ([facility]    Prostate - Dr BAlinda Money   Depression    Diabetes mellitus without complication ([place] [place]    Hyperlipidemia    Hypertension    Past Surgical History:  Procedure Laterality Date   PROSTATE SURGERY     Family History  Problem Relation Age of Onset   Alcohol abuse Other    Cancer Other        Prostate   Stroke Neg Hx    Kidney disease Neg Hx    Hypertension Neg Hx    Hyperlipidemia Neg Hx    Heart  disease Neg Hx    Early death Neg Hx    Diabetes Neg Hx    Social History   Socioeconomic History   Marital status: Married    Spouse name: Not on file   Number of children: 2   Years of education: Not on file   Highest education level: Not on file  Occupational History   Occupation: [employer] part-time  Tobacco Use   Smoking status: Never   Smokeless tobacco: Never  Vaping Use   Vaping Use: Never used  Substance and Sexual Activity   Alcohol use: Yes    Alcohol/week: 35.0 standard drinks of alcohol    Types: 35 Cans of beer per week   Drug use: No   Sexual activity: Yes    Partners: Female  Other Topics Concern   Not on file  Social History Narrative   Not on file   Social Determinants of Health   Financial Resource Strain:  Low Risk  (02/14/2022)   Overall Financial Resource Strain (CARDIA)    Difficulty of Paying Living Expenses: Not hard at all  Food Insecurity: No Food Insecurity (02/14/2022)   Hunger Vital Sign    Worried About Running Out of Food in the Last Year: Never true    Ran Out of Food in the Last Year: Never true  Transportation Needs: No Transportation Needs (02/14/2022)   PRAPARE - Hydrologist (Medical): No    Lack of Transportation (Non-Medical): No  Physical Activity: Sufficiently Active (02/14/2022)   Exercise Vital Sign    Days of Exercise per Week: 5 days    Minutes of Exercise per Session: 50 min  Stress: No Stress Concern Present (02/14/2022)   [place]    Feeling of Stress : Not at all  Social Connections: [place] (02/14/2022)   Social Connection and Isolation Panel [NHANES]    Frequency of Communication with Friends and Family: More than three times a week    Frequency of Social Gatherings with Friends and Family: More than three times a week    Attends Religious Services: More than 4 times per year    Active Member of [employer] or Organizations: Yes    Attends Music therapist: Not on file    Marital Status: Married    Tobacco Counseling Counseling given: Not Answered   Clinical Intake:  Pre-visit preparation completed: Yes  Pain : No/denies pain     Nutritional Risks: None Diabetes: Yes CBG done?: No (phone visit) Did pt. bring in CBG monitor from home?: No (phone visit)  How often do you need to have someone help you when you read instructions, pamphlets, or other written materials from your doctor or pharmacy?: 1 - Never What is the last grade level you completed in school?: college  Diabetic?Yes Nutrition Risk Assessment:  Has the patient had any N/V/D within the last 2 months?  No  Does the patient have any non-healing wounds?  No  Has the  patient had any unintentional weight loss or weight gain?  No   Diabetes:  Is the patient diabetic?  Yes  If diabetic, was a CBG obtained today?  No , phone visit  Did the patient bring in their glucometer from home?  No , phone visit  How often do you monitor your CBG's? CGM.   Financial Strains and Diabetes Management:  Are you having any financial strains with the device, your supplies or your medication?  No .  Does the patient want to be seen by Chronic Care Management for management of their diabetes?  No  Would the patient like to be referred to a Nutritionist or for Diabetic Management?  No   Diabetic Exams:  Diabetic Eye Exam: Completed 07/23/21 Diabetic Foot Exam: Completed 01/24/22   Interpreter Needed?: No  Information entered by :: Emelia Loron RN   Activities of Daily Living    02/14/2022    8:54 AM  In your present state of health, do you have any difficulty performing the following activities:  Hearing? 1  Comment has hearing aids  Vision? 0  Difficulty concentrating or making decisions? 0  Walking or climbing stairs? 0  Dressing or bathing? 0  Doing errands, shopping? 0  Preparing Food and eating ? N  Using the Toilet? N  In the past six months, have you accidently leaked urine? N  Do you have problems with loss of bowel control? N  Managing your Medications? N  Managing your Finances? N  Housekeeping or managing your Housekeeping? N    Patient Care Team: [name], MD as PCP - General (Internal Medicine) Charlton Haws, [facility] as Pharmacist (Pharmacist) Center, [place] any recent Medical Services you may have received from other than [facility] providers in the past year (date may be approximate).     Assessment:   This is a routine wellness examination for Clifford Brown.  Hearing/Vision screen No results found.  Dietary issues and exercise activities discussed: Current Exercise Habits: Home exercise routine, Type of exercise:  walking, Time (Minutes): 45, Frequency (Times/Week): 5, Weekly Exercise (Minutes/Week): 225, Intensity: Mild, Exercise limited by: None identified   Goals Addressed             This Visit's Progress    Patient Stated       I want to increase my physical activity by starting to ride my bike.       Depression Screen    02/14/2022    8:44 AM 02/12/2021    2:02 PM 02/12/2021    1:54 PM 01/20/2021    3:19 PM 11/27/2019    8:49 AM 11/21/2018    8:24 AM 04/10/2018   11:29 AM  PHQ 2/9 Scores  PHQ - 2 Score 0 0 0 0 0 1 0  PHQ- 9 Score      3 0    Fall Risk    02/12/2021    1:56 PM 01/20/2021    3:18 PM 11/27/2019    8:52 AM 11/21/2018    8:24 AM 05/18/2018   10:38 AM  [place] in the past year? 0 0 0 0 0  Number falls in past yr: 0  0 0   Injury with Fall? 0  0 0   Risk for fall due to :   No Fall Risks    Follow up Falls evaluation completed  Falls evaluation completed      [place]:  Any stairs in or around the home? Yes  If so, are there any without handrails? Yes  Home free of loose throw rugs in walkways, pet beds, electrical cords, etc? Yes  Adequate lighting in your home to reduce risk of falls? Yes   ASSISTIVE DEVICES UTILIZED TO PREVENT FALLS:  Life alert? No  Use of a cane, walker or w/c? No  Grab bars in the bathroom? No  Shower chair or bench in shower? No  Elevated toilet seat or a handicapped toilet? No   Cognitive Function:        02/14/2022    8:47 AM 11/27/2019    8:53 AM  6CIT Screen  What Year? 0 points 0 points  What month? 0 points 0 points  What time? 0 points 0 points  Count back from 20 0 points 0 points  Months in reverse 0 points 0 points  Repeat phrase 0 points 0 points  Total Score 0 points 0 points    Immunizations Immunization History  Administered Date(s) Administered   Fluad Quad(high Dose 65+) 11/21/2018, 01/24/2022   Influenza Whole 12/28/2011, 01/03/2013   Influenza, High Dose  Seasonal PF 02/08/2017, 12/04/2017   Influenza,inj,Quad PF,6+ Mos 11/25/2014   Influenza-Unspecified 11/13/2019, 12/08/2020   Moderna SARS-COV2 Booster Vaccination 12/08/2020   PFIZER(Purple Top)SARS-COV-2 Vaccination 05/10/2019, 06/04/2019, 01/21/2020   Pneumococcal Conjugate-13 08/21/2014   Pneumococcal Polysaccharide-23 01/08/2013, 05/16/2018   Td 01/12/2010   Tdap 05/13/2020   Zoster, Live 08/21/2014    TDAP status: Up to date  Flu Vaccine status: Up to date  Pneumococcal vaccine status: Up to date  Covid-19 vaccine status: Information provided on how to obtain vaccines.   Qualifies for Shingles Vaccine? Yes   Zostavax completed No   Shingrix Completed?: No.    Education has been provided regarding the importance of this vaccine. Patient has been advised to call insurance company to determine out of pocket expense if they have not yet received this vaccine. Advised may also receive vaccine at local pharmacy or Health Dept. Verbalized acceptance and understanding.  Screening Tests Health Maintenance  Topic Date Due   Zoster Vaccines- Shingrix (1 of 2) Never done   COVID-19 Vaccine (5 - 2023-24 season) 11/12/2021   OPHTHALMOLOGY EXAM  07/24/2022   HEMOGLOBIN A1C  07/25/2022   Diabetic kidney evaluation - GFR measurement  01/25/2023   Diabetic kidney evaluation - Urine ACR  01/25/2023   FOOT EXAM  01/25/2023   Medicare Annual Wellness (AWV)  02/15/2023   Fecal DNA (Cologuard)  06/03/2024   DTaP/Tdap/Td (3 - Td or Tdap) 05/14/2030   Pneumonia Vaccine 20+ Years old  Completed   INFLUENZA VACCINE  Completed   Hepatitis C Screening  Completed   HPV VACCINES  Aged Out    Health Maintenance  Health Maintenance Due  Topic Date Due   Zoster Vaccines- Shingrix (1 of 2) Never done   COVID-19 Vaccine (5 - 2023-24 season) 11/12/2021    Colorectal cancer screening: Type of screening: Cologuard. Completed 06/03/24. Repeat every 3 years  Lung Cancer Screening: (Low Dose CT Chest  recommended if Age 68-80 years, 30 pack-year currently smoking OR have quit w/in 15years.) does not qualify.   Additional Screening:  Hepatitis C Screening: does qualify; Completed 08/21/14  Vision Screening: Recommended annual ophthalmology exams for early detection of glaucoma and other disorders of the eye. Is the patient up to date with their annual eye exam?  Yes  Who is the provider or what is the name of the office in which the patient attends annual eye exams? [facility] If pt is not established with a provider, would they like to be referred to a provider to establish care?  N/A .   Dental Screening: Recommended annual dental exams for proper oral hygiene  Community Resource Referral / Chronic Care Management: CRR required this visit?  No   CCM required this visit?  No      Plan:     I have personally  reviewed and noted the following in the patient's chart:   Medical and social history Use of alcohol, tobacco or illicit drugs  Current medications and supplements including opioid prescriptions. Patient is not currently taking opioid prescriptions. Functional ability and status Nutritional status Physical activity Advanced directives List of other physicians Hospitalizations, surgeries, and ER visits in previous 12 months Vitals Screenings to include cognitive, depression, and falls Referrals and appointments  In addition, I have reviewed and discussed with patient certain preventive protocols, quality metrics, and best practice recommendations. A written personalized care plan for preventive services as well as general preventive health recommendations were provided to patient.     Michiel Cowboy, RN   02/14/2022   Nurse Notes:  Clifford Brown , Thank you for taking time to come for your Medicare Wellness Visit. I appreciate your ongoing commitment to your health goals. Please review the following plan we discussed and let me know if I can assist you in the  future.   These are the goals we discussed:  Goals      Manage My Medicine     Timeframe:  Long-Range Goal Priority:  High Start Date:         11/05/20                    Expected End Date:   05/08/21                    Follow Up Date Sept 2022   - call for medicine refill 2 or 3 days before it runs out - call if I am sick and can't take my medicine - keep a list of all the medicines I take; vitamins and herbals too  -Restart Rybelsus 3 mg. Once completed, increase to 7 mg -May reduce Lantus by 2 units if fasting BG < 90   Why is this important?   These steps will help you keep on track with your medicines.   Notes:      Patient Stated     Continue to lose weight by getting back to going to [employer], ride my bike in the neighborhood, walk the dogs, and watching my diet for carbohydrates.     Patient Stated     I want to increase my physical activity by starting to ride my bike.         This is a list of the screening recommended for you and due dates:  Health Maintenance  Topic Date Due   Zoster (Shingles) Vaccine (1 of 2) Never done   COVID-19 Vaccine (5 - 2023-24 season) 11/12/2021   Eye exam for diabetics  07/24/2022   Hemoglobin A1C  07/25/2022   Yearly kidney function blood test for diabetes  01/25/2023   Yearly kidney health urinalysis for diabetes  01/25/2023   Complete foot exam   01/25/2023   Medicare Annual Wellness Visit  02/15/2023   Cologuard (Stool DNA test)  06/03/2024   DTaP/Tdap/Td vaccine (3 - Td or Tdap) 05/14/2030   Pneumonia Vaccine  Completed   Flu Shot  Completed   Hepatitis C Screening: USPSTF Recommendation to screen - Ages 54-79 yo.  Completed   HPV Vaccine  Aged Out

## 2022-02-14 ENCOUNTER — Ambulatory Visit (INDEPENDENT_AMBULATORY_CARE_PROVIDER_SITE_OTHER): Payer: Medicare Other | Admitting: *Deleted

## 2022-02-14 DIAGNOSIS — Z0000 Encounter for general adult medical examination without abnormal findings: Secondary | ICD-10-CM

## 2022-05-31 ENCOUNTER — Encounter: Payer: Self-pay | Admitting: Internal Medicine

## 2022-05-31 NOTE — Telephone Encounter (Signed)
Pt need PA on Rybelsus.Marland KitchenJohny Brown

## 2022-06-01 ENCOUNTER — Other Ambulatory Visit (HOSPITAL_COMMUNITY): Payer: Self-pay

## 2022-06-03 ENCOUNTER — Other Ambulatory Visit (HOSPITAL_COMMUNITY): Payer: Self-pay

## 2022-06-03 NOTE — Telephone Encounter (Signed)
Nona Dell, CPhT      Ran test claim and received paid claim. Called pharmacy. No PA is needed but copay is $814.00. Patient will have to call pharmacy and let them know if he wants to get prescription so they can order it.   Copay for med is still high. Any alternatives.Marland KitchenJohny Chess

## 2022-06-08 ENCOUNTER — Other Ambulatory Visit: Payer: Self-pay | Admitting: Internal Medicine

## 2022-06-08 DIAGNOSIS — Z794 Long term (current) use of insulin: Secondary | ICD-10-CM

## 2022-06-29 LAB — HM DIABETES EYE EXAM

## 2022-07-14 ENCOUNTER — Other Ambulatory Visit: Payer: Self-pay | Admitting: Internal Medicine

## 2022-07-14 ENCOUNTER — Encounter: Payer: Self-pay | Admitting: Internal Medicine

## 2022-07-14 DIAGNOSIS — I10 Essential (primary) hypertension: Secondary | ICD-10-CM

## 2022-07-14 DIAGNOSIS — E118 Type 2 diabetes mellitus with unspecified complications: Secondary | ICD-10-CM

## 2022-07-14 DIAGNOSIS — Z794 Long term (current) use of insulin: Secondary | ICD-10-CM

## 2022-07-18 ENCOUNTER — Other Ambulatory Visit: Payer: Self-pay | Admitting: Internal Medicine

## 2022-07-18 DIAGNOSIS — E785 Hyperlipidemia, unspecified: Secondary | ICD-10-CM

## 2022-07-28 ENCOUNTER — Ambulatory Visit: Payer: Medicare Other | Admitting: Internal Medicine

## 2022-07-28 ENCOUNTER — Encounter: Payer: Self-pay | Admitting: Internal Medicine

## 2022-07-28 VITALS — BP 142/86 | HR 87 | Temp 98.3°F | Resp 16 | Ht 70.0 in | Wt 169.0 lb

## 2022-07-28 DIAGNOSIS — Z8546 Personal history of malignant neoplasm of prostate: Secondary | ICD-10-CM | POA: Diagnosis not present

## 2022-07-28 DIAGNOSIS — Z794 Long term (current) use of insulin: Secondary | ICD-10-CM | POA: Diagnosis not present

## 2022-07-28 DIAGNOSIS — E785 Hyperlipidemia, unspecified: Secondary | ICD-10-CM

## 2022-07-28 DIAGNOSIS — E039 Hypothyroidism, unspecified: Secondary | ICD-10-CM | POA: Diagnosis not present

## 2022-07-28 DIAGNOSIS — I10 Essential (primary) hypertension: Secondary | ICD-10-CM

## 2022-07-28 DIAGNOSIS — E118 Type 2 diabetes mellitus with unspecified complications: Secondary | ICD-10-CM

## 2022-07-28 DIAGNOSIS — E119 Type 2 diabetes mellitus without complications: Secondary | ICD-10-CM | POA: Diagnosis not present

## 2022-07-28 LAB — TSH: TSH: 3.67 u[IU]/mL (ref 0.35–5.50)

## 2022-07-28 LAB — PSA: PSA: 0 ng/mL — ABNORMAL LOW (ref 0.10–4.00)

## 2022-07-28 LAB — BASIC METABOLIC PANEL
BUN: 18 mg/dL (ref 6–23)
CO2: 26 mEq/L (ref 19–32)
Calcium: 9.3 mg/dL (ref 8.4–10.5)
Chloride: 103 mEq/L (ref 96–112)
Creatinine, Ser: 0.99 mg/dL (ref 0.40–1.50)
GFR: 76.48 mL/min (ref >60.00)
Glucose, Bld: 131 mg/dL — ABNORMAL HIGH (ref 70–99)
Potassium: 4.6 mEq/L (ref 3.5–5.1)
Sodium: 138 mEq/L (ref 135–145)

## 2022-07-28 LAB — LIPID PANEL
Cholesterol: 139 mg/dL (ref 0–200)
HDL: 75.7 mg/dL (ref >39.00)
LDL Cholesterol: 48 mg/dL (ref 0–99)
NonHDL: 63.42
Total CHOL/HDL Ratio: 2
Triglycerides: 75 mg/dL (ref 0.0–149.0)
VLDL: 15 mg/dL (ref 0.0–40.0)

## 2022-07-28 LAB — HEMOGLOBIN A1C: Hgb A1c MFr Bld: 8 % — ABNORMAL HIGH (ref 4.6–6.5)

## 2022-07-28 MED ORDER — BD PEN NEEDLE SHORT U/F 31G X 8 MM MISC
1 refills | Status: DC
Start: 2022-07-28 — End: 2023-01-05

## 2022-07-28 NOTE — Progress Notes (Signed)
Subjective:  Patient ID: Clifford Brown, male    DOB: [birth year]  Age: 72 y.o. MRN: [id number]  CC: Hypertension, Hypothyroidism, Hyperlipidemia, and Diabetes   HPI Clifford Brown presents for f/up ---  He is an active walker and has good endurance.  He denies chest pain, shortness of breath, diaphoresis, or edema.  Outpatient Medications Prior to Visit  Medication Sig Dispense Refill   Continuous Blood Gluc Transmit (DEXCOM G6 TRANSMITTER) MISC 1 Act by Does not apply route daily. 1 each 5   rosuvastatin (CRESTOR) 10 MG tablet TAKE 1 TABLET BY MOUTH EVERY DAY 90 tablet 0   telmisartan (MICARDIS) 40 MG tablet Take 1 tablet (40 mg total) by mouth daily. 90 tablet 1   Continuous Blood Gluc Receiver (DEXCOM G6 RECEIVER) DEVI 1 Act by Does not apply route daily. 1 each 5   Continuous Blood Gluc Sensor (DEXCOM G6 SENSOR) MISC 1 Act by Does not apply route daily. 1 each 5   empagliflozin (JARDIANCE) 25 MG TABS tablet Take 1 tablet (25 mg total) by mouth daily. 90 tablet 1   insulin glargine (LANTUS SOLOSTAR) 100 UNIT/ML Solostar Pen Inject 20 Units into the skin daily. 45 mL 1   Insulin Pen Needle (B-D ULTRAFINE III SHORT PEN) 31G X 8 MM MISC USE DAILY WITH INSULIN 100 each 1   metFORMIN (GLUCOPHAGE-XR) 750 MG 24 hr tablet Take 2 tablets (1,500 mg total) by mouth daily with breakfast. 180 tablet 1   Semaglutide (RYBELSUS) 14 MG TABS Take 1 tablet (14 mg total) by mouth daily. 90 tablet 1   No facility-administered medications prior to visit.    ROS Review of Systems  Constitutional: Negative.  Negative for diaphoresis and fatigue.  HENT: Negative.    Eyes: Negative.   Respiratory:  Negative for cough, chest tightness, shortness of breath and wheezing.   Cardiovascular:  Negative for chest pain, palpitations and leg swelling.  Gastrointestinal:  Negative for abdominal pain, constipation, diarrhea, nausea and vomiting.  Endocrine: Negative.   Genitourinary: Negative.  Negative for  difficulty urinating and dysuria.  Musculoskeletal: Negative.   Skin: Negative.   Neurological:  Negative for dizziness, weakness, light-headedness and headaches.  Hematological:  Negative for adenopathy. Does not bruise/bleed easily.  Psychiatric/Behavioral: Negative.      Objective:  BP (!) 142/86 (BP Location: Left Arm, Patient Position: Sitting, Cuff Size: Normal)   Pulse 87   Temp 98.3 F (36.8 C) (Oral)   Resp 16   Ht 5\' 10"  (1.778 m)   Wt 169 lb (76.7 kg)   SpO2 97%   BMI 24.25 kg/m   BP Readings from Last 3 Encounters:  07/28/22 (!) 142/86  01/24/22 138/86  05/27/21 132/82    Wt Readings from Last 3 Encounters:  07/28/22 169 lb (76.7 kg)  01/24/22 170 lb (77.1 kg)  05/27/21 173 lb (78.5 kg)    Physical Exam Vitals reviewed.  Constitutional:      Appearance: He is not ill-appearing.  HENT:     Nose: Nose normal.     Mouth/Throat:     Mouth: Mucous membranes are moist.  Eyes:     General: No scleral icterus.    Conjunctiva/sclera: Conjunctivae normal.  Cardiovascular:     Rate and Rhythm: Normal rate and regular rhythm.     Heart sounds: Normal heart sounds, S1 normal and S2 normal. No murmur heard.    Comments: EKG- NSR, 90 bpm No LVH or Q waves Normal EKG Pulmonary:     Effort:  Pulmonary effort is normal.     Breath sounds: No stridor. No wheezing, rhonchi or rales.  Abdominal:     General: Abdomen is flat.     Palpations: There is no mass.     Tenderness: There is no abdominal tenderness. There is no guarding.     Hernia: No hernia is present.  Musculoskeletal:     Cervical back: Neck supple.     Right lower leg: No edema.     Left lower leg: No edema.  Lymphadenopathy:     Cervical: No cervical adenopathy.  Skin:    General: Skin is warm and dry.  Neurological:     General: No focal deficit present.     Mental Status: He is alert. Mental status is at baseline.  Psychiatric:        Mood and Affect: Mood normal.        Behavior: Behavior  normal.     Lab Results  Component Value Date   WBC 5.9 01/24/2022   HGB 16.1 01/24/2022   HCT 48.6 01/24/2022   PLT 198.0 01/24/2022   GLUCOSE 131 (H) 07/28/2022   CHOL 139 07/28/2022   TRIG 75.0 07/28/2022   HDL 75.70 07/28/2022   LDLDIRECT 183.6 01/31/2011   LDLCALC 48 07/28/2022   ALT 25 01/24/2022   AST 23 01/24/2022   NA 138 07/28/2022   K 4.6 07/28/2022   CL 103 07/28/2022   CREATININE 0.99 07/28/2022   BUN 18 07/28/2022   CO2 26 07/28/2022   TSH 3.67 07/28/2022   PSA 0.00 (L) 07/28/2022   HGBA1C 8.0 (H) 07/28/2022   MICROALBUR 3.6 (H) 01/24/2022    DG Chest 2 View  Result Date: 04/10/2018 CLINICAL DATA:  Cough EXAM: CHEST - 2 VIEW COMPARISON:  12/03/2008 FINDINGS: Heart and mediastinal contours are within normal limits. No focal opacities or effusions. No acute bony abnormality. IMPRESSION: No active cardiopulmonary disease. Electronically Signed   By: Charlett Nose M.D.   On: 04/10/2018 17:15    Assessment & Plan:   Dyslipidemia (high LDL; low HDL) - LDL goal achieved. Doing well on the statin.  I have ordered a coronary calcium scan to assess his risk for coronary artery disease. -     Lipid panel; Future -     TSH; Future -     CT CARDIAC SCORING (SELF PAY ONLY); Future  Acquired hypothyroidism- He is euthyroid. -     TSH; Future  Type 2 diabetes mellitus with complication, with long-term current use of insulin (HCC)- His A1c has improved. -     Hemoglobin A1c; Future -     Basic metabolic panel; Future -     BD Pen Needle Short U/F; USE DAILY WITH INSULIN  Dispense: 100 each; Refill: 1 -     CT CARDIAC SCORING (SELF PAY ONLY); Future  Essential hypertension, benign- His blood pressure is not adequately well-controlled.  I encouraged him to continue working on his lifestyle modifications. -     Basic metabolic panel; Future -     CT CARDIAC SCORING (SELF PAY ONLY); Future  PROSTATE CANCER, HX OF- PSA remains undetectable. -     PSA; Future      Follow-up: Return in about 6 months (around 01/28/2023).  Sanda Linger, MD

## 2022-07-28 NOTE — Patient Instructions (Signed)
Hypertension, Adult High blood pressure (hypertension) is when the force of blood pumping through the arteries is too strong. The arteries are the blood vessels that carry blood from the heart throughout the body. Hypertension forces the heart to work harder to pump blood and may cause arteries to become narrow or stiff. Untreated or uncontrolled hypertension can lead to a heart attack, heart failure, a stroke, kidney disease, and other problems. A blood pressure reading consists of a higher number over a lower number. Ideally, your blood pressure should be below 120/80. The first ("top") number is called the systolic pressure. It is a measure of the pressure in your arteries as your heart beats. The second ("bottom") number is called the diastolic pressure. It is a measure of the pressure in your arteries as the heart relaxes. What are the causes? The exact cause of this condition is not known. There are some conditions that result in high blood pressure. What increases the risk? Certain factors may make you more likely to develop high blood pressure. Some of these risk factors are under your control, including: Smoking. Not getting enough exercise or physical activity. Being overweight. Having too much fat, sugar, calories, or salt (sodium) in your diet. Drinking too much alcohol. Other risk factors include: Having a personal history of heart disease, diabetes, high cholesterol, or kidney disease. Stress. Having a family history of high blood pressure and high cholesterol. Having obstructive sleep apnea. Age. The risk increases with age. What are the signs or symptoms? High blood pressure may not cause symptoms. Very high blood pressure (hypertensive crisis) may cause: Headache. Fast or irregular heartbeats (palpitations). Shortness of breath. Nosebleed. Nausea and vomiting. Vision changes. Severe chest pain, dizziness, and seizures. How is this diagnosed? This condition is diagnosed by  measuring your blood pressure while you are seated, with your arm resting on a flat surface, your legs uncrossed, and your feet flat on the floor. The cuff of the blood pressure monitor will be placed directly against the skin of your upper arm at the level of your heart. Blood pressure should be measured at least twice using the same arm. Certain conditions can cause a difference in blood pressure between your right and left arms. If you have a high blood pressure reading during one visit or you have normal blood pressure with other risk factors, you may be asked to: Return on a different day to have your blood pressure checked again. Monitor your blood pressure at home for 1 week or longer. If you are diagnosed with hypertension, you may have other blood or imaging tests to help your health care provider understand your overall risk for other conditions. How is this treated? This condition is treated by making healthy lifestyle changes, such as eating healthy foods, exercising more, and reducing your alcohol intake. You may be referred for counseling on a healthy diet and physical activity. Your health care provider may prescribe medicine if lifestyle changes are not enough to get your blood pressure under control and if: Your systolic blood pressure is above 130. Your diastolic blood pressure is above 80. Your personal target blood pressure may vary depending on your medical conditions, your age, and other factors. Follow these instructions at home: Eating and drinking  Eat a diet that is high in fiber and potassium, and low in sodium, added sugar, and fat. An example of this eating plan is called the DASH diet. DASH stands for Dietary Approaches to Stop Hypertension. To eat this way: Eat   plenty of fresh fruits and vegetables. Try to fill one half of your plate at each meal with fruits and vegetables. Eat whole grains, such as whole-wheat pasta, brown rice, or whole-grain bread. Fill about one  fourth of your plate with whole grains. Eat or drink low-fat dairy products, such as skim milk or low-fat yogurt. Avoid fatty cuts of meat, processed or cured meats, and poultry with skin. Fill about one fourth of your plate with lean proteins, such as fish, chicken without skin, beans, eggs, or tofu. Avoid pre-made and processed foods. These tend to be higher in sodium, added sugar, and fat. Reduce your daily sodium intake. Many people with hypertension should eat less than 1,500 mg of sodium a day. Do not drink alcohol if: Your health care provider tells you not to drink. You are pregnant, may be pregnant, or are planning to become pregnant. If you drink alcohol: Limit how much you have to: 0-1 drink a day for women. 0-2 drinks a day for men. Know how much alcohol is in your drink. In the [place] one drink equals one 12 oz bottle of beer (355 mL), one 5 oz glass of wine (148 mL), or one 1 oz glass of hard liquor (44 mL). Lifestyle  Work with your health care provider to maintain a healthy body weight or to lose weight. Ask what an ideal weight is for you. Get at least 30 minutes of exercise that causes your heart to beat faster (aerobic exercise) most days of the week. Activities may include walking, swimming, or biking. Include exercise to strengthen your muscles (resistance exercise), such as Pilates or lifting weights, as part of your weekly exercise routine. Try to do these types of exercises for 30 minutes at least 3 days a week. Do not use any products that contain nicotine or tobacco. These products include cigarettes, chewing tobacco, and vaping devices, such as e-cigarettes. If you need help quitting, ask your health care provider. Monitor your blood pressure at home as told by your health care provider. Keep all follow-up visits. This is important. Medicines Take over-the-counter and prescription medicines only as told by your health care provider. Follow directions carefully. Blood  pressure medicines must be taken as prescribed. Do not skip doses of blood pressure medicine. Doing this puts you at risk for problems and can make the medicine less effective. Ask your health care provider about side effects or reactions to medicines that you should watch for. Contact a health care provider if you: Think you are having a reaction to a medicine you are taking. Have headaches that keep coming back (recurring). Feel dizzy. Have swelling in your ankles. Have trouble with your vision. Get help right away if you: Develop a severe headache or confusion. Have unusual weakness or numbness. Feel faint. Have severe pain in your chest or abdomen. Vomit repeatedly. Have trouble breathing. These symptoms may be an emergency. Get help right away. Call 911. Do not wait to see if the symptoms will go away. Do not drive yourself to the hospital. Summary Hypertension is when the force of blood pumping through your arteries is too strong. If this condition is not controlled, it may put you at risk for serious complications. Your personal target blood pressure may vary depending on your medical conditions, your age, and other factors. For most people, a normal blood pressure is less than 120/80. Hypertension is treated with lifestyle changes, medicines, or a combination of both. Lifestyle changes include losing weight, eating a healthy,   low-sodium diet, exercising more, and limiting alcohol. This information is not intended to replace advice given to you by your health care provider. Make sure you discuss any questions you have with your health care provider. Document Revised: 01/05/2021 Document Reviewed: 01/05/2021 Elsevier Patient Education  2023 Elsevier Inc.  

## 2022-07-29 LAB — HM DIABETES EYE EXAM

## 2022-07-30 MED ORDER — DEXCOM G6 SENSOR MISC
1.0000 | Freq: Every day | 5 refills | Status: DC
Start: 2022-07-30 — End: 2023-08-19

## 2022-07-30 MED ORDER — METFORMIN HCL ER 750 MG PO TB24: 1500.0000 mg | ORAL_TABLET | Freq: Every day | ORAL | 1 refills | Status: DC

## 2022-07-30 MED ORDER — LANTUS SOLOSTAR 100 UNIT/ML ~~LOC~~ SOPN: 20.0000 [IU] | PEN_INJECTOR | Freq: Every day | SUBCUTANEOUS | 1 refills | Status: DC

## 2022-07-30 MED ORDER — EMPAGLIFLOZIN 25 MG PO TABS
25.0000 mg | ORAL_TABLET | Freq: Every day | ORAL | 1 refills | Status: DC
Start: 2022-07-30 — End: 2022-10-29

## 2022-07-30 MED ORDER — DEXCOM G6 RECEIVER DEVI: 1.0000 | Freq: Every day | 5 refills | Status: DC

## 2022-07-30 MED ORDER — RYBELSUS 14 MG PO TABS
14.0000 mg | ORAL_TABLET | Freq: Every day | ORAL | 1 refills | Status: DC
Start: 2022-07-30 — End: 2023-02-03

## 2022-08-01 ENCOUNTER — Encounter: Payer: Self-pay | Admitting: Internal Medicine

## 2022-08-02 ENCOUNTER — Encounter: Payer: Self-pay | Admitting: Internal Medicine

## 2022-08-03 ENCOUNTER — Other Ambulatory Visit: Payer: Self-pay | Admitting: Internal Medicine

## 2022-08-03 ENCOUNTER — Telehealth: Payer: Self-pay | Admitting: Internal Medicine

## 2022-08-03 DIAGNOSIS — I10 Essential (primary) hypertension: Secondary | ICD-10-CM

## 2022-08-03 DIAGNOSIS — Z794 Long term (current) use of insulin: Secondary | ICD-10-CM

## 2022-08-03 DIAGNOSIS — E119 Type 2 diabetes mellitus without complications: Secondary | ICD-10-CM

## 2022-08-03 DIAGNOSIS — E118 Type 2 diabetes mellitus with unspecified complications: Secondary | ICD-10-CM

## 2022-08-03 MED ORDER — DEXCOM G6 TRANSMITTER MISC: 1.0000 | Freq: Every day | 5 refills | Status: DC

## 2022-08-03 NOTE — Telephone Encounter (Signed)
Patient's pharmacy called and said they received the prescriptions for the [facility] receiver and sensor, but not the transmitter. They would like to know if that can be sent in to them at [facility] [place] (New Address) - [place], [place] - [address] AT Previously: [address], [place].

## 2022-08-05 NOTE — Addendum Note (Signed)
Addended by: Darryll Capers on: 08/05/2022 02:50 PM   Modules accepted: Orders

## 2022-10-18 ENCOUNTER — Other Ambulatory Visit: Payer: Self-pay | Admitting: Internal Medicine

## 2022-10-18 DIAGNOSIS — E785 Hyperlipidemia, unspecified: Secondary | ICD-10-CM

## 2022-10-29 ENCOUNTER — Other Ambulatory Visit: Payer: Self-pay | Admitting: Internal Medicine

## 2022-10-29 DIAGNOSIS — Z794 Long term (current) use of insulin: Secondary | ICD-10-CM

## 2022-11-07 ENCOUNTER — Encounter: Payer: Self-pay | Admitting: Internal Medicine

## 2022-11-08 ENCOUNTER — Telehealth: Payer: Medicare Other | Admitting: Family Medicine

## 2022-11-08 DIAGNOSIS — U071 COVID-19: Secondary | ICD-10-CM

## 2022-11-08 MED ORDER — NIRMATRELVIR/RITONAVIR (PAXLOVID)TABLET: 3.0000 | ORAL_TABLET | Freq: Two times a day (BID) | ORAL | 0 refills | Status: AC

## 2022-11-08 MED ORDER — BENZONATATE 100 MG PO CAPS: ORAL_CAPSULE | ORAL | 0 refills | Status: DC

## 2022-11-08 NOTE — Progress Notes (Signed)
Virtual Visit via Video Note  I connected with Gene  on 11/08/22 at  6:20 PM EDT by a video enabled telemedicine application and verified that I am speaking with the correct person using two identifiers.  Location patient:  Location provider:work or home office Persons participating in the virtual visit: patient, provider  I discussed the limitations and requested verbal permission for telemedicine visit. The patient expressed understanding and agreed to proceed.   HPI:  Acute telemedicine visit for Covid19: -Onset: 2 days, tested positive for Covid -wife has covid as well -Symptoms include: low grade temp, cough, decreased appetite -Denies:CP, SOB, vomiting, diarrhea -Pertinent past medical history: see below, GFR 76 in may -Pertinent medication allergies:No Known Allergies -COVID-19 vaccine status: last dose in 2022 Immunization History  Administered Date(s) Administered   Fluad Quad(high Dose 65+) 11/21/2018, 01/24/2022   Influenza Whole 12/28/2011, 01/03/2013   Influenza, High Dose Seasonal PF 02/08/2017, 12/04/2017   Influenza,inj,Quad PF,6+ Mos 11/25/2014   Influenza-Unspecified 11/13/2019, 12/08/2020   Moderna SARS-COV2 Booster Vaccination 12/08/2020   PFIZER(Purple Top)SARS-COV-2 Vaccination 05/10/2019, 06/04/2019, 01/21/2020   Pneumococcal Conjugate-13 08/21/2014   Pneumococcal Polysaccharide-23 01/08/2013, 05/16/2018   Td 01/12/2010   Tdap 05/13/2020   Zoster Recombinant(Shingrix) 03/21/2022   Zoster, Live 08/21/2014     ROS: See pertinent positives and negatives per HPI.  Past Medical History:  Diagnosis Date   Cancer [facility])    Prostate - Dr Laverle Patter    Depression    Diabetes mellitus without complication (HCC)    Hyperlipidemia    Hypertension     Past Surgical History:  Procedure Laterality Date   PROSTATE SURGERY       Current Outpatient Medications:    benzonatate (TESSALON PERLES) 100 MG capsule, 1-2 capsules up to twice daily as needed for  cough., Disp: 30 capsule, Rfl: 0   nirmatrelvir/ritonavir (PAXLOVID) 20 x 150 MG & 10 x 100MG  TABS, Take 3 tablets by mouth 2 (two) times daily for 5 days. (Take nirmatrelvir 150 mg two tablets twice daily for 5 days and ritonavir 100 mg one tablet twice daily for 5 days) Patient GFR is > 60, Disp: 30 tablet, Rfl: 0   Continuous Glucose Receiver (DEXCOM G6 RECEIVER) DEVI, 1 Act by Does not apply route daily., Disp: 1 each, Rfl: 5   Continuous Glucose Sensor (DEXCOM G6 SENSOR) MISC, 1 Act by Does not apply route daily., Disp: 1 each, Rfl: 5   Continuous Glucose Transmitter (DEXCOM G6 TRANSMITTER) MISC, 1 Act by Does not apply route daily., Disp: 1 each, Rfl: 5   insulin glargine (LANTUS SOLOSTAR) 100 UNIT/ML Solostar Pen, Inject 20 Units into the skin daily., Disp: 18 mL, Rfl: 1   Insulin Pen Needle (B-D ULTRAFINE III SHORT PEN) 31G X 8 MM MISC, USE DAILY WITH INSULIN, Disp: 100 each, Rfl: 1   JARDIANCE 25 MG TABS tablet, TAKE 1 TABLET (25 MG TOTAL) BY MOUTH DAILY., Disp: 90 tablet, Rfl: 0   metFORMIN (GLUCOPHAGE-XR) 750 MG 24 hr tablet, Take 2 tablets (1,500 mg total) by mouth daily with breakfast., Disp: 180 tablet, Rfl: 1   rosuvastatin (CRESTOR) 10 MG tablet, TAKE 1 TABLET BY MOUTH EVERY DAY, Disp: 90 tablet, Rfl: 0   Semaglutide (RYBELSUS) 14 MG TABS, Take 1 tablet (14 mg total) by mouth daily., Disp: 90 tablet, Rfl: 1   telmisartan (MICARDIS) 40 MG tablet, TAKE 1 TABLET BY MOUTH EVERY DAY, Disp: 90 tablet, Rfl: 1  EXAM:  VITALS per patient if applicable:  GENERAL: alert, oriented, appears well  and in no acute distress  PSYCH/NEURO: pleasant and cooperative, no obvious depression or anxiety, speech and thought processing grossly intact  ASSESSMENT AND PLAN:  Discussed the following assessment and plan:  COVID-19  -we discussed limitations of telemedicine visit vs in person visit, treatment, treatment risks and precautions. Pt is agreeable to treatment via telemedicine at this moment.  Patient opted for treatment with Paxlovid and Tessalon after discussion risks/ interactions/benefit. He agrees to hold statin until 3 days after finishing the paxlovid. Discussed likely contagious period and precautions.   Advised to seek prompt virtual visit or in person care if worsening, new symptoms arise, or if is not improving with treatment as expected per our conversation of expected course. Discussed options for follow up care. Did let this patient know that I do telemedicine on Tuesdays and Thursdays for [place] and those are the days I am logged into the system. Advised to schedule follow up visit with PCP, [facility] virtual visits or [facility] if any further questions or concerns to avoid delays in care.   I discussed the assessment and treatment plan with the patient. The patient was provided an opportunity to ask questions and all were answered. The patient agreed with the plan and demonstrated an understanding of the instructions.     Terressa Koyanagi, DO

## 2022-11-08 NOTE — Patient Instructions (Signed)
HOME CARE TIPS:    -I sent the medication(s) we discussed to your pharmacy: Meds ordered this encounter  Medications   nirmatrelvir/ritonavir (PAXLOVID) 20 x 150 MG & 10 x 100MG  TABS    Sig: Take 3 tablets by mouth 2 (two) times daily for 5 days. (Take nirmatrelvir 150 mg two tablets twice daily for 5 days and ritonavir 100 mg one tablet twice daily for 5 days) Patient GFR is > 60    Dispense:  30 tablet    Refill:  0   benzonatate (TESSALON PERLES) 100 MG capsule    Sig: 1-2 capsules up to twice daily as needed for cough.    Dispense:  30 capsule    Refill:  0     -I sent in the Covid19 treatment or referral you requested per our discussion. Please see the information provided below and discuss further with the pharmacist/treatment team.  -If taking Paxlovid, please review all medications, supplement and over the counter drugs with your pharmacist and ask them to check for any interactions. Please make the following changes to your regular medications while taking Paxlovid: *HOLD your cholesterol medication, rosuvastatin(Crestor) while on Paxlovid for 8 days, restart 3 days after finishing Paxlovid  -there is a chance of rebound illness with covid after improving. This can happen whether or not you take an antiviral treatment. If you become sick again with covid after getting better, please schedule a follow up virtual visit and isolate again.  -can use tylenol if needed for fevers, aches and pains per instructions  -nasal saline sinus rinses twice daily  -stay hydrated, drink plenty of fluids and eat small healthy meals - avoid dairy  -follow up with your doctor in 2-3 days unless improving and feeling better  -stay home while sick, except to seek medical care. If you have COVID19, you will likely be contagious for 7-10 days. Flu or Influenza is likely contagious for about 7 days. Other respiratory viral infections remain contagious for 5-10+ days depending on the virus and many  other factors. Wear a good mask that fits snugly (such as N95 or KN95) if around others to reduce the risk of transmission.  It was nice to meet you today, and I really hope you are feeling better soon. I help [facility] out with telemedicine visits on Tuesdays and Thursdays and am happy to help if you need a follow up virtual visit on those days. Otherwise, if you have any concerns or questions following this visit please schedule a follow up visit with your Primary Care doctor or seek care at a local urgent care clinic to avoid delays in care.    Seek in person care or schedule a follow up video visit promptly if your symptoms worsen, new concerns arise or you are not improving with treatment. Call 911 and/or seek emergency care if your symptoms are severe or life threatening.  Nirmatrelvir; Ritonavir Tablets What is this medication? NIRMATRELVIR; RITONAVIR (NIR ma TREL vir; ri TOE na veer) treats mild to moderate COVID-19. It may help people who are at high risk of developing severe illness. It works by limiting the spread of the virus in your body. This medicine may be used for other purposes; ask your health care provider or pharmacist if you have questions. COMMON BRAND NAME(S): PAXLOVID What should I tell my care team before I take this medication? They need to know if you have any of these conditions: Any allergies Any serious illness Kidney disease Liver disease An unusual  or allergic reaction to nirmatrelvir, ritonavir, other medications, foods, dyes, or preservatives Pregnant or trying to get pregnant Breast-feeding How should I use this medication? This product contains 2 different medications that are packaged together. For the standard dose, take 2 pink tablets of nirmatrelvir with 1 white tablet of ritonavir (3 tablets total) by mouth with water twice daily. Talk to your care team if you have kidney disease. You may need a different dose. Swallow the tablets whole. You can take  it with or without food. If it upsets your stomach, take it with food. Take all of this medication unless your care team tells you to stop it early. Keep taking it even if you think you are better. Talk to your care team about the use of this medication in children. While it may be prescribed for children as young as 12 years for selected conditions, precautions do apply. Overdosage: If you think you have taken too much of this medicine contact a poison control center or emergency room at once. NOTE: This medicine is only for you. Do not share this medicine with others. What if I miss a dose? If you miss a dose, take it as soon as you can unless it is more than 8 hours late. If it is more than 8 hours late, skip the missed dose. Take the next dose at the normal time. Do not take extra or 2 doses at the same time to make up for the missed dose. What may interact with this medication? Do not take this medication with any of the following: Alfuzosin Certain medications for anxiety or sleep, such as midazolam or triazolam Certain medications for cancer, such as apalutamide Certain medications for cholesterol, such as lovastatin or simvastatin Certain medications for irregular heartbeat, such as amiodarone, dronedarone, flecainide, propafenone, quinidine Certain medications for mental health conditions, such as lurasidone or pimozide Certain medications for seizures, such as carbamazepine, phenobarbital, phenytoin, primidone Colchicine Eletriptan Eplerenone Ergot alkaloids, such as dihydroergotamine, ergotamine, methylergonovine Finerenone Flibanserin Ivabradine Lomitapide Lumacaftor; ivacaftor Naloxegol Ranolazine Red Yeast Rice Rifampin Rifapentine Sildenafil Silodosin [facility] John's wort Tolvaptan Ubrogepant Voclosporin This medication may affect how other medications work, and other medications may affect the way this medication works. Talk with your care team about all of the  medications you take. They may suggest changes to your treatment plan to lower the risk of side effects and to make sure your medications work as intended. This list may not describe all possible interactions. Give your health care provider a list of all the medicines, herbs, non-prescription drugs, or dietary supplements you use. Also tell them if you smoke, drink alcohol, or use illegal drugs. Some items may interact with your medicine. What should I watch for while using this medication? Your condition will be monitored carefully while you are receiving this medication. Visit your care team for regular checkups. Tell your care team if your symptoms do not start to get better or if they get worse. If you have untreated HIV infection, this medication may lead to some HIV medications not working as well in the future. Estrogen and progestin hormones may not work as well while you are taking this medication. Your care team can help you find the contraceptive option that works for you. What side effects may I notice from receiving this medication? Side effects that you should report to your care team as soon as possible: Allergic reactions--skin rash, itching, hives, swelling of the face, lips, tongue, or throat Liver injury--right upper belly  pain, loss of appetite, nausea, light-colored stool, dark yellow or brown urine, yellowing skin or eyes, unusual weakness or fatigue Redness, blistering, peeling, or loosening of the skin, including inside the mouth Side effects that usually do not require medical attention (report these to your care team if they continue or are bothersome): Change in taste Diarrhea General discomfort and fatigue Increase in blood pressure Muscle pain Nausea Stomach pain This list may not describe all possible side effects. Call your doctor for medical advice about side effects. You may report side effects to FDA at 1-800-FDA-1088. Where should I keep my medication? Keep out  of the reach of children and pets. Store at room temperature between 20 and 25 degrees C (68 and 77 degrees F). Get rid of any unused medication after the expiration date. To get rid of medications that are no longer needed or have expired: Take the medication to a medication take-back program. Check with your pharmacy or law enforcement to find a location. If you cannot return the medication, check the label or package insert to see if the medication should be thrown out in the garbage or flushed down the toilet. If you are not sure, ask your care team. If it is safe to put it in the trash, take the medication out of the container. Mix the medication with cat litter, dirt, coffee grounds, or other unwanted substance. Seal the mixture in a bag or container. Put it in the trash. NOTE: This sheet is a summary. It may not cover all possible information. If you have questions about this medicine, talk to your doctor, pharmacist, or health care provider.  2024 Elsevier/Gold Standard (2022-04-18 00:00:00)

## 2022-12-06 ENCOUNTER — Other Ambulatory Visit: Payer: Self-pay | Admitting: Internal Medicine

## 2022-12-06 DIAGNOSIS — E119 Type 2 diabetes mellitus without complications: Secondary | ICD-10-CM

## 2022-12-26 ENCOUNTER — Other Ambulatory Visit: Payer: Self-pay | Admitting: Internal Medicine

## 2022-12-26 DIAGNOSIS — E118 Type 2 diabetes mellitus with unspecified complications: Secondary | ICD-10-CM

## 2023-01-05 ENCOUNTER — Other Ambulatory Visit: Payer: Self-pay | Admitting: Internal Medicine

## 2023-01-05 DIAGNOSIS — E118 Type 2 diabetes mellitus with unspecified complications: Secondary | ICD-10-CM

## 2023-01-18 ENCOUNTER — Other Ambulatory Visit: Payer: Self-pay | Admitting: Internal Medicine

## 2023-01-18 DIAGNOSIS — E785 Hyperlipidemia, unspecified: Secondary | ICD-10-CM

## 2023-01-21 ENCOUNTER — Other Ambulatory Visit: Payer: Self-pay | Admitting: Internal Medicine

## 2023-01-21 DIAGNOSIS — I10 Essential (primary) hypertension: Secondary | ICD-10-CM

## 2023-01-21 DIAGNOSIS — E118 Type 2 diabetes mellitus with unspecified complications: Secondary | ICD-10-CM

## 2023-02-02 ENCOUNTER — Encounter: Payer: Self-pay | Admitting: Internal Medicine

## 2023-02-02 ENCOUNTER — Ambulatory Visit: Payer: Medicare Other | Admitting: Internal Medicine

## 2023-02-02 VITALS — BP 140/82 | HR 84 | Temp 98.0°F | Resp 16 | Ht 70.0 in | Wt 169.4 lb

## 2023-02-02 DIAGNOSIS — E118 Type 2 diabetes mellitus with unspecified complications: Secondary | ICD-10-CM | POA: Diagnosis not present

## 2023-02-02 DIAGNOSIS — E785 Hyperlipidemia, unspecified: Secondary | ICD-10-CM

## 2023-02-02 DIAGNOSIS — Z794 Long term (current) use of insulin: Secondary | ICD-10-CM

## 2023-02-02 DIAGNOSIS — Z0000 Encounter for general adult medical examination without abnormal findings: Secondary | ICD-10-CM | POA: Diagnosis not present

## 2023-02-02 DIAGNOSIS — I10 Essential (primary) hypertension: Secondary | ICD-10-CM | POA: Diagnosis not present

## 2023-02-02 DIAGNOSIS — E039 Hypothyroidism, unspecified: Secondary | ICD-10-CM | POA: Diagnosis not present

## 2023-02-02 DIAGNOSIS — Z0001 Encounter for general adult medical examination with abnormal findings: Secondary | ICD-10-CM

## 2023-02-02 DIAGNOSIS — Z23 Encounter for immunization: Secondary | ICD-10-CM

## 2023-02-02 LAB — CBC WITH DIFFERENTIAL/PLATELET
Basophils Absolute: 0.1 10*3/uL (ref 0.0–0.1)
Basophils Relative: 1 % (ref 0.0–3.0)
Eosinophils Absolute: 0.1 10*3/uL (ref 0.0–0.7)
Eosinophils Relative: 2.1 % (ref 0.0–5.0)
HCT: 47.7 % (ref 39.0–52.0)
Hemoglobin: 15.5 g/dL (ref 13.0–17.0)
Lymphocytes Relative: 20.8 % (ref 12.0–46.0)
Lymphs Abs: 1.1 10*3/uL (ref 0.7–4.0)
MCHC: 32.5 g/dL (ref 30.0–36.0)
MCV: 92.1 fL (ref 78.0–100.0)
Monocytes Absolute: 0.4 10*3/uL (ref 0.1–1.0)
Monocytes Relative: 7.2 % (ref 3.0–12.0)
Neutro Abs: 3.5 10*3/uL (ref 1.4–7.7)
Neutrophils Relative %: 68.9 % (ref 43.0–77.0)
Platelets: 202 10*3/uL (ref 150.0–400.0)
RBC: 5.17 Mil/uL (ref 4.22–5.81)
RDW: 13.2 % (ref 11.5–15.5)
WBC: 5.1 10*3/uL (ref 4.0–10.5)

## 2023-02-02 LAB — URINALYSIS, ROUTINE W REFLEX MICROSCOPIC
Bilirubin Urine: NEGATIVE
Hgb urine dipstick: NEGATIVE
Ketones, ur: NEGATIVE
Leukocytes,Ua: NEGATIVE
Nitrite: NEGATIVE
RBC / HPF: NONE SEEN (ref 0–?)
Specific Gravity, Urine: 1.02 (ref 1.000–1.030)
Total Protein, Urine: NEGATIVE
Urine Glucose: >=1000 — AB
Urobilinogen, UA: 0.2 (ref 0.0–1.0)
WBC, UA: NONE SEEN (ref 0–?)
pH: 6 (ref 5.0–8.0)

## 2023-02-02 LAB — HEPATIC FUNCTION PANEL
ALT: 25 U/L (ref 0–53)
AST: 22 U/L (ref 0–37)
Albumin: 4.3 g/dL (ref 3.5–5.2)
Alkaline Phosphatase: 66 U/L (ref 39–117)
Bilirubin, Direct: 0.2 mg/dL (ref 0.0–0.3)
Total Bilirubin: 0.7 mg/dL (ref 0.2–1.2)
Total Protein: 6.9 g/dL (ref 6.0–8.3)

## 2023-02-02 LAB — BASIC METABOLIC PANEL
BUN: 17 mg/dL (ref 6–23)
CO2: 26 meq/L (ref 19–32)
Calcium: 9.6 mg/dL (ref 8.4–10.5)
Chloride: 104 meq/L (ref 96–112)
Creatinine, Ser: 1.02 mg/dL (ref 0.40–1.50)
GFR: 73.52 mL/min (ref >60.00)
Glucose, Bld: 100 mg/dL — ABNORMAL HIGH (ref 70–99)
Potassium: 3.9 meq/L (ref 3.5–5.1)
Sodium: 138 meq/L (ref 135–145)

## 2023-02-02 LAB — MICROALBUMIN / CREATININE URINE RATIO
Creatinine,U: 46.9 mg/dL
Microalb Creat Ratio: 1.6 mg/g (ref 0.0–30.0)
Microalb, Ur: 0.8 mg/dL (ref 0.0–1.9)

## 2023-02-02 LAB — HEMOGLOBIN A1C: Hgb A1c MFr Bld: 8.2 % — ABNORMAL HIGH (ref 4.6–6.5)

## 2023-02-02 LAB — TSH: TSH: 5.34 u[IU]/mL (ref 0.35–5.50)

## 2023-02-02 NOTE — Patient Instructions (Signed)
Health Maintenance, Male Adopting a healthy lifestyle and getting preventive care are important in promoting health and wellness. Ask your health care provider about: The right schedule for you to have regular tests and exams. Things you can do on your own to prevent diseases and keep yourself healthy. What should I know about diet, weight, and exercise? Eat a healthy diet  Eat a diet that includes plenty of vegetables, fruits, low-fat dairy products, and lean protein. Do not eat a lot of foods that are high in solid fats, added sugars, or sodium. Maintain a healthy weight Body mass index (BMI) is a measurement that can be used to identify possible weight problems. It estimates body fat based on height and weight. Your health care provider can help determine your BMI and help you achieve or maintain a healthy weight. Get regular exercise Get regular exercise. This is one of the most important things you can do for your health. Most adults should: Exercise for at least 150 minutes each week. The exercise should increase your heart rate and make you sweat (moderate-intensity exercise). Do strengthening exercises at least twice a week. This is in addition to the moderate-intensity exercise. Spend less time sitting. Even light physical activity can be beneficial. Watch cholesterol and blood lipids Have your blood tested for lipids and cholesterol at 72 years of age, then have this test every 5 years. You may need to have your cholesterol levels checked more often if: Your lipid or cholesterol levels are high. You are older than 72 years of age. You are at high risk for heart disease. What should I know about cancer screening? Many types of cancers can be detected early and may often be prevented. Depending on your health history and family history, you may need to have cancer screening at various ages. This may include screening for: Colorectal cancer. Prostate cancer. Skin cancer. Lung  cancer. What should I know about heart disease, diabetes, and high blood pressure? Blood pressure and heart disease High blood pressure causes heart disease and increases the risk of stroke. This is more likely to develop in people who have high blood pressure readings or are overweight. Talk with your health care provider about your target blood pressure readings. Have your blood pressure checked: Every 3-5 years if you are 18-39 years of age. Every year if you are 40 years old or older. If you are between the ages of 65 and 75 and are a current or former smoker, ask your health care provider if you should have a one-time screening for abdominal aortic aneurysm (AAA). Diabetes Have regular diabetes screenings. This checks your fasting blood sugar level. Have the screening done: Once every three years after age 45 if you are at a normal weight and have a low risk for diabetes. More often and at a younger age if you are overweight or have a high risk for diabetes. What should I know about preventing infection? Hepatitis B If you have a higher risk for hepatitis B, you should be screened for this virus. Talk with your health care provider to find out if you are at risk for hepatitis B infection. Hepatitis C Blood testing is recommended for: Everyone born from 1945 through 1965. Anyone with known risk factors for hepatitis C. Sexually transmitted infections (STIs) You should be screened each year for STIs, including gonorrhea and chlamydia, if: You are sexually active and are younger than 72 years of age. You are older than 72 years of age and your   health care provider tells you that you are at risk for this type of infection. Your sexual activity has changed since you were last screened, and you are at increased risk for chlamydia or gonorrhea. Ask your health care provider if you are at risk. Ask your health care provider about whether you are at high risk for HIV. Your health care provider  may recommend a prescription medicine to help prevent HIV infection. If you choose to take medicine to prevent HIV, you should first get tested for HIV. You should then be tested every 3 months for as long as you are taking the medicine. Follow these instructions at home: Alcohol use Do not drink alcohol if your health care provider tells you not to drink. If you drink alcohol: Limit how much you have to 0-2 drinks a day. Know how much alcohol is in your drink. In the [place] one drink equals one 12 oz bottle of beer (355 mL), one 5 oz glass of wine (148 mL), or one 1 oz glass of hard liquor (44 mL). Lifestyle Do not use any products that contain nicotine or tobacco. These products include cigarettes, chewing tobacco, and vaping devices, such as e-cigarettes. If you need help quitting, ask your health care provider. Do not use street drugs. Do not share needles. Ask your health care provider for help if you need support or information about quitting drugs. General instructions Schedule regular health, dental, and eye exams. Stay current with your vaccines. Tell your health care provider if: You often feel depressed. You have ever been abused or do not feel safe at home. Summary Adopting a healthy lifestyle and getting preventive care are important in promoting health and wellness. Follow your health care provider's instructions about healthy diet, exercising, and getting tested or screened for diseases. Follow your health care provider's instructions on monitoring your cholesterol and blood pressure. This information is not intended to replace advice given to you by your health care provider. Make sure you discuss any questions you have with your health care provider. Document Revised: 07/20/2020 Document Reviewed: 07/20/2020 Elsevier Patient Education  2024 [employer]  

## 2023-02-02 NOTE — Progress Notes (Signed)
Subjective:  Patient ID: Clifford Brown, male    DOB: June 21, 1950  Age: 72 y.o. MRN: [id number]  CC: Annual Exam, Hypertension, Hyperlipidemia, and Diabetes   HPI Clifford Brown presents for a CPX and f/up ----  Discussed the use of AI scribe software for clinical note transcription with the patient, who gave verbal consent to proceed.  History of Present Illness   The patient, with a history of diabetes, presents for a routine follow-up. He reports maintaining an active lifestyle, working a few days a week and engaging in regular walking. He denies any chest pain, shortness of breath, dizziness, lightheadedness, or irregular heartbeat. He also denies any symptoms related to his blood sugar, such as excessive thirst or urination, or drastic changes in weight or appetite. His blood sugar at the time of the visit was 125, which he perceives as satisfactory.  The patient admits to regular consumption of craft beers and a diet that may be excessive.   The patient also works in a cooler environment and occasionally experiences twinges in his back, which he attributes to the cold. He denies any pain in his calves when walking.       Outpatient Medications Prior to Visit  Medication Sig Dispense Refill   B-D ULTRAFINE III SHORT PEN 31G X 8 MM MISC USE DAILY WITH INSULIN 100 each 1   Continuous Glucose Receiver (DEXCOM G6 RECEIVER) DEVI 1 Act by Does not apply route daily. 1 each 5   Continuous Glucose Sensor (DEXCOM G6 SENSOR) MISC 1 Act by Does not apply route daily. 1 each 5   Continuous Glucose Transmitter (DEXCOM G6 TRANSMITTER) MISC 1 Act by Does not apply route daily. 1 each 5   insulin glargine (LANTUS SOLOSTAR) 100 UNIT/ML Solostar Pen INJECT 20 UNITS INTO THE SKIN DAILY 18 mL 0   JARDIANCE 25 MG TABS tablet TAKE 1 TABLET (25 MG TOTAL) BY MOUTH DAILY. 90 tablet 0   benzonatate (TESSALON PERLES) 100 MG capsule 1-2 capsules up to twice daily as needed for cough. 30 capsule 0   metFORMIN  (GLUCOPHAGE-XR) 750 MG 24 hr tablet Take 2 tablets (1,500 mg total) by mouth daily with breakfast. 180 tablet 1   rosuvastatin (CRESTOR) 10 MG tablet TAKE 1 TABLET BY MOUTH EVERY DAY 90 tablet 0   Semaglutide (RYBELSUS) 14 MG TABS Take 1 tablet (14 mg total) by mouth daily. 90 tablet 1   telmisartan (MICARDIS) 40 MG tablet TAKE 1 TABLET BY MOUTH EVERY DAY 90 tablet 1   No facility-administered medications prior to visit.    ROS Review of Systems  Constitutional:  Negative for appetite change, chills, diaphoresis, fatigue and fever.  HENT: Negative.    Eyes: Negative.  Negative for visual disturbance.  Respiratory:  Negative for cough, chest tightness, shortness of breath and wheezing.   Cardiovascular:  Negative for chest pain, palpitations and leg swelling.  Gastrointestinal:  Negative for abdominal pain, constipation, diarrhea, nausea and vomiting.  Genitourinary: Negative.  Negative for difficulty urinating and dysuria.  Musculoskeletal:  Negative for arthralgias and myalgias.  Skin: Negative.   Neurological:  Negative for dizziness, weakness and light-headedness.  Hematological:  Negative for adenopathy. Does not bruise/bleed easily.  Psychiatric/Behavioral:  Positive for confusion and decreased concentration. Negative for self-injury. The patient is not nervous/anxious.     Objective:  BP (!) 140/82   Pulse 84   Temp 98 F (36.7 C) (Oral)   Resp 16   Ht 5\' 10"  (1.778 m)   Wt 169  lb 6.4 oz (76.8 kg)   SpO2 97%   BMI 24.31 kg/m   BP Readings from Last 3 Encounters:  02/02/23 (!) 140/82  07/28/22 (!) 142/86  01/24/22 138/86    Wt Readings from Last 3 Encounters:  02/02/23 169 lb 6.4 oz (76.8 kg)  07/28/22 169 lb (76.7 kg)  01/24/22 170 lb (77.1 kg)    Physical Exam Vitals reviewed.  Constitutional:      Appearance: Normal appearance.  HENT:     Mouth/Throat:     Mouth: Mucous membranes are moist.  Eyes:     General: No scleral icterus.    Conjunctiva/sclera:  Conjunctivae normal.  Cardiovascular:     Rate and Rhythm: Normal rate and regular rhythm.     Heart sounds: No murmur heard.    No friction rub. No gallop.  Pulmonary:     Effort: Pulmonary effort is normal.     Breath sounds: No stridor. No wheezing, rhonchi or rales.  Abdominal:     General: Abdomen is flat.     Palpations: There is no mass.     Tenderness: There is no abdominal tenderness. There is no guarding.     Hernia: No hernia is present.  Musculoskeletal:        General: Normal range of motion.     Cervical back: Neck supple.     Right lower leg: No edema.     Left lower leg: No edema.  Lymphadenopathy:     Cervical: No cervical adenopathy.  Skin:    General: Skin is warm and dry.     Coloration: Skin is not pale.  Neurological:     General: No focal deficit present.     Mental Status: He is alert. Mental status is at baseline.  Psychiatric:        Mood and Affect: Mood normal.        Behavior: Behavior normal.     Lab Results  Component Value Date   WBC 5.1 02/02/2023   HGB 15.5 02/02/2023   HCT 47.7 02/02/2023   PLT 202.0 02/02/2023   GLUCOSE 100 (H) 02/02/2023   CHOL 139 07/28/2022   TRIG 75.0 07/28/2022   HDL 75.70 07/28/2022   LDLDIRECT 183.6 01/31/2011   LDLCALC 48 07/28/2022   ALT 25 02/02/2023   AST 22 02/02/2023   NA 138 02/02/2023   K 3.9 02/02/2023   CL 104 02/02/2023   CREATININE 1.02 02/02/2023   BUN 17 02/02/2023   CO2 26 02/02/2023   TSH 5.34 02/02/2023   PSA 0.00 (L) 07/28/2022   HGBA1C 8.2 (H) 02/02/2023   MICROALBUR 0.8 02/02/2023    DG Chest 2 View  Result Date: 04/10/2018 CLINICAL DATA:  Cough EXAM: CHEST - 2 VIEW COMPARISON:  12/03/2008 FINDINGS: Heart and mediastinal contours are within normal limits. No focal opacities or effusions. No acute bony abnormality. IMPRESSION: No active cardiopulmonary disease. Electronically Signed   By: Charlett Nose M.D.   On: 04/10/2018 17:15    Assessment & Plan:   Essential  hypertension, benign- His BP is adequately well controlled. -     Urinalysis, Routine w reflex microscopic; Future -     Basic metabolic panel; Future -     CBC with Differential/Platelet; Future -     Telmisartan; Take 1 tablet (40 mg total) by mouth daily.  Dispense: 90 tablet; Refill: 1  Acquired hypothyroidism- He is euthyroid. -     TSH; Future  Encounter for general adult medical examination with  abnormal findings- Exam completed, labs reviewed, vaccines reviewed and updated, cancer screenings are UTD, pt ed material was given.   Type 2 diabetes mellitus with complication, with long-term current use of insulin (HCC)- His A1c remains stubbornly elevated at 8.2%.  Will continue the current glycemic agents and he will continue working on his lifestyle modifications. -     Microalbumin / creatinine urine ratio; Future -     Urinalysis, Routine w reflex microscopic; Future -     Hemoglobin A1c; Future -     Basic metabolic panel; Future -     HM Diabetes Foot Exam -     Rybelsus; Take 1 tablet (14 mg total) by mouth daily.  Dispense: 90 tablet; Refill: 1 -     Telmisartan; Take 1 tablet (40 mg total) by mouth daily.  Dispense: 90 tablet; Refill: 1 -     metFORMIN HCl ER; Take 2 tablets (1,500 mg total) by mouth daily with breakfast.  Dispense: 180 tablet; Refill: 1  Dyslipidemia (high LDL; low HDL) - LDL goal achieved. Doing well on the statin  -     Hepatic function panel; Future -     Rosuvastatin Calcium; Take 1 tablet (10 mg total) by mouth daily.  Dispense: 90 tablet; Refill: 1  Need for vaccination -     Flu Vaccine Trivalent High Dose (Fluad)     Follow-up: Return in about 6 months (around 08/02/2023).  Sanda Linger, MD

## 2023-02-03 MED ORDER — METFORMIN HCL ER 750 MG PO TB24: 1500.0000 mg | ORAL_TABLET | Freq: Every day | ORAL | 1 refills | Status: DC

## 2023-02-03 MED ORDER — ROSUVASTATIN CALCIUM 10 MG PO TABS: 10.0000 mg | ORAL_TABLET | Freq: Every day | ORAL | 1 refills | Status: DC

## 2023-02-03 MED ORDER — RYBELSUS 14 MG PO TABS
14.0000 mg | ORAL_TABLET | Freq: Every day | ORAL | 1 refills | Status: DC
Start: 2023-02-03 — End: 2023-08-19

## 2023-02-03 MED ORDER — TELMISARTAN 40 MG PO TABS: 40.0000 mg | ORAL_TABLET | Freq: Every day | ORAL | 1 refills | Status: DC

## 2023-02-08 ENCOUNTER — Encounter: Payer: Self-pay | Admitting: Internal Medicine

## 2023-02-26 ENCOUNTER — Other Ambulatory Visit: Payer: Self-pay | Admitting: Internal Medicine

## 2023-02-26 DIAGNOSIS — E119 Type 2 diabetes mellitus without complications: Secondary | ICD-10-CM

## 2023-04-13 ENCOUNTER — Other Ambulatory Visit: Payer: Self-pay | Admitting: Internal Medicine

## 2023-04-13 DIAGNOSIS — Z794 Long term (current) use of insulin: Secondary | ICD-10-CM

## 2023-06-23 ENCOUNTER — Ambulatory Visit

## 2023-06-23 VITALS — Ht 70.0 in | Wt 169.0 lb

## 2023-06-23 DIAGNOSIS — Z0000 Encounter for general adult medical examination without abnormal findings: Secondary | ICD-10-CM | POA: Diagnosis not present

## 2023-06-23 DIAGNOSIS — E118 Type 2 diabetes mellitus with unspecified complications: Secondary | ICD-10-CM

## 2023-06-23 DIAGNOSIS — E119 Type 2 diabetes mellitus without complications: Secondary | ICD-10-CM

## 2023-06-23 DIAGNOSIS — Z794 Long term (current) use of insulin: Secondary | ICD-10-CM

## 2023-06-23 NOTE — Progress Notes (Signed)
 Subjective:  Please attest and cosign this visit due to patients primary care provider not being in the office at the time the visit was completed.  (Pt of Dr Sanda Linger)   Clifford Brown is a 73 y.o. who presents for a Medicare Wellness preventive visit.  Visit Complete: Virtual I connected with  Clifford Brown on 06/23/23 by a audio enabled telemedicine application and verified that I am speaking with the correct person using two identifiers.  Patient Location: Home  Provider Location: Office/Clinic  I discussed the limitations of evaluation and management by telemedicine. The patient expressed understanding and agreed to proceed.  Vital Signs: Because this visit was a virtual/telehealth visit, some criteria may be missing or patient reported. Any vitals not documented were not able to be obtained and vitals that have been documented are patient reported.  VideoDeclined- This patient declined Librarian, academic. Therefore the visit was completed with audio only.  Persons Participating in Visit: Patient.  AWV Questionnaire: No: Patient Medicare AWV questionnaire was not completed prior to this visit.  Cardiac Risk Factors include: advanced age (>90men, >39 women);dyslipidemia;diabetes mellitus;hypertension;male gender     Objective:    Today's Vitals   06/23/23 0915  Weight: 169 lb (76.7 kg)  Height: 5\' 10"  (1.778 m)   Body mass index is 24.25 kg/m.     06/23/2023    9:14 AM 02/14/2022    8:55 AM 02/12/2021    1:55 PM 11/27/2019    8:51 AM 11/21/2018    8:24 AM 10/23/2017    8:32 AM 05/02/2016    4:54 PM  Advanced Directives  Does Patient Have a Medical Advance Directive? No No No Yes No No Yes  Type of Advance Directive    Living will   Healthcare Power of [place];Living will  Does patient want to make changes to medical advance directive?    No - Patient declined     Copy of Healthcare Power of Attorney in Chart?       Yes  Would patient  like information on creating a medical advance directive? Yes (MAU/Ambulatory/Procedural Areas - Information given) No - Patient declined No - Patient declined  No - Patient declined Yes (ED - Information included in AVS)     Current Medications (verified) Outpatient Encounter Medications as of 06/23/2023  Medication Sig   B-D ULTRAFINE III SHORT PEN 31G X 8 MM MISC USE DAILY WITH INSULIN   Continuous Glucose Receiver (DEXCOM G6 RECEIVER) DEVI 1 Act by Does not apply route daily.   Continuous Glucose Sensor (DEXCOM G6 SENSOR) MISC 1 Act by Does not apply route daily.   Continuous Glucose Transmitter (DEXCOM G6 TRANSMITTER) MISC 1 Act by Does not apply route daily.   insulin glargine (LANTUS SOLOSTAR) 100 UNIT/ML Solostar Pen INJECT 20 UNITS INTO THE SKIN DAILY   JARDIANCE 25 MG TABS tablet TAKE 1 TABLET (25 MG TOTAL) BY MOUTH DAILY.   metFORMIN (GLUCOPHAGE-XR) 750 MG 24 hr tablet Take 2 tablets (1,500 mg total) by mouth daily with breakfast.   rosuvastatin (CRESTOR) 10 MG tablet Take 1 tablet (10 mg total) by mouth daily.   Semaglutide (RYBELSUS) 14 MG TABS Take 1 tablet (14 mg total) by mouth daily.   telmisartan (MICARDIS) 40 MG tablet Take 1 tablet (40 mg total) by mouth daily.   No facility-administered encounter medications on file as of 06/23/2023.    Allergies (verified) Patient has no known allergies.   History: Past Medical History:  Diagnosis Date  Cancer [facility])    Prostate - Dr Laverle Patter    Depression    Diabetes mellitus without complication (HCC)    Hyperlipidemia    Hypertension    Past Surgical History:  Procedure Laterality Date   PROSTATE SURGERY     Family History  Problem Relation Age of Onset   Alcohol abuse Other    Cancer Other        Prostate   Stroke Neg Hx    Kidney disease Neg Hx    Hypertension Neg Hx    Hyperlipidemia Neg Hx    Heart disease Neg Hx    Early death Neg Hx    Diabetes Neg Hx    Social History   Socioeconomic History   Marital  status: Married    Spouse name: Not on file   Number of children: 2   Years of education: Not on file   Highest education level: 12th grade  Occupational History   Occupation: [employer] part-time  Tobacco Use   Smoking status: Former    Current packs/day: 0.00    Types: Cigarettes    Quit date: 03/14/1976    Years since quitting: 47.3    Passive exposure: Past   Smokeless tobacco: Never  Vaping Use   Vaping status: Never Used  Substance and Sexual Activity   Alcohol use: Yes    Alcohol/week: 35.0 standard drinks of alcohol    Types: 35 Cans of beer per week    Comment: 2-3 beers per day   Drug use: No   Sexual activity: Yes    Partners: Female  Other Topics Concern   Not on file  Social History Narrative   Married   Former Smoker   Social Drivers of Corporate investment banker Strain: Low Risk  (06/23/2023)   Overall Financial Resource Strain (CARDIA)    Difficulty of Paying Living Expenses: Not hard at all  Food Insecurity: No Food Insecurity (06/23/2023)   Hunger Vital Sign    Worried About Running Out of Food in the Last Year: Never true    Ran Out of Food in the Last Year: Never true  Transportation Needs: No Transportation Needs (06/23/2023)   PRAPARE - Administrator, Civil Service (Medical): No    Lack of Transportation (Non-Medical): No  Physical Activity: Sufficiently Active (06/23/2023)   Exercise Vital Sign    Days of Exercise per Week: 7 days    Minutes of Exercise per Session: 60 min  Recent Concern: Physical Activity - Insufficiently Active (06/19/2023)   Exercise Vital Sign    Days of Exercise per Week: 3 days    Minutes of Exercise per Session: 20 min  Stress: No Stress Concern Present (06/23/2023)   [employer] of Occupational Health - Occupational Stress Questionnaire    Feeling of Stress : Not at all  Social Connections: Socially Integrated (06/23/2023)   Social Connection and Isolation Panel [NHANES]    Frequency of Communication with  Friends and Family: More than three times a week    Frequency of Social Gatherings with Friends and Family: More than three times a week    Attends Religious Services: More than 4 times per year    Active Member of [employer] or Organizations: Yes    Attends Engineer, structural: More than 4 times per year    Marital Status: Married    Tobacco Counseling Counseling given: No    Clinical Intake:  Pre-visit preparation completed: Yes  Pain : No/denies  pain     BMI - recorded: 24.25 Nutritional Risks: None Diabetes: Yes CBG done?: Yes (fasting - 114) CBG resulted in Enter/ Edit results?: Yes (fasting - 114) Did pt. bring in CBG monitor from home?: No  Lab Results  Component Value Date   HGBA1C 8.2 (H) 02/02/2023   HGBA1C 8.0 (H) 07/28/2022   HGBA1C 8.3 (H) 01/24/2022     How often do you need to have someone help you when you read instructions, pamphlets, or other written materials from your doctor or pharmacy?: 1 - Never  Interpreter Needed?: No  Information entered by :: Hassell Halim, CMA   Activities of Daily Living     06/23/2023    9:18 AM  In your present state of health, do you have any difficulty performing the following activities:  Hearing? 0  Vision? 0  Difficulty concentrating or making decisions? 0  Walking or climbing stairs? 0  Dressing or bathing? 0  Doing errands, shopping? 0  Preparing Food and eating ? N  Using the Toilet? N  In the past six months, have you accidently leaked urine? N  Do you have problems with loss of bowel control? N  Managing your Medications? N  Managing your Finances? N  Housekeeping or managing your Housekeeping? N    Patient Care Team: Etta Grandchild, MD as PCP - General (Internal Medicine) Kathyrn Sheriff, [facility] (Inactive) as Pharmacist (Pharmacist) [facility] Addison Od, [place] (Ophthalmology)  Indicate any recent Medical Services you may have received from other than [facility] providers in the past year (date  may be approximate).     Assessment:   This is a routine wellness examination for Daryll.  Hearing/Vision screen Hearing Screening - Comments:: Denies hearing difficulties   Vision Screening - Comments:: Wears eye contact lenses - [facility]   Goals Addressed               This Visit's Progress     Patient Stated (pt-stated)        Patient stated he plans to ride his bike more this year.       Depression Screen     06/23/2023    9:24 AM 07/28/2022    8:59 AM 02/14/2022    8:44 AM 02/12/2021    2:02 PM 02/12/2021    1:54 PM 01/20/2021    3:19 PM 11/27/2019    8:49 AM  PHQ 2/9 Scores  PHQ - 2 Score 0 0 0 0 0 0 0  PHQ- 9 Score 1          Fall Risk     06/23/2023    9:20 AM 07/28/2022    8:59 AM 02/14/2022    8:56 AM 02/12/2021    1:56 PM 01/20/2021    3:18 PM  Fall Risk   Falls in the past year? 0 0 0 0 0  Number falls in past yr: 0 0 0 0   Injury with Fall? 0 0 0 0   Risk for fall due to : No Fall Risks No Fall Risks No Fall Risks    Follow up Falls prevention discussed;Falls evaluation completed Falls evaluation completed Falls evaluation completed Falls evaluation completed     MEDICARE RISK AT HOME:  Medicare Risk at Home Any stairs in or around the home?: Yes If so, are there any without handrails?: No Home free of loose throw rugs in walkways, pet beds, electrical cords, etc?: Yes Adequate lighting in your home to reduce risk  of falls?: Yes Life alert?: No Use of a cane, walker or w/c?: No Grab bars in the bathroom?: No Shower chair or bench in shower?: No Elevated toilet seat or a handicapped toilet?: No  TIMED UP AND GO:  Was the test performed?  No  Cognitive Function: 6CIT completed        06/23/2023    9:21 AM 02/14/2022    8:47 AM 11/27/2019    8:53 AM  6CIT Screen  What Year? 0 points 0 points 0 points  What month? 0 points 0 points 0 points  What time? 0 points 0 points 0 points  Count back from 20 0 points 0 points 0 points  Months  in reverse 0 points 0 points 0 points  Repeat phrase 0 points 0 points 0 points  Total Score 0 points 0 points 0 points    Immunizations Immunization History  Administered Date(s) Administered   Fluad Quad(high Dose 65+) 11/21/2018, 01/24/2022   Fluad Trivalent(High Dose 65+) 02/02/2023   Influenza Whole 12/28/2011, 01/03/2013   Influenza, High Dose Seasonal PF 02/08/2017, 12/04/2017   Influenza,inj,Quad PF,6+ Mos 11/25/2014   Influenza-Unspecified 11/13/2019, 12/08/2020   Moderna SARS-COV2 Booster Vaccination 12/08/2020   PFIZER(Purple Top)SARS-COV-2 Vaccination 05/10/2019, 06/04/2019, 01/21/2020   Pneumococcal Conjugate-13 08/21/2014   Pneumococcal Polysaccharide-23 01/08/2013, 05/16/2018   Td 01/12/2010   Tdap 05/13/2020   Zoster Recombinant(Shingrix) 12/20/2021, 03/21/2022, 05/21/2022   Zoster, Live 08/21/2014    Screening Tests Health Maintenance  Topic Date Due   COVID-19 Vaccine (5 - 2024-25 season) 11/13/2022   OPHTHALMOLOGY EXAM  07/29/2023   HEMOGLOBIN A1C  08/02/2023   INFLUENZA VACCINE  10/13/2023   Diabetic kidney evaluation - eGFR measurement  02/02/2024   Diabetic kidney evaluation - Urine ACR  02/02/2024   FOOT EXAM  02/02/2024   Fecal DNA (Cologuard)  06/03/2024   Medicare Annual Wellness (AWV)  06/22/2024   DTaP/Tdap/Td (3 - Td or Tdap) 05/14/2030   Pneumonia Vaccine 70+ Years old  Completed   Hepatitis C Screening  Completed   Zoster Vaccines- Shingrix  Completed   HPV VACCINES  Aged Out   Meningococcal B Vaccine  Aged Out    Health Maintenance  Health Maintenance Due  Topic Date Due   COVID-19 Vaccine (5 - 2024-25 season) 11/13/2022   Health Maintenance Items Addressed: 06/23/2023   Additional Screening:  Vision Screening: Recommended annual ophthalmology exams for early detection of glaucoma and other disorders of the eye.  Dental Screening: Recommended annual dental exams for proper oral hygiene  Community Resource Referral / Chronic  Care Management: CRR required this visit?  No   CCM required this visit?  No     Plan:     I have personally reviewed and noted the following in the patient's chart:   Medical and social history Use of alcohol, tobacco or illicit drugs  Current medications and supplements including opioid prescriptions. Patient is not currently taking opioid prescriptions. Functional ability and status Nutritional status Physical activity Advanced directives List of other physicians Hospitalizations, surgeries, and ER visits in previous 12 months Vitals Screenings to include cognitive, depression, and falls Referrals and appointments  In addition, I have reviewed and discussed with patient certain preventive protocols, quality metrics, and best practice recommendations. A written personalized care plan for preventive services as well as general preventive health recommendations were provided to patient.     Darreld Mclean, CMA   06/23/2023   After Visit Summary: (MyChart) Due to this being a telephonic visit,  the after visit summary with patients personalized plan was offered to patient via MyChart   Notes: Nothing significant to report at this time.

## 2023-06-23 NOTE — Patient Instructions (Addendum)
 [name] , Thank you for taking time to come for your Medicare Wellness Visit. I appreciate your ongoing commitment to your health goals. Please review the following plan we discussed and let me know if I can assist you in the future.   Referrals/Orders/Follow-Ups/Clinician Recommendations: Aim for 30 minutes of exercise or brisk walking, 6-8 glasses of water, and 5 servings of fruits and vegetables each day.   This is a list of the screening recommended for you and due dates:  Health Maintenance  Topic Date Due   COVID-19 Vaccine (5 - 2024-25 season) 11/13/2022   Eye exam for diabetics  07/29/2023   Hemoglobin A1C  08/02/2023   Flu Shot  10/13/2023   Yearly kidney function blood test for diabetes  02/02/2024   Yearly kidney health urinalysis for diabetes  02/02/2024   Complete foot exam   02/02/2024   Cologuard (Stool DNA test)  06/03/2024   Medicare Annual Wellness Visit  06/22/2024   DTaP/Tdap/Td vaccine (3 - Td or Tdap) 05/14/2030   Pneumonia Vaccine  Completed   Hepatitis C Screening  Completed   Zoster (Shingles) Vaccine  Completed   HPV Vaccine  Aged Out   Meningitis B Vaccine  Aged Out    Advanced directives: (Provided) Advance directive discussed with you today. I have provided a copy for you to complete at home and have notarized. Once this is complete, please bring a copy in to our office so we can scan it into your chart.   Next Medicare Annual Wellness Visit scheduled for next year: Yes

## 2023-07-01 ENCOUNTER — Other Ambulatory Visit: Payer: Self-pay | Admitting: Internal Medicine

## 2023-07-01 DIAGNOSIS — E118 Type 2 diabetes mellitus with unspecified complications: Secondary | ICD-10-CM

## 2023-07-06 ENCOUNTER — Other Ambulatory Visit: Payer: Self-pay | Admitting: Internal Medicine

## 2023-07-06 ENCOUNTER — Encounter: Payer: Self-pay | Admitting: Internal Medicine

## 2023-07-06 DIAGNOSIS — Z794 Long term (current) use of insulin: Secondary | ICD-10-CM

## 2023-07-06 MED ORDER — EMPAGLIFLOZIN 25 MG PO TABS
25.0000 mg | ORAL_TABLET | Freq: Every day | ORAL | 0 refills | Status: DC
Start: 2023-07-06 — End: 2023-08-19

## 2023-07-21 ENCOUNTER — Other Ambulatory Visit: Payer: Self-pay | Admitting: Internal Medicine

## 2023-07-21 DIAGNOSIS — E119 Type 2 diabetes mellitus without complications: Secondary | ICD-10-CM

## 2023-07-29 ENCOUNTER — Other Ambulatory Visit: Payer: Self-pay | Admitting: Internal Medicine

## 2023-07-29 DIAGNOSIS — I10 Essential (primary) hypertension: Secondary | ICD-10-CM

## 2023-07-29 DIAGNOSIS — E118 Type 2 diabetes mellitus with unspecified complications: Secondary | ICD-10-CM

## 2023-08-11 LAB — HM DIABETES EYE EXAM

## 2023-08-17 ENCOUNTER — Ambulatory Visit: Admitting: Internal Medicine

## 2023-08-17 ENCOUNTER — Encounter: Payer: Self-pay | Admitting: Internal Medicine

## 2023-08-17 VITALS — BP 138/88 | HR 74 | Temp 97.6°F | Resp 16 | Ht 70.0 in | Wt 168.8 lb

## 2023-08-17 DIAGNOSIS — I10 Essential (primary) hypertension: Secondary | ICD-10-CM | POA: Diagnosis not present

## 2023-08-17 DIAGNOSIS — E118 Type 2 diabetes mellitus with unspecified complications: Secondary | ICD-10-CM | POA: Diagnosis not present

## 2023-08-17 DIAGNOSIS — E039 Hypothyroidism, unspecified: Secondary | ICD-10-CM

## 2023-08-17 DIAGNOSIS — E785 Hyperlipidemia, unspecified: Secondary | ICD-10-CM

## 2023-08-17 DIAGNOSIS — Z794 Long term (current) use of insulin: Secondary | ICD-10-CM | POA: Diagnosis not present

## 2023-08-17 DIAGNOSIS — E119 Type 2 diabetes mellitus without complications: Secondary | ICD-10-CM

## 2023-08-17 LAB — CBC WITH DIFFERENTIAL/PLATELET
Basophils Absolute: 0.1 10*3/uL (ref 0.0–0.1)
Basophils Relative: 1.3 % (ref 0.0–3.0)
Eosinophils Absolute: 0.1 10*3/uL (ref 0.0–0.7)
Eosinophils Relative: 2.8 % (ref 0.0–5.0)
HCT: 46.4 % (ref 39.0–52.0)
Hemoglobin: 15.6 g/dL (ref 13.0–17.0)
Lymphocytes Relative: 26.6 % (ref 12.0–46.0)
Lymphs Abs: 1.3 10*3/uL (ref 0.7–4.0)
MCHC: 33.6 g/dL (ref 30.0–36.0)
MCV: 89 fl (ref 78.0–100.0)
Monocytes Absolute: 0.3 10*3/uL (ref 0.1–1.0)
Monocytes Relative: 6 % (ref 3.0–12.0)
Neutro Abs: 3 10*3/uL (ref 1.4–7.7)
Neutrophils Relative %: 63.3 % (ref 43.0–77.0)
Platelets: 187 10*3/uL (ref 150.0–400.0)
RBC: 5.22 Mil/uL (ref 4.22–5.81)
RDW: 13.2 % (ref 11.5–15.5)
WBC: 4.8 10*3/uL (ref 4.0–10.5)

## 2023-08-17 LAB — HEMOGLOBIN A1C: Hgb A1c MFr Bld: 8.3 % — ABNORMAL HIGH (ref 4.6–6.5)

## 2023-08-17 NOTE — Patient Instructions (Signed)

## 2023-08-17 NOTE — Progress Notes (Unsigned)
 Subjective:  Patient ID: Clifford Brown, male    DOB: 28-Dec-1950  Age: 73 y.o. MRN: [id number]  CC: Hypertension, Diabetes, and Hyperlipidemia   HPI Branden Shallenberger presents for f/up ----  Discussed the use of AI scribe software for clinical note transcription with the patient, who gave verbal consent to proceed.  History of Present Illness   Clifford Brown "Gene" is a 73 year old male who presents for a routine check-up and EKG.  He has no current health issues and feels well overall. He acknowledges being less active than desired but plans to use his bicycle more frequently as the weather improves. No chest pain, shortness of breath, dizziness, or lightheadedness during physical activity.  He had an eye exam last week with no reported issues. No symptoms related to blood pressure such as headaches, blurred vision, or swelling in the legs or feet. Occasional aches and pains are attributed to damp weather.  He works part-time at [employer], which he finds physically engaging and enjoyable. He works two days a week for five hours each day and has been with [employer] for nearly six years.       Outpatient Medications Prior to Visit  Medication Sig Dispense Refill   insulin  glargine (LANTUS  SOLOSTAR) 100 UNIT/ML Solostar Pen INJECT 20 UNITS INTO THE SKIN DAILY 15 mL 1   telmisartan  (MICARDIS ) 40 MG tablet TAKE 1 TABLET BY MOUTH EVERY DAY 90 tablet 1   B-D ULTRAFINE III SHORT PEN 31G X 8 MM MISC USE DAILY WITH INSULIN  100 each 1   Continuous Glucose Receiver (DEXCOM G6 RECEIVER) DEVI 1 Act by Does not apply route daily. 1 each 5   Continuous Glucose Sensor (DEXCOM G6 SENSOR) MISC 1 Act by Does not apply route daily. 1 each 5   Continuous Glucose Transmitter (DEXCOM G6 TRANSMITTER) MISC 1 Act by Does not apply route daily. 1 each 5   empagliflozin  (JARDIANCE ) 25 MG TABS tablet Take 1 tablet (25 mg total) by mouth daily. 90 tablet 0   metFORMIN  (GLUCOPHAGE -XR) 750 MG 24 hr tablet Take 2 tablets (1,500  mg total) by mouth daily with breakfast. 180 tablet 1   rosuvastatin  (CRESTOR ) 10 MG tablet Take 1 tablet (10 mg total) by mouth daily. 90 tablet 1   Semaglutide  (RYBELSUS ) 14 MG TABS Take 1 tablet (14 mg total) by mouth daily. 90 tablet 1   No facility-administered medications prior to visit.    ROS Review of Systems  Objective:  BP 138/88 (BP Location: Left Arm, Patient Position: Sitting, Cuff Size: Normal)   Pulse 74   Temp 97.6 F (36.4 C) (Oral)   Resp 16   Ht 5\' 10"  (1.778 m)   Wt 168 lb 12.8 oz (76.6 kg)   SpO2 99%   BMI 24.22 kg/m   BP Readings from Last 3 Encounters:  08/17/23 138/88  02/02/23 (!) 140/82  07/28/22 (!) 142/86    Wt Readings from Last 3 Encounters:  08/17/23 168 lb 12.8 oz (76.6 kg)  06/23/23 169 lb (76.7 kg)  02/02/23 169 lb 6.4 oz (76.8 kg)    Physical Exam Vitals reviewed.  Cardiovascular:     Comments: EKG--- NSR, 82 bpm No LVH, Q waves, or ST/T wave changes  Musculoskeletal:     Right lower leg: No edema.     Left lower leg: No edema.     Lab Results  Component Value Date   WBC 4.8 08/17/2023   HGB 15.6 08/17/2023   HCT 46.4 08/17/2023  PLT 187.0 08/17/2023   GLUCOSE 127 (H) 08/17/2023   CHOL 143 08/17/2023   TRIG 91.0 08/17/2023   HDL 74.70 08/17/2023   LDLDIRECT 183.6 01/31/2011   LDLCALC 50 08/17/2023   ALT 22 08/17/2023   AST 19 08/17/2023   NA 138 08/17/2023   K 4.1 08/17/2023   CL 103 08/17/2023   CREATININE 1.15 08/17/2023   BUN 16 08/17/2023   CO2 25 08/17/2023   TSH 6.31 (H) 08/17/2023   PSA 0.00 (L) 07/28/2022   HGBA1C 8.3 (H) 08/17/2023   MICROALBUR 0.8 08/17/2023    DG Chest 2 View Result Date: 04/10/2018 CLINICAL DATA:  Cough EXAM: CHEST - 2 VIEW COMPARISON:  12/03/2008 FINDINGS: Heart and mediastinal contours are within normal limits. No focal opacities or effusions. No acute bony abnormality. IMPRESSION: No active cardiopulmonary disease. Electronically Signed   By: Janeece Mechanic M.D.   On: 04/10/2018  17:15    Assessment & Plan:  Acquired hypothyroidism -     TSH; Future  Type 2 diabetes mellitus with complication, with long-term current use of insulin  (HCC) -     Microalbumin / creatinine urine ratio; Future -     Hemoglobin A1c; Future -     Basic metabolic panel with GFR; Future -     Empagliflozin ; Take 1 tablet (25 mg total) by mouth daily.  Dispense: 90 tablet; Refill: 1 -     metFORMIN  HCl ER; Take 2 tablets (1,500 mg total) by mouth daily with breakfast.  Dispense: 180 tablet; Refill: 1 -     Rybelsus ; Take 1 tablet (14 mg total) by mouth daily.  Dispense: 90 tablet; Refill: 1  Dyslipidemia (high LDL; low HDL) -     Lipid panel; Future -     Hepatic function panel; Future -     CT CARDIAC SCORING ([facility] LOCATIONS ONLY); Future -     Rosuvastatin  Calcium ; Take 1 tablet (10 mg total) by mouth daily.  Dispense: 90 tablet; Refill: 1  Essential hypertension, benign -     TSH; Future -     Urinalysis, Routine w reflex microscopic; Future -     CBC with Differential/Platelet; Future -     Basic metabolic panel with GFR; Future -     EKG 12-Lead  Insulin -requiring or dependent type II diabetes mellitus (HCC) -     FreeStyle Libre 3 Plus Sensor; Apply 1 Act topically every 14 (fourteen) days. Change sensor every 15 days.  Dispense: 6 each; Refill: 1     Follow-up: Return in about 6 months (around 02/16/2024).  Clifford Crouch, MD

## 2023-08-18 ENCOUNTER — Other Ambulatory Visit: Payer: Self-pay | Admitting: Internal Medicine

## 2023-08-18 ENCOUNTER — Ambulatory Visit: Payer: Self-pay | Admitting: Internal Medicine

## 2023-08-18 DIAGNOSIS — Z794 Long term (current) use of insulin: Secondary | ICD-10-CM

## 2023-08-18 LAB — BASIC METABOLIC PANEL WITH GFR
BUN: 16 mg/dL (ref 6–23)
CO2: 25 meq/L (ref 19–32)
Calcium: 9.3 mg/dL (ref 8.4–10.5)
Chloride: 103 meq/L (ref 96–112)
Creatinine, Ser: 1.15 mg/dL (ref 0.40–1.50)
GFR: 63.42 mL/min (ref >60.00)
Glucose, Bld: 127 mg/dL — ABNORMAL HIGH (ref 70–99)
Potassium: 4.1 meq/L (ref 3.5–5.1)
Sodium: 138 meq/L (ref 135–145)

## 2023-08-18 LAB — URINALYSIS, ROUTINE W REFLEX MICROSCOPIC
Bilirubin Urine: NEGATIVE
Hgb urine dipstick: NEGATIVE
Ketones, ur: NEGATIVE
Leukocytes,Ua: NEGATIVE
Nitrite: NEGATIVE
RBC / HPF: NONE SEEN (ref 0–?)
Specific Gravity, Urine: 1.01 (ref 1.000–1.030)
Total Protein, Urine: NEGATIVE
Urine Glucose: >=1000 — AB
Urobilinogen, UA: 0.2 (ref 0.0–1.0)
pH: 6 (ref 5.0–8.0)

## 2023-08-18 LAB — MICROALBUMIN / CREATININE URINE RATIO
Creatinine,U: 53.5 mg/dL
Microalb Creat Ratio: 14.8 mg/g (ref 0.0–30.0)
Microalb, Ur: 0.8 mg/dL (ref 0.0–1.9)

## 2023-08-18 LAB — LIPID PANEL
Cholesterol: 143 mg/dL (ref 0–200)
HDL: 74.7 mg/dL (ref >39.00)
LDL Cholesterol: 50 mg/dL (ref 0–99)
NonHDL: 67.85
Total CHOL/HDL Ratio: 2
Triglycerides: 91 mg/dL (ref 0.0–149.0)
VLDL: 18.2 mg/dL (ref 0.0–40.0)

## 2023-08-18 LAB — HEPATIC FUNCTION PANEL
ALT: 22 U/L (ref 0–53)
AST: 19 U/L (ref 0–37)
Albumin: 4.3 g/dL (ref 3.5–5.2)
Alkaline Phosphatase: 63 U/L (ref 39–117)
Bilirubin, Direct: 0 mg/dL (ref 0.0–0.3)
Total Bilirubin: 0.4 mg/dL (ref 0.2–1.2)
Total Protein: 6.9 g/dL (ref 6.0–8.3)

## 2023-08-18 LAB — TSH: TSH: 6.31 u[IU]/mL — ABNORMAL HIGH (ref 0.35–5.50)

## 2023-08-19 ENCOUNTER — Encounter: Payer: Self-pay | Admitting: Internal Medicine

## 2023-08-19 MED ORDER — FREESTYLE LIBRE 3 PLUS SENSOR MISC: 1.0000 | 1 refills | Status: DC

## 2023-08-19 MED ORDER — EMPAGLIFLOZIN 25 MG PO TABS: 25.0000 mg | ORAL_TABLET | Freq: Every day | ORAL | 1 refills | Status: DC

## 2023-08-19 MED ORDER — METFORMIN HCL ER 750 MG PO TB24: 1500.0000 mg | ORAL_TABLET | Freq: Every day | ORAL | 1 refills | Status: DC

## 2023-08-19 MED ORDER — ROSUVASTATIN CALCIUM 10 MG PO TABS: 10.0000 mg | ORAL_TABLET | Freq: Every day | ORAL | 1 refills | Status: DC

## 2023-08-19 MED ORDER — RYBELSUS 14 MG PO TABS
14.0000 mg | ORAL_TABLET | Freq: Every day | ORAL | 1 refills | Status: DC
Start: 2023-08-19 — End: 2024-02-05

## 2023-08-24 ENCOUNTER — Other Ambulatory Visit: Payer: Self-pay | Admitting: Internal Medicine

## 2023-08-24 DIAGNOSIS — Z794 Long term (current) use of insulin: Secondary | ICD-10-CM

## 2023-08-24 DIAGNOSIS — E118 Type 2 diabetes mellitus with unspecified complications: Secondary | ICD-10-CM

## 2023-08-24 MED ORDER — DEXCOM G7 SENSOR MISC: 1.0000 | Freq: Every day | 1 refills | Status: DC

## 2023-08-24 MED ORDER — DEXCOM G7 RECEIVER DEVI: 1.0000 | Freq: Every day | 1 refills | Status: DC

## 2023-08-24 MED ORDER — DEXCOM G7 SENSOR MISC
1.0000 | Freq: Every day | 1 refills | Status: DC
Start: 2023-08-24 — End: 2023-09-22

## 2023-08-29 ENCOUNTER — Ambulatory Visit
Admission: RE | Admit: 2023-08-29 | Discharge: 2023-08-29 | Disposition: A | Discharge status: Home / Self Care | Source: Ambulatory Visit | Attending: Internal Medicine | Admitting: Internal Medicine

## 2023-08-29 DIAGNOSIS — E785 Hyperlipidemia, unspecified: Secondary | ICD-10-CM

## 2023-09-21 ENCOUNTER — Encounter: Payer: Self-pay | Admitting: Internal Medicine

## 2023-09-22 ENCOUNTER — Other Ambulatory Visit: Payer: Self-pay

## 2023-09-22 DIAGNOSIS — Z794 Long term (current) use of insulin: Secondary | ICD-10-CM

## 2023-09-22 DIAGNOSIS — E118 Type 2 diabetes mellitus with unspecified complications: Secondary | ICD-10-CM

## 2023-09-22 MED ORDER — DEXCOM G7 SENSOR MISC: 1.0000 | Freq: Every day | 1 refills | Status: DC

## 2023-09-22 MED ORDER — DEXCOM G7 RECEIVER DEVI: 1.0000 | Freq: Every day | 1 refills | Status: AC

## 2023-12-23 ENCOUNTER — Other Ambulatory Visit: Payer: Self-pay | Admitting: Internal Medicine

## 2023-12-23 DIAGNOSIS — E119 Type 2 diabetes mellitus without complications: Secondary | ICD-10-CM

## 2023-12-26 ENCOUNTER — Encounter: Payer: Self-pay | Admitting: Internal Medicine

## 2023-12-27 ENCOUNTER — Other Ambulatory Visit: Payer: Self-pay

## 2023-12-27 DIAGNOSIS — E119 Type 2 diabetes mellitus without complications: Secondary | ICD-10-CM

## 2023-12-27 MED ORDER — LANTUS SOLOSTAR 100 UNIT/ML ~~LOC~~ SOPN: 20.0000 [IU] | PEN_INJECTOR | Freq: Every day | SUBCUTANEOUS | 0 refills | Status: DC

## 2024-01-03 ENCOUNTER — Encounter: Payer: Self-pay | Admitting: Internal Medicine

## 2024-01-04 ENCOUNTER — Other Ambulatory Visit: Payer: Self-pay

## 2024-01-04 ENCOUNTER — Other Ambulatory Visit: Payer: Self-pay | Admitting: Internal Medicine

## 2024-01-04 DIAGNOSIS — E118 Type 2 diabetes mellitus with unspecified complications: Secondary | ICD-10-CM

## 2024-01-04 DIAGNOSIS — Z794 Long term (current) use of insulin: Secondary | ICD-10-CM

## 2024-01-04 MED ORDER — DEXCOM G7 SENSOR MISC: 1.0000 | Freq: Every day | 1 refills | Status: AC

## 2024-01-04 MED ORDER — DEXCOM G7 SENSOR MISC
1.0000 | Freq: Every day | 1 refills | Status: DC
Start: 2024-01-04 — End: 2024-01-04

## 2024-01-24 ENCOUNTER — Other Ambulatory Visit: Payer: Self-pay | Admitting: Internal Medicine

## 2024-01-24 DIAGNOSIS — E118 Type 2 diabetes mellitus with unspecified complications: Secondary | ICD-10-CM

## 2024-01-24 DIAGNOSIS — I10 Essential (primary) hypertension: Secondary | ICD-10-CM

## 2024-01-30 ENCOUNTER — Other Ambulatory Visit: Payer: Self-pay | Admitting: Medical Genetics

## 2024-02-03 ENCOUNTER — Other Ambulatory Visit: Payer: Self-pay | Admitting: Internal Medicine

## 2024-02-03 DIAGNOSIS — Z794 Long term (current) use of insulin: Secondary | ICD-10-CM

## 2024-02-06 ENCOUNTER — Encounter: Payer: Self-pay | Admitting: Internal Medicine

## 2024-02-06 ENCOUNTER — Ambulatory Visit: Admitting: Internal Medicine

## 2024-02-06 ENCOUNTER — Ambulatory Visit: Payer: Self-pay | Admitting: Internal Medicine

## 2024-02-06 VITALS — BP 130/78 | HR 78 | Temp 97.9°F | Ht 70.0 in | Wt 180.2 lb

## 2024-02-06 DIAGNOSIS — Z794 Long term (current) use of insulin: Secondary | ICD-10-CM

## 2024-02-06 DIAGNOSIS — E039 Hypothyroidism, unspecified: Secondary | ICD-10-CM

## 2024-02-06 DIAGNOSIS — E118 Type 2 diabetes mellitus with unspecified complications: Secondary | ICD-10-CM

## 2024-02-06 DIAGNOSIS — Z125 Encounter for screening for malignant neoplasm of prostate: Secondary | ICD-10-CM | POA: Diagnosis not present

## 2024-02-06 DIAGNOSIS — Z0000 Encounter for general adult medical examination without abnormal findings: Secondary | ICD-10-CM

## 2024-02-06 DIAGNOSIS — E785 Hyperlipidemia, unspecified: Secondary | ICD-10-CM

## 2024-02-06 DIAGNOSIS — Z0001 Encounter for general adult medical examination with abnormal findings: Secondary | ICD-10-CM

## 2024-02-06 DIAGNOSIS — I10 Essential (primary) hypertension: Secondary | ICD-10-CM | POA: Diagnosis not present

## 2024-02-06 LAB — HEPATIC FUNCTION PANEL
ALT: 21 U/L (ref 0–53)
AST: 17 U/L (ref 0–37)
Albumin: 4.4 g/dL (ref 3.5–5.2)
Alkaline Phosphatase: 67 U/L (ref 39–117)
Bilirubin, Direct: 0.1 mg/dL (ref 0.0–0.3)
Total Bilirubin: 0.7 mg/dL (ref 0.2–1.2)
Total Protein: 6.6 g/dL (ref 6.0–8.3)

## 2024-02-06 LAB — PSA: PSA: 0 ng/mL — ABNORMAL LOW (ref 0.10–4.00)

## 2024-02-06 LAB — CBC WITH DIFFERENTIAL/PLATELET
Basophils Absolute: 0.1 K/uL (ref 0.0–0.1)
Basophils Relative: 1.2 % (ref 0.0–3.0)
Eosinophils Absolute: 0.1 K/uL (ref 0.0–0.7)
Eosinophils Relative: 2.8 % (ref 0.0–5.0)
HCT: 46.3 % (ref 39.0–52.0)
Hemoglobin: 15.6 g/dL (ref 13.0–17.0)
Lymphocytes Relative: 23.1 % (ref 12.0–46.0)
Lymphs Abs: 1.2 K/uL (ref 0.7–4.0)
MCHC: 33.6 g/dL (ref 30.0–36.0)
MCV: 89.6 fl (ref 78.0–100.0)
Monocytes Absolute: 0.4 K/uL (ref 0.1–1.0)
Monocytes Relative: 7.9 % (ref 3.0–12.0)
Neutro Abs: 3.2 K/uL (ref 1.4–7.7)
Neutrophils Relative %: 65 % (ref 43.0–77.0)
Platelets: 183 K/uL (ref 150.0–400.0)
RBC: 5.17 Mil/uL (ref 4.22–5.81)
RDW: 13.2 % (ref 11.5–15.5)
WBC: 5 K/uL (ref 4.0–10.5)

## 2024-02-06 LAB — BASIC METABOLIC PANEL WITH GFR
BUN: 19 mg/dL (ref 6–23)
CO2: 23 meq/L (ref 19–32)
Calcium: 9.4 mg/dL (ref 8.4–10.5)
Chloride: 105 meq/L (ref 96–112)
Creatinine, Ser: 1.01 mg/dL (ref 0.40–1.50)
GFR: 73.87 mL/min (ref >60.00)
Glucose, Bld: 139 mg/dL — ABNORMAL HIGH (ref 70–99)
Potassium: 4.4 meq/L (ref 3.5–5.1)
Sodium: 135 meq/L (ref 135–145)

## 2024-02-06 LAB — HEMOGLOBIN A1C: Hgb A1c MFr Bld: 8.1 % — ABNORMAL HIGH (ref 4.6–6.5)

## 2024-02-06 LAB — TSH: TSH: 6.48 u[IU]/mL — ABNORMAL HIGH (ref 0.35–5.50)

## 2024-02-06 MED ORDER — ROSUVASTATIN CALCIUM 10 MG PO TABS: 10.0000 mg | ORAL_TABLET | Freq: Every day | ORAL | 1 refills | Status: AC

## 2024-02-06 MED ORDER — EMPAGLIFLOZIN 25 MG PO TABS: 25.0000 mg | ORAL_TABLET | Freq: Every day | ORAL | 1 refills | Status: AC

## 2024-02-06 NOTE — Progress Notes (Signed)
 Subjective:  Patient ID: Clifford Brown, male    DOB: 10-10-1950  Age: 73 y.o. MRN: [id number]  CC: Annual Exam, Diabetes, and Hypertension   HPI Doug Bucklin presents for a CPX and f/up ----  Discussed the use of AI scribe software for clinical note transcription with the patient, who gave verbal consent to proceed.  History of Present Illness Clifford Brown Gene is a 73 year old male with diabetes who presents for a routine follow-up visit.  He remains active, working a couple of days a week, although the weather sometimes limits his activity. No chest pain, shortness of breath, dizziness, or lightheadedness during physical activity.  He occasionally experiences low blood sugar in the morning, which he manages by eating fruit. He uses a G7 glucose monitor and has had only one or two high blood sugar readings in the past six months, typically occurring after meals. His current blood sugar reading is 158 mg/dL.  No concerns about his blood pressure. He received a flu shot about a month ago but has not received a COVID vaccine recently.  No abdominal pain, nausea, vomiting, or symptoms related to his thyroid  medication dose. No urinary symptoms or signs of prostate issues.    Outpatient Medications Prior to Visit  Medication Sig Dispense Refill   Continuous Glucose Receiver (DEXCOM G7 RECEIVER) DEVI 1 Act by Does not apply route daily. 9 each 1   Continuous Glucose Sensor (DEXCOM G7 SENSOR) MISC 1 Act by Does not apply route daily. 9 each 1   insulin  glargine (LANTUS  SOLOSTAR) 100 UNIT/ML Solostar Pen Inject 20 Units into the skin daily. 18 mL 0   Insulin  Pen Needle (BD PEN NEEDLE SHORT ULTRAFINE) 31G X 8 MM MISC USE DAILY WITH INSULIN  100 each 3   metFORMIN  (GLUCOPHAGE -XR) 750 MG 24 hr tablet Take 2 tablets (1,500 mg total) by mouth daily with breakfast. 180 tablet 1   RYBELSUS  14 MG TABS TAKE 1 TABLET (14 MG TOTAL) BY MOUTH DAILY 90 tablet 1   telmisartan  (MICARDIS ) 40 MG  tablet TAKE 1 TABLET BY MOUTH EVERY DAY 90 tablet 1   empagliflozin  (JARDIANCE ) 25 MG TABS tablet Take 1 tablet (25 mg total) by mouth daily. 90 tablet 1   rosuvastatin  (CRESTOR ) 10 MG tablet Take 1 tablet (10 mg total) by mouth daily. 90 tablet 1   No facility-administered medications prior to visit.    ROS Review of Systems  Constitutional:  Negative for appetite change, chills, diaphoresis, fatigue and fever.  HENT: Negative.  Negative for sore throat and trouble swallowing.   Eyes: Negative.   Respiratory:  Negative for cough, chest tightness, shortness of breath and wheezing.   Cardiovascular:  Negative for chest pain, palpitations and leg swelling.  Gastrointestinal: Negative.  Negative for abdominal pain, constipation, diarrhea, nausea and vomiting.  Endocrine: Negative.   Genitourinary: Negative.  Negative for difficulty urinating.  Musculoskeletal: Negative.  Negative for arthralgias and myalgias.  Skin: Negative.   Neurological:  Negative for dizziness, weakness, light-headedness and headaches.  Hematological:  Negative for adenopathy. Does not bruise/bleed easily.  Psychiatric/Behavioral: Negative.      Objective:  BP 130/78   Pulse 78   Temp 97.9 F (36.6 C) (Oral)   Ht 5' 10 (1.778 m)   Wt 180 lb 3.2 oz (81.7 kg)   SpO2 97%   BMI 25.86 kg/m   BP Readings from Last 3 Encounters:  02/06/24 130/78  08/17/23 138/88  02/02/23 (!) 140/82  Wt Readings from Last 3 Encounters:  02/06/24 180 lb 3.2 oz (81.7 kg)  08/17/23 168 lb 12.8 oz (76.6 kg)  06/23/23 169 lb (76.7 kg)    Physical Exam Vitals reviewed.  Constitutional:      Appearance: Normal appearance.  HENT:     Nose: Nose normal.     Mouth/Throat:     Mouth: Mucous membranes are moist.  Eyes:     General: No scleral icterus.    Conjunctiva/sclera: Conjunctivae normal.  Cardiovascular:     Rate and Rhythm: Normal rate and regular rhythm.     Heart sounds: No murmur heard.    No friction rub. No  gallop.  Pulmonary:     Effort: Pulmonary effort is normal.     Breath sounds: No stridor. No wheezing, rhonchi or rales.  Abdominal:     General: Abdomen is flat.     Palpations: There is no mass.     Tenderness: There is no abdominal tenderness. There is no guarding.     Hernia: No hernia is present.  Musculoskeletal:        General: Normal range of motion.     Cervical back: Neck supple.     Right lower leg: No edema.     Left lower leg: No edema.  Lymphadenopathy:     Cervical: No cervical adenopathy.  Skin:    General: Skin is warm and dry.  Neurological:     General: No focal deficit present.     Mental Status: He is alert.  Psychiatric:        Mood and Affect: Mood normal.        Behavior: Behavior normal.     Lab Results  Component Value Date   WBC 5.0 02/06/2024   HGB 15.6 02/06/2024   HCT 46.3 02/06/2024   PLT 183.0 02/06/2024   GLUCOSE 139 (H) 02/06/2024   CHOL 143 08/17/2023   TRIG 91.0 08/17/2023   HDL 74.70 08/17/2023   LDLDIRECT 183.6 01/31/2011   LDLCALC 50 08/17/2023   ALT 21 02/06/2024   AST 17 02/06/2024   NA 135 02/06/2024   K 4.4 02/06/2024   CL 105 02/06/2024   CREATININE 1.01 02/06/2024   BUN 19 02/06/2024   CO2 23 02/06/2024   TSH 6.48 (H) 02/06/2024   PSA 0.00 (L) 02/06/2024   HGBA1C 8.1 (H) 02/06/2024   MICROALBUR 0.8 08/17/2023    CT CARDIAC SCORING ([facility] LOCATIONS ONLY) Result Date: 09/04/2023 CLINICAL DATA:  Elevated cholesterol.  Diabetic. * Tracking Code: FCC * EXAM: CT CARDIAC CORONARY ARTERY CALCIUM  SCORE TECHNIQUE: Non-contrast imaging through the heart was performed using prospective ECG gating. Image post processing was performed on an independent workstation, allowing for quantitative analysis of the heart and coronary arteries. Note that this exam targets the heart and the chest was not imaged in its entirety. COMPARISON:  None available. FINDINGS: CORONARY CALCIUM  SCORES: Left Main: 0 LAD: 5.6 LCx: 0 RCA/PDA: 21.6 Total  Agatston Score: 27.2 MESA database percentile: 23 AORTA MEASUREMENTS: Ascending Aorta: 3.7 cm Descending Aorta:2.7 cm OTHER FINDINGS: No pleural fluid. Inferior right upper lobe 3 mm pulmonary nodule on 09/04. Left lower lobe pulmonary nodules of maximally 4 mm. Aortic atherosclerosis. Normal heart size, without pericardial effusion. No imaged thoracic adenopathy. Mild hepatic steatosis. Normal imaged portions of the spleen, stomach, adrenal glands. No acute osseous abnormality. IMPRESSION: 1. Total Agatston score of 27.2, corresponding to 23rd percentile for age, sex, and race based cohort. 2. Hepatic steatosis. 3. Pulmonary nodules  of maximally 4 mm. No follow-up needed if patient is low-risk. Non-contrast chest CT can be considered in 12 months if patient is high-risk, nodule is upper lobe, and/or suspicious in morphology. This recommendation follows the consensus statement: Guidelines for Management of Incidental Pulmonary Nodules Detected on CT Images: From the Fleischner Society 2017; Radiology 2017; 284:228-243. 4.  Aortic Atherosclerosis (ICD10-I70.0). Electronically Signed   By: Rockey Kilts M.D.   On: 09/04/2023 13:09    Assessment & Plan:   Acquired hypothyroidism- He has subclinical HypoT. -     CBC with Differential/Platelet; Future -     TSH; Future  Type 2 diabetes mellitus with complication, with long-term current use of insulin  (HCC)- A1C is down to 8.1%. -     Basic metabolic panel with GFR; Future -     Hemoglobin A1c; Future -     HM Diabetes Foot Exam -     Hepatic function panel; Future -     Empagliflozin ; Take 1 tablet (25 mg total) by mouth daily.  Dispense: 90 tablet; Refill: 1  Essential hypertension, benign- BP is well controlled. -     CBC with Differential/Platelet; Future -     Hepatic function panel; Future  Encounter for general adult medical examination with abnormal findings- Exam completed, labs reviewed, vaccines reviewed, cancer screenings addressed, pt ed  material was given.   Prostate cancer screening -     PSA; Future  Dyslipidemia (high LDL; low HDL)- LDL goal achieved. Doing well on the statin  -     Hepatic function panel; Future -     Rosuvastatin  Calcium ; Take 1 tablet (10 mg total) by mouth daily.  Dispense: 90 tablet; Refill: 1     Follow-up: Return in about 6 months (around 08/05/2024).  Debby Molt, MD

## 2024-02-06 NOTE — Patient Instructions (Signed)
 Health Maintenance, Male  Adopting a healthy lifestyle and getting preventive care are important in promoting health and wellness. Ask your health care provider about:  The right schedule for you to have regular tests and exams.  Things you can do on your own to prevent diseases and keep yourself healthy.  What should I know about diet, weight, and exercise?  Eat a healthy diet    Eat a diet that includes plenty of vegetables, fruits, low-fat dairy products, and lean protein.  Do not eat a lot of foods that are high in solid fats, added sugars, or sodium.  Maintain a healthy weight  Body mass index (BMI) is a measurement that can be used to identify possible weight problems. It estimates body fat based on height and weight. Your health care provider can help determine your BMI and help you achieve or maintain a healthy weight.  Get regular exercise  Get regular exercise. This is one of the most important things you can do for your health. Most adults should:  Exercise for at least 150 minutes each week. The exercise should increase your heart rate and make you sweat (moderate-intensity exercise).  Do strengthening exercises at least twice a week. This is in addition to the moderate-intensity exercise.  Spend less time sitting. Even light physical activity can be beneficial.  Watch cholesterol and blood lipids  Have your blood tested for lipids and cholesterol at 73 years of age, then have this test every 5 years.  You may need to have your cholesterol levels checked more often if:  Your lipid or cholesterol levels are high.  You are older than 73 years of age.  You are at high risk for heart disease.  What should I know about cancer screening?  Many types of cancers can be detected early and may often be prevented. Depending on your health history and family history, you may need to have cancer screening at various ages. This may include screening for:  Colorectal cancer.  Prostate cancer.  Skin cancer.  Lung  cancer.  What should I know about heart disease, diabetes, and high blood pressure?  Blood pressure and heart disease  High blood pressure causes heart disease and increases the risk of stroke. This is more likely to develop in people who have high blood pressure readings or are overweight.  Talk with your health care provider about your target blood pressure readings.  Have your blood pressure checked:  Every 3-5 years if you are 73-52 years of age.  Every year if you are 73 years old or older.  If you are between the ages of 60 and 72 and are a current or former smoker, ask your health care provider if you should have a one-time screening for abdominal aortic aneurysm (AAA).  Diabetes  Have regular diabetes screenings. This checks your fasting blood sugar level. Have the screening done:  Once every three years after age 73 if you are at a normal weight and have a low risk for diabetes.  More often and at a younger age if you are overweight or have a high risk for diabetes.  What should I know about preventing infection?  Hepatitis B  If you have a higher risk for hepatitis B, you should be screened for this virus. Talk with your health care provider to find out if you are at risk for hepatitis B infection.  Hepatitis C  Blood testing is recommended for:  Everyone born from [birth year] through 1965.  Anyone  with known risk factors for hepatitis C.  Sexually transmitted infections (STIs)  You should be screened each year for STIs, including gonorrhea and chlamydia, if:  You are sexually active and are younger than 73 years of age.  You are older than 73 years of age and your health care provider tells you that you are at risk for this type of infection.  Your sexual activity has changed since you were last screened, and you are at increased risk for chlamydia or gonorrhea. Ask your health care provider if you are at risk.  Ask your health care provider about whether you are at high risk for HIV. Your health care provider  may recommend a prescription medicine to help prevent HIV infection. If you choose to take medicine to prevent HIV, you should first get tested for HIV. You should then be tested every 3 months for as long as you are taking the medicine.  Follow these instructions at home:  Alcohol use  Do not drink alcohol if your health care provider tells you not to drink.  If you drink alcohol:  Limit how much you have to 0-2 drinks a day.  Know how much alcohol is in your drink. In the [place] one drink equals one 12 oz bottle of beer (355 mL), one 5 oz glass of wine (148 mL), or one 1 oz glass of hard liquor (44 mL).  Lifestyle  Do not use any products that contain nicotine or tobacco. These products include cigarettes, chewing tobacco, and vaping devices, such as e-cigarettes. If you need help quitting, ask your health care provider.  Do not use street drugs.  Do not share needles.  Ask your health care provider for help if you need support or information about quitting drugs.  General instructions  Schedule regular health, dental, and eye exams.  Stay current with your vaccines.  Tell your health care provider if:  You often feel depressed.  You have ever been abused or do not feel safe at home.  Summary  Adopting a healthy lifestyle and getting preventive care are important in promoting health and wellness.  Follow your health care provider's instructions about healthy diet, exercising, and getting tested or screened for diseases.  Follow your health care provider's instructions on monitoring your cholesterol and blood pressure.  This information is not intended to replace advice given to you by your health care provider. Make sure you discuss any questions you have with your health care provider.  Document Revised: 07/20/2020 Document Reviewed: 07/20/2020  Elsevier Patient Education  2024 [employer].

## 2024-02-27 ENCOUNTER — Other Ambulatory Visit: Payer: Self-pay | Admitting: Internal Medicine

## 2024-02-27 DIAGNOSIS — E118 Type 2 diabetes mellitus with unspecified complications: Secondary | ICD-10-CM

## 2024-03-04 ENCOUNTER — Other Ambulatory Visit: Payer: Self-pay | Admitting: Internal Medicine

## 2024-03-04 DIAGNOSIS — E119 Type 2 diabetes mellitus without complications: Secondary | ICD-10-CM

## 2024-03-21 ENCOUNTER — Other Ambulatory Visit

## 2024-03-21 DIAGNOSIS — Z006 Encounter for examination for normal comparison and control in clinical research program: Secondary | ICD-10-CM

## 2024-03-29 LAB — GENECONNECT MOLECULAR SCREEN: Genetic Analysis Overall Interpretation: NEGATIVE

## 2024-06-25 ENCOUNTER — Ambulatory Visit

## 2024-07-25 ENCOUNTER — Ambulatory Visit
